# Patient Record
Sex: Female | Born: 1965 | Race: Black or African American | Hispanic: No | Marital: Single | Smoking: Current every day smoker
Health system: Southern US, Community
[De-identification: ages and names within clinical notes are randomized; demographics above are authoritative.]

## PROBLEM LIST (undated history)

## (undated) DIAGNOSIS — M1990 Unspecified osteoarthritis, unspecified site: Secondary | ICD-10-CM

## (undated) DIAGNOSIS — M549 Dorsalgia, unspecified: Secondary | ICD-10-CM

## (undated) DIAGNOSIS — E119 Type 2 diabetes mellitus without complications: Secondary | ICD-10-CM

## (undated) DIAGNOSIS — K219 Gastro-esophageal reflux disease without esophagitis: Secondary | ICD-10-CM

## (undated) DIAGNOSIS — F419 Anxiety disorder, unspecified: Secondary | ICD-10-CM

## (undated) DIAGNOSIS — I10 Essential (primary) hypertension: Secondary | ICD-10-CM

## (undated) DIAGNOSIS — E785 Hyperlipidemia, unspecified: Secondary | ICD-10-CM

## (undated) DIAGNOSIS — M6530 Trigger finger, unspecified finger: Secondary | ICD-10-CM

## (undated) DIAGNOSIS — F32A Depression, unspecified: Secondary | ICD-10-CM

## (undated) DIAGNOSIS — F329 Major depressive disorder, single episode, unspecified: Secondary | ICD-10-CM

## (undated) HISTORY — PX: JOINT REPLACEMENT: SHX530

## (undated) HISTORY — PX: TUBAL LIGATION: SHX77

---

## 2000-11-19 ENCOUNTER — Encounter: Admission: RE | Admit: 2000-11-19 | Discharge: 2000-11-19 | Payer: Self-pay | Admitting: Family Medicine

## 2002-07-24 ENCOUNTER — Encounter: Admission: RE | Admit: 2002-07-24 | Discharge: 2002-07-24 | Payer: Self-pay | Admitting: Family Medicine

## 2004-04-20 ENCOUNTER — Emergency Department (HOSPITAL_COMMUNITY): Admission: EM | Admit: 2004-04-20 | Discharge: 2004-04-20 | Payer: Self-pay | Admitting: Family Medicine

## 2009-06-03 ENCOUNTER — Emergency Department (HOSPITAL_COMMUNITY): Admission: EM | Admit: 2009-06-03 | Discharge: 2009-06-03 | Payer: Self-pay | Admitting: Emergency Medicine

## 2010-02-13 ENCOUNTER — Encounter: Payer: Self-pay | Admitting: Family Medicine

## 2010-03-07 ENCOUNTER — Ambulatory Visit: Payer: Self-pay | Admitting: Family Medicine

## 2010-03-07 DIAGNOSIS — M2550 Pain in unspecified joint: Secondary | ICD-10-CM | POA: Insufficient documentation

## 2010-03-07 DIAGNOSIS — R030 Elevated blood-pressure reading, without diagnosis of hypertension: Secondary | ICD-10-CM

## 2010-03-07 DIAGNOSIS — F17200 Nicotine dependence, unspecified, uncomplicated: Secondary | ICD-10-CM

## 2010-03-07 DIAGNOSIS — K6289 Other specified diseases of anus and rectum: Secondary | ICD-10-CM | POA: Insufficient documentation

## 2010-03-07 DIAGNOSIS — E669 Obesity, unspecified: Secondary | ICD-10-CM

## 2010-03-13 ENCOUNTER — Emergency Department (HOSPITAL_COMMUNITY): Admission: EM | Admit: 2010-03-13 | Discharge: 2010-03-13 | Payer: Self-pay | Admitting: Emergency Medicine

## 2010-08-05 ENCOUNTER — Encounter: Payer: Self-pay | Admitting: Family Medicine

## 2010-08-05 ENCOUNTER — Ambulatory Visit: Payer: Self-pay | Admitting: Family Medicine

## 2010-08-05 DIAGNOSIS — N898 Other specified noninflammatory disorders of vagina: Secondary | ICD-10-CM | POA: Insufficient documentation

## 2010-08-05 LAB — CONVERTED CEMR LAB
Chlamydia, DNA Probe: NEGATIVE
GC Probe Amp, Genital: NEGATIVE
Whiff Test: NEGATIVE

## 2010-08-13 ENCOUNTER — Encounter: Payer: Self-pay | Admitting: Family Medicine

## 2010-08-13 ENCOUNTER — Ambulatory Visit: Payer: Self-pay | Admitting: Family Medicine

## 2010-08-13 LAB — CONVERTED CEMR LAB
Cholesterol: 189 mg/dL (ref 0–200)
LDL Cholesterol: 114 mg/dL — ABNORMAL HIGH (ref 0–99)
Triglycerides: 118 mg/dL (ref <150)

## 2010-08-15 ENCOUNTER — Encounter: Payer: Self-pay | Admitting: Family Medicine

## 2010-08-18 ENCOUNTER — Ambulatory Visit: Payer: Self-pay | Admitting: Family Medicine

## 2010-11-20 ENCOUNTER — Ambulatory Visit: Payer: Self-pay | Admitting: Family Medicine

## 2010-11-20 DIAGNOSIS — M999 Biomechanical lesion, unspecified: Secondary | ICD-10-CM

## 2010-11-20 DIAGNOSIS — M25569 Pain in unspecified knee: Secondary | ICD-10-CM

## 2010-12-02 ENCOUNTER — Encounter: Payer: Self-pay | Admitting: *Deleted

## 2010-12-02 ENCOUNTER — Ambulatory Visit (HOSPITAL_COMMUNITY): Admission: RE | Admit: 2010-12-02 | Payer: Self-pay | Source: Home / Self Care | Admitting: Family Medicine

## 2010-12-31 ENCOUNTER — Encounter: Payer: Self-pay | Admitting: Family Medicine

## 2011-01-01 NOTE — Assessment & Plan Note (Signed)
Summary: NP,tcb   Vital Signs:  Patient profile:   45 year old female Height:      70 inches Weight:      299 pounds BMI:     43.06 BSA:     2.48 Temp:     98.6 degrees F Pulse rate:   78 / minute BP sitting:   137 / 90  Vitals Entered By: Jone Baseman CMA (March 07, 2010 1:44 PM) CC: Np-restablish care, rectal pain Is Patient Diabetic? No Pain Assessment Patient in pain? yes     Location: knees Intensity: 10   CC:  Np-restablish care and rectal pain.  History of Present Illness: here primarily to reestablish care.    rectal pain: main issue she wishes to discuss.  has noticed pressure that is intense - has occurred approximately 3-4 times and only happens about once every 3-4 months.  at first she thought maybe related to menstrual cycles but didn't happen with most recent cycle.  states the pain is sharp and causes sweating.  she feels like she needs to have a BM and then can't go.  once she did pass gass and felt much better thereafter. she does note she can feel when it is coming on.  typically the episodes last from 3-5 minutes.  she denies change in stool caliper.  denies any blood instool.  urination is normal, menstrual cycles regular.  she denies consipation or diarrhea  Habits & Providers  Alcohol-Tobacco-Diet     Alcohol drinks/day: 0     Tobacco Status: current     Tobacco Counseling: to quit use of tobacco products     Cigarette Packs/Day: 0.5     Year Started: since age 54     Year Quit: quit once for about 5 mo in 1992 and again in 2004 for 7 months  Exercise-Depression-Behavior     Drug Use: never  Current Medications (verified): 1)  Diclofenac Sodium 75 Mg Tbec (Diclofenac Sodium) .Marland Kitchen.. 1 By Mouth Two Times A Day For Arthritis Pains  Allergies (verified): No Known Drug Allergies  Past History:  Past medical, surgical, family and social histories (including risk factors) reviewed, and no changes noted (except as noted below).  Past Medical  History: s/p 6 NSVD at [facility], no complications arthritis? of knees and foot and wrist obesity  Past Surgical History: BTL 10/19/94  Family History: Reviewed history from 02/13/2010 and no changes required. Father - deceased age 48, s/p MI; HTN,  Mother - deceased at age 64 yo, type II DM ; HTN, CHF Kids healthy  Sister - DM2, HTN Brother - HTN other sibs healthy  no cancers in family  Social History: Reviewed history from 02/13/2010 and no changes required. Lives with daughters Melynda [place], [place]).  Has 4 other children (Shaqueena Gass, [place], [place]etta Hew)  - they live nearby. 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  denies chest pains, abdominal pains, shortness of breath, rash.  does endorce occasional heartburn with certain foods.   Physical Exam  General:  alert, well-developed, and well-hydrated.  obese Lungs:  Normal respiratory effort, chest expands symmetrically. Lungs are clear to auscultation, no crackles or wheezes. Heart:  Normal rate and regular rhythm. S1 and S2 normal without gallop, murmur, click,  rub or other extra sounds. Abdomen:  Bowel sounds positive,abdomen soft and non-tender without masses, organomegaly or hernias noted. Rectal:  No external abnormalities noted. Normal sphincter tone. No rectal masses or tenderness. Psych:  Cognition and judgment appear intact. Alert and cooperative with normal attention span and concentration. No apparent delusions, illusions, hallucinations   Impression & Recommendations:  Problem # 1:  RECTAL PAIN (ZOX-096.04) Assessment New unclear etiology at this point.  examination unrevealing.  ? gas pains.  monitor symptoms, try to come when having symptoms for repeat examination.   Problem # 2:   Preventive Health Care (ICD-V70.0) Assessment: Comment Only given handout for mammogram  Problem # 3:  TOBACCO ABUSE (ICD-305.1) Assessment: New given handout on smoking cessation classes. she is interested in quitting  Problem # 4:  ELEVATED BLOOD PRESSURE WITHOUT DIAGNOSIS OF HYPERTENSION (ICD-796.2) Assessment: New monitor.  has strong famiily hx.  may need treatment.  check labs at future appt as well - cholsterol, cbc, cmet, ?tsh  Complete Medication List: 1)  Diclofenac Sodium 75 Mg Tbec (Diclofenac sodium) .Marland Kitchen.. 1 by mouth two times a day for arthritis pains  Patient Instructions: 1)  Please follow up with me sometime in the next month or so to see how your new arthritis medicine is doing and to get your pap smear done.  2)  Please consider the smoking cessation classes here. 3)  The attached paper you can call to set up your mammogram. 4)  If anything changes with your rectal pain, please let me know. 5)  It was nice to meet you today.  Prescriptions: DICLOFENAC SODIUM 75 MG TBEC (DICLOFENAC SODIUM) 1 by mouth two times a day for arthritis pains  #60 x 3   Entered and Authorized by:   Ancil Boozer  MD   Signed by:   Ancil Boozer  MD on 03/07/2010   Method used:   Electronically to        [facility] [phone]* (retail)       [address]       [place], [place]  [address]       Ph: [phone]       Fax: [phone]   RxID:   [id number]   Prevention & Chronic Care Immunizations   Influenza vaccine: Not documented    Tetanus booster: Not documented    Pneumococcal vaccine: Not documented  Other Screening   Pap smear: Not documented    Mammogram: Not documented   Smoking status: current  (03/07/2010)  Lipids   Total Cholesterol: Not documented   LDL: Not documented   LDL Direct: Not documented   HDL: Not documented   Triglycerides: Not documented

## 2011-01-01 NOTE — Assessment & Plan Note (Signed)
Summary: cpe/pap,tcb   Vital Signs:  Patient profile:   45 year old female Weight:      294 pounds Temp:     98.2 degrees F oral Pulse rate:   87 / minute Pulse rhythm:   regular BP sitting:   137 / 100  (left arm) Cuff size:   large  Vitals Entered By: Loralee Pacas CMA (August 05, 2010 3:21 PM)  History of Present Illness: Pt. states that her knees have been bothering her a lot.  She says the diclofenac Dr. Sandi Mealy gave her at her last visit did not help her pain.  She says that she had seen the dentist and had a tooth pulled, and had some vicodin afterwards, and that and the diclofenac helped her knee pain.  She has had no inciting injury, and has had knee pain for many years, but it is worse lately.  Says it is on the inside of the knees, hurts worst from sitting to standing.  Makes walking difficult.   Pt. says she has no other complaints.  Pt says she is trying to watch her diet and quit smoking, but has not lost any weight or been able to completely stop smoking.    Allergies: No Known Drug Allergies  Family History: Reviewed history from 03/07/2010 and no changes required. Father - deceased age 70, s/p MI; HTN,  Mother - deceased at age 35 yo, type II DM ; HTN, CHF Kids healthy  Sister - DM2, HTN Brother - HTN other sibs healthy  no cancers in family  Social History: Reviewed history from 03/07/2010 and no changes required. Lives with daughters Melynda Keller, [place]).  Has 4 other children (Shaqueena Kemmerer, [place], Glenetta Hew)  - they live nearby. 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  The patient denies anorexia, chest pain, syncope, headaches, and abdominal pain.    Physical Exam  General:  Well-developed,well-nourished,in no acute distress; alert,appropriate and  cooperative throughout examination Eyes:  No corneal or conjunctival inflammation noted. EOMI. Perrla.  Mouth:  Oral mucosa and oropharynx without lesions or exudates.  Teeth in good repair. Neck:  No deformities, masses, or tenderness noted. Breasts:  No mass, nodules, thickening, tenderness, bulging, retraction, inflamation, nipple discharge or skin changes noted.   Lungs:  Normal respiratory effort, chest expands symmetrically. Lungs are clear to auscultation, no crackles or wheezes. Heart:  Normal rate and regular rhythm. S1 and S2 normal without gallop, murmur, click, rub or other extra sounds. Abdomen:  Obese, bowel sounds positive,abdomen soft and non-tender without masses, organomegaly or hernias noted. Genitalia:  Normal introitus for age, no external lesions,  minimal white vaginal discharge, mucosa pink and moist, no vaginal or cervical lesions, no vaginal atrophy, no friaility or hemorrhage, normal uterus size and position, no adnexal masses or tenderness, bimanual exam limited by body habitus.  Msk:  Bilateral knees: no joint swelling, no joint warmth, no redness over joints, + joint tenderness and + crepitus.   Pulses:  R and L carotid,radial,dorsalis pedis and posterior tibial pulses are full and equal bilaterally   Impression & Recommendations:  Problem # 1:  Gynecological examination-routine (ICD-V72.31) Normal female exam.  Will send Pap and GC/Chlamidya cultures.   Problem # 2:  PAIN IN JOINT, MULTIPLE SITES (ICD-719.49) Pt. likely has  osteoarthritis, in part due to her weight.  Will let her try Mobic for a limited time to see if she has relief.   Problem # 3:  ELEVATED BLOOD PRESSURE WITHOUT DIAGNOSIS OF HYPERTENSION (ICD-796.2) Pt. again with boarderline BP today.  I have asked her to keep a BP log and bring it in to her next visit to assess if she needs to be on meds.   Problem # 4:  OBESITY, UNSPECIFIED (ICD-278.00) I think this is contributing to both her joint pain  and her blood pressure.  Pt. seems resistant to acknowledge this, saying her knees hurt her "before she gained weight."  I advised diet and exercise.  Will also check lipids and fasting blood sugar.   Future Orders: Lipid-[facility] ([id number]) ... 08/06/2011 Glucose Cap-[facility] (56213) ... 08/06/2011  Complete Medication List: 1)  Meloxicam 15 Mg Tabs (Meloxicam) .... Take one by mouth daily for arthritis pain  Other Orders: Pap Sm[facility] ([id number]) [facility] GC/Chlamydia-[facility] ([id number])  Patient Instructions: 1)  It was nice to meet you today.  I think we need to watch your blood pressure closely, so I want you to check it a few times a week and write them down.  Bring in the readings to show me at your next visit.   2)  I want to check your cholesterol and blood sugar, please make a morning lab appointment and do not eat anything before it.  Prescriptions: MELOXICAM 15 MG TABS (MELOXICAM) take one by mouth daily for arthritis pain  #30 x 0   Entered and Authorized by:   Ardyth Gal MD   Signed by:   Ardyth Gal MD on 08/05/2010   Method used:   Electronically to        [id number] (retail)       [address]       [place], [place]  [address]       Ph: [phone] or [phone]       Fax: [phone]   RxID:   [id number]   Laboratory Results  Date/Time Received: August 05, 2010 4:43 PM  Date/Time Reported: August 05, 2010 4:55 PM  Wet [place] Source: vag WBC/hpf: 1-5 Bacteria/hpf: 2+  Cocci Clue cells/hpf: few  Negative whiff Yeast/hpf: none Trichomonas/hpf: none Comments: many RBC's ...............test performed by......Marland KitchenBonnie A. [place], MLS (ASCP)cm

## 2011-01-01 NOTE — Miscellaneous (Signed)
Summary: BP check  Clinical Lists Changes   patient in for labs and requested BP check., BP checked manually using large adult cuff.  BP LA 140/92 , RA 140/100 pulse 84. will send  message to MD . patient plans to schedule appointment for follow up.  Theresia Lo RN  August 13, 2010 8:53 AM

## 2011-01-01 NOTE — Miscellaneous (Signed)
Summary: order for mammogram/ts  Clinical Lists Changes faxed order for screening mammogram to [facility].Arlyss Repress CMA,  December 02, 2010 8:47 AM  Orders: Added new Test order of Mammogram (Screening) (Mammo) - Signed

## 2011-01-01 NOTE — Miscellaneous (Signed)
Summary: medical history   Clinical Lists Changes  Observations: Added new observation of FAMILY HX: Father - deceased age 47, s/p MI; HTN, Mother - deceased at age 92 yo, type II DM ; HTN (02/13/2010 14:01) Added new observation of SOCIAL HX: Lives with 15 yo mother and four of her six children - daughters aged 95, 28, 29 and 71 ([place], [place] ...)  Her two sons, ago 73 and 50, live with her cousin nearby and she sees them often.; Smokes 1/2 ppd; ETOH - reports 1-2 drinks daily, denies drugs some college education (02/13/2010 14:01) Added new observation of PAST MED HX: s/p 6 NSVD at [facility], no complications arthritis? BTL 10/19/94 (02/13/2010 14:01)     Past, Family, and Social History Past History: s/p 6 NSVD at [facility], no complications arthritis? BTL 10/19/94 Family History: Father - deceased age 77, s/p MI; HTN, Mother - deceased at age 60 yo, type II DM ; HTN Social History: Lives with 24 yo mother and four of her six children - daughters aged 29, 36, 63 and 58 ([place], [place] ...)  Her two sons, ago 24 and 65, live with her cousin nearby and she sees them often.; Smokes 1/2 ppd; ETOH - reports 1-2 drinks daily, denies drugs some college education

## 2011-01-01 NOTE — Assessment & Plan Note (Signed)
Summary: side pain,df   Vital Signs:  Patient profile:   45 year old female Weight:      289 pounds Temp:     98.8 degrees F oral Pulse rate:   89 / minute BP sitting:   122 / 86  (left arm) Cuff size:   large  Vitals Entered By: Loralee Pacas CMA (November 20, 2010 1:36 PM)  History of Present Illness: 45 yo female her ewith right side pain for a couple weeks now.  Does not remember exactly when it started but is starting to affect her ADL's some.  Pt states it only hurst with movement.  Pt states that it statys in the same area and hurts more when she lifts her arm up.  No loss of sensation or strength.  Denies numbness or swelling in extremity or redness, no rash.  Denies fever, chills, nausea, vomiting, diarrhea or constipation   Pt also has right knee pain-  Hurts when she walks has had a lot of x-rays and states she does have arthritis.  Pt is not taking any medications at this time.  Pt has more trouble with stairs.  Never have given out on her no swelling loss of sensation, redness some cracking.   Current Medications (verified): 1)  Tramadol Hcl 50 Mg Tabs (Tramadol Hcl) .Marland Kitchen.. 1 Tab By Mouth Twice Daily As Needed For Pain  Allergies (verified): No Known Drug Allergies  Review of Systems       denies fever, chills, nausea, vomiting, diarrhea or constipation see hpi  Physical Exam  General:  Well-developed,well-nourished,in no acute distress; alert,appropriate and cooperative throughout examination Eyes:  No corneal or conjunctival inflammation noted. EOMI. Perrla.  Mouth:  Oral mucosa and oropharynx without lesions or exudates.  Teeth in good repair. Lungs:  Normal respiratory effort, chest expands symmetrically. Lungs are clear to auscultation, no crackles or wheezes. Heart:  Normal rate and regular rhythm. S1 and S2 normal without gallop, murmur, click, rub or other extra sounds. Msk:  right knee, vmo weak on right, does have some mild DJD changes, full ROM , good  peripheral pulses, ACL, MCL, PCL, LCL in tact  OMT findings Right 5 th rib sutck in inhalation.  Pulses:  R and L carotid,radial,dorsalis pedis and posterior tibial pulses are full and equal bilaterally Extremities:  no edema   Impression & Recommendations:  Problem # 1:  PATELLO-FEMORAL SYNDROME (ICD-719.46)  Pt is doing very well but has some muscle fatigue.  Will do cycling and was given exercises to try to help strengthen muscle to help knee, if still giving problem at follow up will do injection in knee at that time. f/u in 6 weeks Her updated medication list for this problem includes:    Tramadol Hcl 50 Mg Tabs (Tramadol hcl) .Marland Kitchen... 1 tab by mouth twice daily as needed for pain  Orders: [facility]- Est Level  3 (99213)  Problem # 2:  NONALLOPATHIC LESION OF RIB CAGE NEC (ICD-739.8) after verbal cxonsent given pt with muscle energy had t% on right put in alignment with resolvement of pain.  Orders: [facility]- Est Level  3 (99213) OMT 1-2 [place] (04540)  Complete Medication List: 1)  Tramadol Hcl 50 Mg Tabs (Tramadol hcl) .Marland Kitchen.. 1 tab by mouth twice daily as needed for pain  Patient Instructions: 1)  nice to meet you 2)  For your knee I want you to try to bike 3 times a week to help build that muscle.  3)  I want  you to do the other exercises as well two times a day  4)  I want you to take tylenol 1 pill three times a day all the time 5)  I am giving you tramadol to try when the pain is very bad.  You can use it two times a day as needed It may make you sleepy 6)  I want to see you again in 6 weeks.  Prescriptions: TRAMADOL HCL 50 MG TABS (TRAMADOL HCL) 1 tab by mouth twice daily as needed for pain  #60 x 0   Entered and Authorized by:   Antoine Primas DO   Signed by:   Antoine Primas DO on 11/20/2010   Method used:   [facility]  [id number]       [address]       [place], [place]  [address]       Ph: [phone] or  [phone]       Fax: [phone]   RxID:   [id number]    Orders Added: 1)  [facility]- Est Level  3 [16606] 2)  OMT 1-2 [place] [30160]

## 2011-01-01 NOTE — Assessment & Plan Note (Signed)
Summary: BP CHECK/KH  Nurse Visit patient was in office with daughter this AM and ask for BP to be checked. advised patient to go out front and register and be signed in under nurse visit. she did this but when RN went out to call patient back she was gone.Marland Kitchen arrived no charge. Theresia Lo RN  August 18, 2010 3:14 PM   Allergies: No Known Drug Allergies  Orders Added: 1)  No Charge Patient Arrived (NCPA0) [NCPA0]

## 2011-01-01 NOTE — Letter (Signed)
Summary: Generic Letter  [facility]  [address]   [place], [place] [address]   Phone: [phone]  Fax: [phone]    08/15/2010  [facility] [address] [place], [place]  [address]  Dear Ms. Lastinger,  Your Pap smear and gynecologic labs were normal.  However, your cholesterol is slightly high.  Please make an appointment to discuss yousr cholesterol and your blood pressure.     Sincerely,   Ardyth Gal MD  Appended Document: Generic Letter mailed

## 2011-02-20 ENCOUNTER — Emergency Department (HOSPITAL_COMMUNITY): Payer: No Typology Code available for payment source

## 2011-02-20 ENCOUNTER — Emergency Department (HOSPITAL_COMMUNITY)
Admission: EM | Admit: 2011-02-20 | Discharge: 2011-02-20 | Disposition: A | Discharge status: Home / Self Care | Payer: No Typology Code available for payment source | Attending: Emergency Medicine | Admitting: Emergency Medicine

## 2011-02-20 DIAGNOSIS — M549 Dorsalgia, unspecified: Secondary | ICD-10-CM | POA: Insufficient documentation

## 2011-02-20 DIAGNOSIS — S8000XA Contusion of unspecified knee, initial encounter: Secondary | ICD-10-CM | POA: Insufficient documentation

## 2011-02-20 DIAGNOSIS — M25569 Pain in unspecified knee: Secondary | ICD-10-CM | POA: Insufficient documentation

## 2011-02-26 ENCOUNTER — Ambulatory Visit: Payer: Self-pay | Admitting: Family Medicine

## 2011-05-11 ENCOUNTER — Ambulatory Visit: Payer: Self-pay | Admitting: Family Medicine

## 2011-05-14 ENCOUNTER — Ambulatory Visit: Payer: Self-pay | Admitting: Family Medicine

## 2012-01-04 ENCOUNTER — Encounter: Payer: Self-pay | Admitting: Family Medicine

## 2012-01-04 ENCOUNTER — Ambulatory Visit (INDEPENDENT_AMBULATORY_CARE_PROVIDER_SITE_OTHER): Payer: Self-pay | Admitting: Family Medicine

## 2012-01-04 VITALS — BP 120/81 | HR 102 | Temp 98.0°F | Ht 70.0 in | Wt 295.0 lb

## 2012-01-04 DIAGNOSIS — M2550 Pain in unspecified joint: Secondary | ICD-10-CM

## 2012-01-04 DIAGNOSIS — F17200 Nicotine dependence, unspecified, uncomplicated: Secondary | ICD-10-CM

## 2012-01-04 DIAGNOSIS — M999 Biomechanical lesion, unspecified: Secondary | ICD-10-CM

## 2012-01-04 LAB — COMPREHENSIVE METABOLIC PANEL
AST: 16 U/L (ref 0–37)
Albumin: 4.2 g/dL (ref 3.5–5.2)
Alkaline Phosphatase: 134 U/L — ABNORMAL HIGH (ref 39–117)
BUN: 9 mg/dL (ref 6–23)
Calcium: 9.4 mg/dL (ref 8.4–10.5)
Chloride: 104 mEq/L (ref 96–112)
Potassium: 4.1 mEq/L (ref 3.5–5.3)
Sodium: 140 mEq/L (ref 135–145)
Total Protein: 7 g/dL (ref 6.0–8.3)

## 2012-01-04 LAB — RHEUMATOID FACTOR: Rhuematoid fact SerPl-aCnc: 83 IU/mL — ABNORMAL HIGH (ref <=14)

## 2012-01-04 MED ORDER — BACLOFEN 20 MG PO TABS: 20.0000 mg | ORAL_TABLET | Freq: Every evening | ORAL | Status: AC | PRN

## 2012-01-04 MED ORDER — DICLOFENAC SODIUM 75 MG PO TBEC: 75.0000 mg | DELAYED_RELEASE_TABLET | Freq: Two times a day (BID) | ORAL | Status: DC

## 2012-01-04 MED ORDER — NICOTINE POLACRILEX 4 MG MT GUM: 4.0000 mg | CHEWING_GUM | OROMUCOSAL | Status: AC | PRN

## 2012-01-04 NOTE — Patient Instructions (Addendum)
Melanie Phillips,   Thank you for coming to see me today.   For knee pain: weight loss is key. Strengthening muscle around the knee. Daily exercise 30 minutes (and work up) start with waking. Take the diclofenac. Use ice and rest it for the next 24 hrs.  Please call if the pain gets much worse. I will send you to physical therapy.  For the other joints: check blood work, the diclofenac is for this too.  For your side: consistent with trigger point pain. Deep massage can be helpful as I demonstrated in clinic. Ice, heat. Diclofenac can help this as well. You may take a muscle relaxer at night. May benefit from trigger point injection in the future (see below).  Smoking: nicotine gum, slowly wean to off.  Smoking cessation support: smoking cessation hotline: 1-800-QUIT-NOW.  Here is the number to the smoking cessation classes at [facility]: 825-854-7043.  Or you can register on line through [place] [facility] website, classes and support groups.   Plan on coming back in 6 weeks for f/u pain control.  Dr. Armen Pickup    Trigger Point Therapy Your exam shows your pain is coming from one or more trigger points. These points are places where the muscles of the neck, shoulder, back, and legs are tender to touch and cause pain, spasm, and fatigue. This condition can cause headaches, painful neck spasms, and low back pain. Trigger points can be thought of as weak areas of muscle that spasm and form hard knots when under chronic strain or stress. Treatment of trigger points may include medicines to reduce pain and inflammation and relax the muscles. Physical therapy can include several techniques. Deep friction massage over the tender areas can often help relieve pain and stiffness. Relaxation exercises and stretching may be helpful; cervical and lumbar traction can be used in severe cases. Cold therapy and hot packs have both proven helpful. Injection therapy with saline, anesthetics, or cortisone medicines, has also been very  successful at reducing symptoms. Two or more injections are often needed over several weeks to gain the greatest benefit. Call your doctor to arrange for further treatment as recommended. Document Released: 12/24/2004 Document Revised: 07/29/2011 Document Reviewed: 11/16/2005 [facility] Patient Information 2012 [place], [place].

## 2012-01-04 NOTE — Progress Notes (Signed)
  Subjective:    Patient ID: Melanie Phillips, female    DOB: 04-29-1966, 46 y.o.   MRN: [id number]  HPI 46 yo F presents to meet her new MD and discuss the following:  1. Multiple arthralgias: -knee pain. Present for 12 years after slipping in her yard.  R>L. Had some arthritis on x-rays a few years ago. Sleeps with pillows between and under her knees. R knee swell sometimes. R keen feel like it gives out. Some locking and popping. Pain is better with heating pad and moving around. It is worse with sitting for extended periods of time.   -R ankle pain. Chronic x 12 years following slip in yard. No recent injury. Pain located in lateral ankle. No pain today.   -Bilateral wrist pain: Present for 2 yrs. R wrist. Lateral wrist. Burning sensation. Better with blowing on it. Nothing makes it worse. L wrist pain also lateral over 4th and 5th digits. Better with flexion and worse with holding and gripping.   2. R side pain: constant, 7/10 at worse and 4/10 at best. Feeling achy. No N/V, dysuria, fever, chills. Appetite good. Had 24 hrs of diarrhea last week.   3.Tobacoo use: smokes 1/2 PPD x 10 yrs. Lives with 2 daughters (17 and 31)  who do not smoke. She want to quit. She admit to slight dyspnea when walking up stair. She denies cough.    Review of Systems As per HPI     Objective:   Physical Exam BP 120/81  Pulse 102  Temp(Src) 98 F (36.7 C) (Oral)  Ht 5\' 10"  (1.778 m)  Wt 295 lb (133.811 kg)  BMI 42.33 kg/m2  LMP 12/28/2011 General appearance: alert, cooperative and no distress Neck: no adenopathy, no carotid bruit, no JVD, supple, symmetrical, trachea midline and thyroid not enlarged, symmetric, no tenderness/mass/nodules Lungs: clear to auscultation bilaterally Heart: regular rate and rhythm, S1, S2 normal, no murmur, click, rub or gallop Abdomen: soft, non-tender; bowel sounds normal; no masses,  no organomegaly MSK: palpable fibrous mass R lateral thigh. Non tender. R back  soreness, small palpable trigger point over maximal point of soreness.  Knee: bilateral crepitus R>L. No edema. Full ROM.  R ankle: Mild swelling posterior to medial malleolus. Full ROM. Full strength in all 4 planes.  R wrist: non tender, no edema. 2+ radial pulse. Skin warm and strength full.  L wrist: soreness over dorsum of 4th and 5th digit.  no edema. 2+ radial pulse. Skin warm and strength full.        Assessment & Plan:

## 2012-01-14 NOTE — Assessment & Plan Note (Signed)
A: ready to quit. P: nicotine gum, slowly wean to off.  Smoking cessation support: smoking cessation hotline: 1-800-QUIT-NOW.  Here is the number to the smoking cessation classes at [facility]: 423-580-9160.  Or you can register on line through [place] [facility] website, classes and support groups.

## 2012-01-14 NOTE — Assessment & Plan Note (Addendum)
A: chronic pains in a young concerning for autoimmune/inflammatory arthritis.  P: -diclofenac -blood work: uric acid, ESR, RF and ANA

## 2012-01-14 NOTE — Assessment & Plan Note (Addendum)
A: this is chronic, has been manipulated in the past with temporary relief. Palpable trigger point on exam.  P: -diclofenac.  -baclofen -massage/heat -f/u in 6 weeks for possible trigger point injection.

## 2012-04-14 ENCOUNTER — Ambulatory Visit (INDEPENDENT_AMBULATORY_CARE_PROVIDER_SITE_OTHER): Payer: No Typology Code available for payment source | Admitting: Family Medicine

## 2012-04-14 DIAGNOSIS — E669 Obesity, unspecified: Secondary | ICD-10-CM

## 2012-04-14 NOTE — Progress Notes (Signed)
Patient ID: Melanie Phillips, female   DOB: 03-Dec-1965, 46 y.o.   MRN: [id number] [facility]

## 2012-05-24 ENCOUNTER — Emergency Department (HOSPITAL_COMMUNITY)
Admission: EM | Admit: 2012-05-24 | Discharge: 2012-05-25 | Disposition: A | Discharge status: Home / Self Care | Payer: Medicaid Other | Attending: Emergency Medicine | Admitting: Emergency Medicine

## 2012-05-24 ENCOUNTER — Encounter (HOSPITAL_COMMUNITY): Payer: Self-pay | Admitting: Emergency Medicine

## 2012-05-24 DIAGNOSIS — M129 Arthropathy, unspecified: Secondary | ICD-10-CM | POA: Insufficient documentation

## 2012-05-24 DIAGNOSIS — I10 Essential (primary) hypertension: Secondary | ICD-10-CM | POA: Insufficient documentation

## 2012-05-24 DIAGNOSIS — M5431 Sciatica, right side: Secondary | ICD-10-CM

## 2012-05-24 DIAGNOSIS — F17200 Nicotine dependence, unspecified, uncomplicated: Secondary | ICD-10-CM | POA: Insufficient documentation

## 2012-05-24 DIAGNOSIS — M5430 Sciatica, unspecified side: Secondary | ICD-10-CM | POA: Insufficient documentation

## 2012-05-24 HISTORY — DX: Dorsalgia, unspecified: M54.9

## 2012-05-24 HISTORY — DX: Unspecified osteoarthritis, unspecified site: M19.90

## 2012-05-24 HISTORY — DX: Essential (primary) hypertension: I10

## 2012-05-24 NOTE — ED Notes (Signed)
Pt states she has been having back pain for 3 days. Hx of arthritis and HTN and chronic back pain. No trauma. Sharp pain. No nerve tingling, etc..Marland Kitchen

## 2012-05-24 NOTE — ED Notes (Signed)
Pt attempted to obtain urine sample but unable to do so at this time

## 2012-05-24 NOTE — ED Notes (Signed)
[id number]<JY> Expected date:<BR> Expected time: 9:21 PM<BR> Means of arrival:<BR> Comments:<BR> [id number] - 45yoF Back spasms

## 2012-05-25 MED ORDER — NAPROXEN 500 MG PO TABS: 500.0000 mg | ORAL_TABLET | Freq: Two times a day (BID) | ORAL | Status: DC

## 2012-05-25 MED ORDER — DIAZEPAM 5 MG PO TABS: 5.0000 mg | ORAL_TABLET | Freq: Three times a day (TID) | ORAL | Status: DC | PRN

## 2012-05-25 MED ORDER — DIAZEPAM 5 MG PO TABS
5.0000 mg | ORAL_TABLET | Freq: Once | ORAL | Status: AC
  Administered 2012-05-25: 5 mg via ORAL
  Filled 2012-05-25: qty 1

## 2012-05-25 MED ORDER — OXYCODONE-ACETAMINOPHEN 5-325 MG PO TABS
2.0000 | ORAL_TABLET | Freq: Once | ORAL | Status: AC
  Administered 2012-05-25: 2 via ORAL
  Filled 2012-05-25: qty 2

## 2012-05-25 MED ORDER — NAPROXEN 500 MG PO TABS
500.0000 mg | ORAL_TABLET | Freq: Once | ORAL | Status: AC
  Administered 2012-05-25: 500 mg via ORAL
  Filled 2012-05-25: qty 1

## 2012-05-25 MED ORDER — OXYCODONE-ACETAMINOPHEN 5-325 MG PO TABS: 2.0000 | ORAL_TABLET | ORAL | Status: DC | PRN

## 2012-05-25 NOTE — ED Provider Notes (Signed)
History     CSN: [id number]  Arrival date & time 05/24/12  2128   First MD Initiated Contact with Patient 05/24/12 2344      Chief Complaint  Patient presents with  . Back Pain    (Consider location/radiation/quality/duration/timing/severity/associated sxs/prior treatment) HPI 46 year old female presents to the emergency apartment complaining of low back pain radiating into her right buttock and down her right leg. Patient denies any bowel or bladder problems. No trauma, no known injury. Patient reports symptoms have been ongoing for the last 3 days. She cannot get comfortable. Patient describes pain as a burning shooting sensation with certain positions. Worse with sitting. Past Medical History  Diagnosis Date  . Hypertension   . Arthritis   . Back pain     No past surgical history on file.  No family history on file.  History  Substance Use Topics  . Smoking status: Current Everyday Smoker -- 0.5 packs/day    Types: Cigarettes  . Smokeless tobacco: Not on file  . Alcohol Use: Not on file    OB History    Grav Para Term Preterm Abortions TAB SAB Ect Mult Living                  Review of Systems  All other systems reviewed and are negative.   other than listed in history of present illness  Allergies  Review of patient's allergies indicates no known allergies.  Home Medications   Current Outpatient Rx  Name Route Sig Dispense Refill  . DIAZEPAM 5 MG PO TABS Oral Take 1 tablet (5 mg total) by mouth every 8 (eight) hours as needed (muscle spasm). 15 tablet 0  . NAPROXEN 500 MG PO TABS Oral Take 1 tablet (500 mg total) by mouth 2 (two) times daily. 30 tablet 0  . OXYCODONE-ACETAMINOPHEN 5-325 MG PO TABS Oral Take 2 tablets by mouth every 4 (four) hours as needed for pain. 20 tablet 0    BP 141/91  Pulse 78  Temp 98.2 F (36.8 C) (Oral)  Resp 16  SpO2 99%  Physical Exam  Nursing note and vitals reviewed. Constitutional: She is oriented to person,  place, and time. She appears well-developed and well-nourished. She appears distressed.       Morbidly obese, uncomfortable appearing  HENT:  Head: Normocephalic and atraumatic.  Right Ear: External ear normal.  Left Ear: External ear normal.  Nose: Nose normal.  Mouth/Throat: Oropharynx is clear and moist.  Eyes: Conjunctivae and EOM are normal. Pupils are equal, round, and reactive to light.  Neck: Normal range of motion. Neck supple. No JVD present. No tracheal deviation present. No thyromegaly present.  Cardiovascular: Normal rate, regular rhythm, normal heart sounds and intact distal pulses.  Exam reveals no gallop and no friction rub.   No murmur heard. Pulmonary/Chest: Effort normal and breath sounds normal. No stridor. No respiratory distress. She has no wheezes. She has no rales. She exhibits no tenderness.  Abdominal: Soft. Bowel sounds are normal. She exhibits no distension and no mass. There is no tenderness. There is no rebound and no guarding.  Musculoskeletal: Normal range of motion. She exhibits tenderness ( tenderness with palpation of right buttock and right SI joint with reproduction of pain. Positive straight leg raise on right). She exhibits no edema.  Lymphadenopathy:    She has no cervical adenopathy.  Neurological: She is alert and oriented to person, place, and time. She has normal reflexes. She displays normal reflexes. No cranial nerve deficit.  She exhibits normal muscle tone. Coordination normal.  Skin: Skin is dry. No rash noted. She is not diaphoretic. No erythema. No pallor.  Psychiatric: She has a normal mood and affect. Her behavior is normal. Judgment and thought content normal.    ED Course  Procedures (including critical care time)  Labs Reviewed - No data to display No results found.   1. Sciatica of right side       MDM  46 year old female with symptoms that seem consistent with sciatica. Will place on short course of Valium Percocet and  Naprosyn. Patient to followup with [place] in practice clinic for further workup and treatment.       Olivia Mackie, MD 05/25/12 [phone]

## 2012-05-25 NOTE — Discharge Instructions (Signed)
Take medications as prescribed. Followup with your doctor for recheck in one week. Return to emergency department for loss of bowel or bladder control, worsening pain or weakness or other new concerning symptoms.  Sciatica Sciatica is a weakness and/or changes in sensation (tingling, jolts, hot and cold, numbness) along the path the sciatic nerve travels. Irritation or damage to lumbar nerve roots is often also referred to as lumbar radiculopathy.  Lumbar radiculopathy (Sciatica) is the most common form of this problem. Radiculopathy can occur in any of the nerves coming out of the spinal cord. The problems caused depend on which nerves are involved. The sciatic nerve is the large nerve supplying the branches of nerves going from the hip to the toes. It often causes a numbness or weakness in the skin and/or muscles that the sciatic nerve serves. It also may cause symptoms (problems) of pain, burning, tingling, or electric shock-like feelings in the path of this nerve. This usually comes from injury to the fibers that make up the sciatic nerve. Some of these symptoms are low back pain and/or unpleasant feelings in the following areas:  From the mid-buttock down the back of the leg to the back of the knee.   And/or the outside of the calf and top of the foot.   And/or behind the inner ankle to the sole of the foot.  CAUSES   Herniated or slipped disc. Discs are the little cushions between the bones in the back.   Pressure by the piriformis muscle in the buttock on the sciatic nerve (Piriformis Syndrome).   Misalignment of the bones in the lower back and buttocks (Sacroiliac Joint Derangement).   Narrowing of the spinal canal that puts pressure on or pinches the fibers that make up the sciatic nerve.   A slipped vertebra that is out of line with those above or beneath it.   Abnormality of the nervous system itself so that nerve fibers do not transmit signals properly, especially to feet and  calves (neuropathy).   Tumor (this is rare).  Your caregiver can usually determine the cause of your sciatica and begin the treatment most likely to help you. TREATMENT  Taking over-the-counter painkillers, physical therapy, rest, exercise, spinal manipulation, and injections of anesthetics and/or steroids may be used. Surgery, acupuncture, and Yoga can also be effective. Mind over matter techniques, mental imagery, and changing factors such as your bed, chair, desk height, posture, and activities are other treatments that may be helpful. You and your caregiver can help determine what is best for you. With proper diagnosis, the cause of most sciatica can be identified and removed. Communication and cooperation between your caregiver and you is essential. If you are not successful immediately, do not be discouraged. With time, a proper treatment can be found that will make you comfortable. HOME CARE INSTRUCTIONS   If the pain is coming from a problem in the back, applying ice to that area for 15 to 20 minutes, 3 to 4 times per day while awake, may be helpful. Put the ice in a plastic bag. Place a towel between the bag of ice and your skin.   You may exercise or perform your usual activities if these do not aggravate your pain, or as suggested by your caregiver.   Only take over-the-counter or prescription medicines for pain, discomfort, or fever as directed by your caregiver.   If your caregiver has given you a follow-up appointment, it is very important to keep that appointment. Not keeping the  appointment could result in a chronic or permanent injury, pain, and disability. If there is any problem keeping the appointment, you must call back to this facility for assistance.  SEEK IMMEDIATE MEDICAL CARE IF:   You experience loss of control of bowel or bladder.   You have increasing weakness in the trunk, buttocks, or legs.   There is numbness in any areas from the hip down to the toes.   You  have difficulty walking or keeping your balance.   You have any of the above, with fever or forceful vomiting.  Document Released: 11/10/2001 Document Revised: 11/05/2011 Document Reviewed: 06/29/2008 [facility] Patient Information 2012 [place], [place].

## 2012-05-27 ENCOUNTER — Encounter (HOSPITAL_COMMUNITY): Payer: Self-pay | Admitting: *Deleted

## 2012-05-31 ENCOUNTER — Ambulatory Visit (INDEPENDENT_AMBULATORY_CARE_PROVIDER_SITE_OTHER): Payer: Medicaid Other | Admitting: Family Medicine

## 2012-05-31 ENCOUNTER — Encounter: Payer: Self-pay | Admitting: Family Medicine

## 2012-05-31 VITALS — BP 136/84 | HR 93 | Temp 97.9°F | Ht 70.0 in | Wt 305.0 lb

## 2012-05-31 DIAGNOSIS — M5430 Sciatica, unspecified side: Secondary | ICD-10-CM | POA: Insufficient documentation

## 2012-05-31 MED ORDER — CYCLOBENZAPRINE HCL 5 MG PO TABS: 5.0000 mg | ORAL_TABLET | Freq: Three times a day (TID) | ORAL | Status: DC | PRN

## 2012-05-31 MED ORDER — OXYCODONE-ACETAMINOPHEN 5-325 MG PO TABS: 1.0000 | ORAL_TABLET | ORAL | Status: DC | PRN

## 2012-05-31 MED ORDER — NAPROXEN 500 MG PO TABS: 500.0000 mg | ORAL_TABLET | Freq: Two times a day (BID) | ORAL | Status: DC

## 2012-05-31 NOTE — Progress Notes (Addendum)
Patient ID: Melanie Phillips, female   DOB: 1966-05-17, 46 y.o.   MRN: [id number]  HPI: 46 year old female who presents to follow up after an ED visit for back pain, where she was diagnosed with sciatica. She has had the pain for 1.5 weeks. It is located in her lower back on both sides, with pain occasionally radiating down her right leg. It is alleviated by standing up straight or lying down, but is much worse with transitioning between lying to sitting, sitting to standing, or rolling over. Did have some toe numbness in her right foot this morning. Denies fever, bowel or bladder changes, or saddle anesthesia.  The ED gave her valium, percocet, and naproxen. These medications have helped her pain some but she is out of them.   Also she notes that for the last year or so, she has occasionally experienced "pressure" in her butt area that lasts from 30sec to 3 min. This pressure has occurred about monthly. It is alleviated by getting on her hands and knees and arching her back.  Also notes painful "knot" on right side in rib area for several months. Says another provider told her this was probably a muscle.  ROS: see HPI  PHYSICAL EXAM: Gen: NAD, appears in pain with movement Back: no gross deformities, tender to palpation on right side of lower back just lateral to spine, significant pain in lower back with transitioning between sitting and lying positions. Painful "knot" on side consistent with pulled muscle. Ext: positive straight leg test bilaterally, decreased sensation to fine touch in bilateral lower extremities, non pitting edema bilaterally present.

## 2012-05-31 NOTE — Patient Instructions (Signed)
It was nice to meet you today.  Take the prescribed medications as needed for your back pain. Return to clinic or emergency room if you have any fevers, are unable to control your urine or bowel movements, or have any numbness in your genital region.   I have referred you to physical therapy for evaluation and treatment.  Go to [facility] to have your xrays taken.  Follow up in 6 weeks with Dr. Armen Pickup, or in one week if pain not any better.

## 2012-05-31 NOTE — Assessment & Plan Note (Addendum)
Here for ED f/u. Pain is still present but is alleviated by meds. For pain control, will refill one week's supply of percocet and naproxen. Discontinue valium, add Flexeril.   She is tender to palpation over both SI joints. Will order xrays of lumbar spine and sacrum. I am also referring her to physical therapy for evaluation and treatment.  She is to make an appointment to follow up with her PCP Dr. Armen Pickup in six weeks. Have instructed her to return if pain is not better in one week, or if she gets a fever, cannot control her bowel movements or bladder, or has any numbness in her genital region.

## 2012-06-09 ENCOUNTER — Ambulatory Visit: Payer: Medicaid Other | Admitting: Family Medicine

## 2012-06-10 ENCOUNTER — Ambulatory Visit (INDEPENDENT_AMBULATORY_CARE_PROVIDER_SITE_OTHER): Payer: Medicaid Other | Admitting: Family Medicine

## 2012-06-10 ENCOUNTER — Encounter: Payer: Self-pay | Admitting: Family Medicine

## 2012-06-10 VITALS — BP 154/101 | HR 87 | Temp 98.1°F | Ht 70.0 in | Wt 305.0 lb

## 2012-06-10 DIAGNOSIS — M5430 Sciatica, unspecified side: Secondary | ICD-10-CM

## 2012-06-10 DIAGNOSIS — M2550 Pain in unspecified joint: Secondary | ICD-10-CM

## 2012-06-10 LAB — URIC ACID: Uric Acid, Serum: 3.6 mg/dL (ref 2.4–7.0)

## 2012-06-10 MED ORDER — PREDNISONE 50 MG PO TABS: 50.0000 mg | ORAL_TABLET | Freq: Every day | ORAL | Status: AC

## 2012-06-10 MED ORDER — OXYCODONE-ACETAMINOPHEN 5-325 MG PO TABS: 1.0000 | ORAL_TABLET | Freq: Three times a day (TID) | ORAL | Status: AC | PRN

## 2012-06-10 MED ORDER — OMEPRAZOLE 40 MG PO CPDR: 40.0000 mg | DELAYED_RELEASE_CAPSULE | Freq: Every day | ORAL | Status: DC

## 2012-06-10 MED ORDER — CYCLOBENZAPRINE HCL 10 MG PO TABS: 5.0000 mg | ORAL_TABLET | Freq: Every evening | ORAL | Status: AC | PRN

## 2012-06-10 MED ORDER — NAPROXEN 500 MG PO TABS: 500.0000 mg | ORAL_TABLET | Freq: Two times a day (BID) | ORAL | Status: DC

## 2012-06-10 NOTE — Patient Instructions (Addendum)
Melanie Phillips,  Thank you for coming in today. For your sciatica: 1. Percocet as needed only. Since this is a refill you will have to sign pain contract.  2. Flexeril as needed.  3. Prednisone x 10 days with omeprazole to prevent gastritis.  4. Get x-rays.  5. Call PT to set up therapy.  6. Weight loss is important to lessen strain on your back so be very careful with your diet.   I will call with blood work result if evidence of inflammatory arthritis I will refer you to PT.  Plan to see me in 1 month.   Dr. Armen Pickup

## 2012-06-10 NOTE — Progress Notes (Signed)
Subjective:     Patient ID: Melanie Phillips, female   DOB: 1966-04-25, 46 y.o.   MRN: [id number]  HPI 46 yo F presents for evaluation of low back pain with sciatica: 1. Low back pain with R sided sciatica: no better. She has not gone for x-rays or started PT. She admits to pain when sitting for a longtime. Pain better when standing or lying. She has numbness in R butt down to posterior knee. She denies falls, fever, fecal/urinary incontinence and groin numbness.   History of arthralgia esp in R knee and elevated RF with normal ESR in the past.   Review of Systems As per HPI    Objective:   Physical Exam BP 154/101  Pulse 87  Temp 98.1 F (36.7 C) (Oral)  Ht 5\' 10"  (1.778 m)  Wt 305 lb (138.347 kg)  BMI 43.76 kg/m2  LMP 05/02/2012 General appearance: alert, cooperative and mild distress Back Exam: Inspection: obese  Motion: full some preferential flexion when standing SLR seated: positive on R                        SLR lying:positive on R XSLR seated:    positive on R                    XSLR lying:positive on R Seated HS Flexibility: full on R, limited on L Palpable tenderness: L4 on R down to piriformin and SI joint.  FABER: negative  Sensory change: negative  Reflex change: 1+ bilaterally   Strength at foot Plantar-flexion: 5 / 5    Dorsi-flexion: 5 / 5    Eversion:  5/ 5   Inversion: 5 / 5 Leg strength Quad:  5/ 5   Hamstring:  5/ 5   Hip flexor:  5/ 5   Hip abductors:  5/ 5 Gait: antalgic        Assessment and Plan:

## 2012-06-10 NOTE — Assessment & Plan Note (Signed)
A: persistent low back pain with sciatica.  Plan: 1. Percocet as needed only. Since this is a refill you will have to sign pain contract.  2. Flexeril as needed.  3. Prednisone x 10 days with omeprazole to prevent gastritis.  4. Get x-rays.  5. Call PT to set up therapy.  6. Weight loss is important to lessen strain on your back so be very careful with your diet.

## 2012-06-10 NOTE — Assessment & Plan Note (Signed)
Recheck RF, ESR, CCP and uric acid. If meet serological criteria for RA will refer to rheum. No arthritis on exam.

## 2012-06-11 LAB — SEDIMENTATION RATE: Sed Rate: 11 mm/hr (ref 0–22)

## 2012-06-14 ENCOUNTER — Telehealth: Payer: Self-pay | Admitting: Family Medicine

## 2012-06-14 NOTE — Telephone Encounter (Signed)
Called patient left VM. RF remains elevated, this is a non-specific test. CCP is negative which means she does not have rheumatoid arthritis. ESR and uric acid area also negative.

## 2012-09-06 ENCOUNTER — Ambulatory Visit (INDEPENDENT_AMBULATORY_CARE_PROVIDER_SITE_OTHER): Payer: Medicaid Other | Admitting: Family Medicine

## 2012-09-06 ENCOUNTER — Encounter: Payer: Self-pay | Admitting: Family Medicine

## 2012-09-06 ENCOUNTER — Encounter (HOSPITAL_COMMUNITY): Payer: Self-pay | Admitting: Physical Medicine and Rehabilitation

## 2012-09-06 ENCOUNTER — Emergency Department (HOSPITAL_COMMUNITY)
Admission: EM | Admit: 2012-09-06 | Discharge: 2012-09-06 | Disposition: A | Discharge status: Home / Self Care | Payer: Medicaid Other | Attending: Emergency Medicine | Admitting: Emergency Medicine

## 2012-09-06 VITALS — BP 138/87 | HR 88 | Temp 98.2°F | Ht 70.0 in | Wt 299.0 lb

## 2012-09-06 DIAGNOSIS — Z23 Encounter for immunization: Secondary | ICD-10-CM

## 2012-09-06 DIAGNOSIS — I10 Essential (primary) hypertension: Secondary | ICD-10-CM | POA: Insufficient documentation

## 2012-09-06 DIAGNOSIS — M129 Arthropathy, unspecified: Secondary | ICD-10-CM | POA: Insufficient documentation

## 2012-09-06 DIAGNOSIS — M5430 Sciatica, unspecified side: Secondary | ICD-10-CM

## 2012-09-06 DIAGNOSIS — F17200 Nicotine dependence, unspecified, uncomplicated: Secondary | ICD-10-CM | POA: Insufficient documentation

## 2012-09-06 MED ORDER — OXYCODONE-ACETAMINOPHEN 5-325 MG PO TABS: 1.0000 | ORAL_TABLET | Freq: Two times a day (BID) | ORAL | Status: DC

## 2012-09-06 MED ORDER — CYCLOBENZAPRINE HCL 10 MG PO TABS: 10.0000 mg | ORAL_TABLET | Freq: Two times a day (BID) | ORAL | Status: DC | PRN

## 2012-09-06 MED ORDER — PREDNISONE 20 MG PO TABS: 60.0000 mg | ORAL_TABLET | Freq: Every day | ORAL | Status: DC

## 2012-09-06 MED ORDER — OXYCODONE-ACETAMINOPHEN 5-325 MG PO TABS
2.0000 | ORAL_TABLET | Freq: Once | ORAL | Status: AC
  Administered 2012-09-06: 2 via ORAL
  Filled 2012-09-06: qty 2

## 2012-09-06 NOTE — ED Provider Notes (Signed)
History     CSN: [id number]  Arrival date & time 09/06/12  0736   First MD Initiated Contact with Patient 09/06/12 [phone]      Chief Complaint  Patient presents with  . Pain    (Consider location/radiation/quality/duration/timing/severity/associated sxs/prior treatment) HPI  Pt to ER with complaints of exacerbation of her sciatica. She see's Dr. Armen Pickup for this and has a pain contract. She says that this exacerbation has been persisting for 4 days and that she is out of her medication. She denies bowel or urine incontinence. She denies numbness in the [place] area. Her pain is 10/10. NAD/ VSS  Past Medical History  Diagnosis Date  . Hypertension   . Arthritis   . Back pain     No past surgical history on file.  No family history on file.  History  Substance Use Topics  . Smoking status: Current Every Day Smoker -- 0.5 packs/day    Types: Cigarettes  . Smokeless tobacco: Not on file  . Alcohol Use: No    OB History    Grav Para Term Preterm Abortions TAB SAB Ect Mult Living                  Review of Systems   Review of Systems  Gen: no weight loss, fevers, chills, night sweats  Eyes: no discharge or drainage, no occular pain or visual changes  Nose: no epistaxis or rhinorrhea  Mouth: no dental pain, no sore throat  Neck: no neck pain  Lungs:No wheezing, coughing or hemoptysis CV: no chest pain, palpitations, dependent edema or orthopnea  Abd: no abdominal pain, nausea, vomiting  GU: no dysuria or gross hematuria  MSK:  Right hip pain  Neuro: no headache, no focal neurologic deficits  Skin: no abnormalities Psyche: negative.    Allergies  Review of patient's allergies indicates no known allergies.  Home Medications   Current Outpatient Rx  Name Route Sig Dispense Refill  . CYCLOBENZAPRINE HCL 10 MG PO TABS Oral Take 1 tablet (10 mg total) by mouth 2 (two) times daily as needed for muscle spasms. 20 tablet 0  . PREDNISONE 20 MG PO TABS Oral Take 3  tablets (60 mg total) by mouth daily. 15 tablet 0    BP 158/113  Pulse 100  Temp 98.5 F (36.9 C) (Oral)  Resp 20  SpO2 99%  Physical Exam  Nursing note and vitals reviewed. Constitutional: She appears well-developed and well-nourished. No distress.  HENT:  Head: Normocephalic and atraumatic.  Eyes: Pupils are equal, round, and reactive to light.  Neck: Normal range of motion. Neck supple.  Cardiovascular: Normal rate and regular rhythm.   Pulmonary/Chest: Effort normal.  Abdominal: Soft.  Musculoskeletal:       Legs:       Equal strength to bilateral lower extremities. Neurosensory  function adequate to both legs. Skin color is normal. Skin is warm and moist. I see no step off deformity, no bony tenderness. Pt is able to ambulate without limp. Pain is relieved when sitting in certain positions. ROM is decreased due to pain. No crepitus, laceration, effusion, swelling.  Pulses are normal   Neurological: She is alert.  Skin: Skin is warm and dry.    ED Course  Procedures (including critical care time)  Labs Reviewed - No data to display No results found.   1. Sciatica       MDM  Patient with back pain. No neurological deficits. Patient is ambulatory. No warning symptoms of  back pain including: loss of bowel or bladder control, night sweats, waking from sleep with back pain, unexplained fevers or weight loss, h/o cancer, IVDU, recent trauma. No concern for cauda equina, epidural abscess, or other serious cause of back pain. Conservative measures such as rest, ice/heat with PCP follow-up if no improvement with conservative management.           Dorthula Matas, PA 09/06/12 [phone]

## 2012-09-06 NOTE — Assessment & Plan Note (Signed)
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  Prescribed 20 percocet. No refill.  If pain persist x 3-4 weeks, refer to PT.  Exercise per AVS.

## 2012-09-06 NOTE — Patient Instructions (Addendum)
Melanie Phillips,  Thank you for coming in today.  Please take the prednisone and flexeril as prescribed.  Also take percocet for severe pain. Do the exercises below. If pain persist x 4 weeks, I will send you to PT.  If you develop weakness, incontinence (loss of urine or stool) or groin numbness please call and come in.  Dr. Armen Pickup   Sciatica with Rehab The sciatic nerve runs from the back down the leg and is responsible for sensation and control of the muscles in the back (posterior) side of the thigh, lower leg, and foot. Sciatica is a condition that is characterized by inflammation of this nerve.  SYMPTOMS   Signs of nerve damage, including numbness and/or weakness along the posterior side of the lower extremity.  Pain in the back of the thigh that may also travel down the leg.  Pain that worsens when sitting for long periods of time.  Occasionally, pain in the back or buttock. CAUSES  Inflammation of the sciatic nerve is the cause of sciatica. The inflammation is due to something irritating the nerve. Common sources of irritation include:  Sitting for long periods of time.  Direct trauma to the nerve.  Arthritis of the spine.  Herniated or ruptured disk.  Slipping of the vertebrae (spondylolithesis)  Pressure from soft tissues, such as muscles or ligament-like tissue (fascia). RISK INCREASES WITH:  Sports that place pressure or stress on the spine (football or weightlifting).  Poor strength and flexibility.  Failure to warm-up properly before activity.  Family history of low back pain or disk disorders.  Previous back injury or surgery.  Poor body mechanics, especially when lifting, or poor posture. PREVENTION   Warm up and stretch properly before activity.  Maintain physical fitness:  Strength, flexibility, and endurance.  Cardiovascular fitness.  Learn and use proper technique, especially with posture and lifting. When possible, have coach correct improper  technique.  Avoid activities that place stress on the spine. PROGNOSIS If treated properly, then sciatica usually resolves within 6 weeks. However, occasionally surgery is necessary.  RELATED COMPLICATIONS   Permanent nerve damage, including pain, numbness, tingle, or weakness.  Chronic back pain.  Risks of surgery: infection, bleeding, nerve damage, or damage to surrounding tissues. TREATMENT Treatment initially involves resting from any activities that aggravate your symptoms. The use of ice and medication may help reduce pain and inflammation. The use of strengthening and stretching exercises may help reduce pain with activity. These exercises may be performed at home or with referral to a therapist. A therapist may recommend further treatments, such as transcutaneous electronic nerve stimulation (TENS) or ultrasound. Your caregiver may recommend corticosteroid injections to help reduce inflammation of the sciatic nerve. If symptoms persist despite non-surgical (conservative) treatment, then surgery may be recommended. MEDICATION  If pain medication is necessary, then nonsteroidal anti-inflammatory medications, such as aspirin and ibuprofen, or other minor pain relievers, such as acetaminophen, are often recommended.  Do not take pain medication for 7 days before surgery.  Prescription pain relievers may be given if deemed necessary by your caregiver. Use only as directed and only as much as you need.  Ointments applied to the skin may be helpful.  Corticosteroid injections may be given by your caregiver. These injections should be reserved for the most serious cases, because they may only be given a certain number of times. HEAT AND COLD  Cold treatment (icing) relieves pain and reduces inflammation. Cold treatment should be applied for 10 to 15 minutes every 2 to  3 hours for inflammation and pain and immediately after any activity that aggravates your symptoms. Use ice packs or  massage the area with a piece of ice (ice massage).  Heat treatment may be used prior to performing the stretching and strengthening activities prescribed by your caregiver, physical therapist, or athletic trainer. Use a heat pack or soak the injury in warm water. SEEK MEDICAL CARE IF:  Treatment seems to offer no benefit, or the condition worsens.  Any medications produce adverse side effects. EXERCISES  RANGE OF MOTION (ROM) AND STRETCHING EXERCISES - Sciatica Most people with sciatic will find that their symptoms worsen with either excessive bending forward (flexion) or arching at the low back (extension). The exercises which will help resolve your symptoms will focus on the opposite motion. Your physician, physical therapist or athletic trainer will help you determine which exercises will be most helpful to resolve your low back pain. Do not complete any exercises without first consulting with your clinician. Discontinue any exercises which worsen your symptoms until you speak to your clinician. If you have pain, numbness or tingling which travels down into your buttocks, leg or foot, the goal of the therapy is for these symptoms to move closer to your back and eventually resolve. Occasionally, these leg symptoms will get better, but your low back pain may worsen; this is typically an indication of progress in your rehabilitation. Be certain to be very alert to any changes in your symptoms and the activities in which you participated in the 24 hours prior to the change. Sharing this information with your clinician will allow him/her to most efficiently treat your condition. These exercises may help you when beginning to rehabilitate your injury. Your symptoms may resolve with or without further involvement from your physician, physical therapist or athletic trainer. While completing these exercises, remember:   Restoring tissue flexibility helps normal motion to return to the joints. This allows  healthier, less painful movement and activity.  An effective stretch should be held for at least 30 seconds.  A stretch should never be painful. You should only feel a gentle lengthening or release in the stretched tissue. FLEXION RANGE OF MOTION AND STRETCHING EXERCISES: STRETCH  Flexion, Single Knee to Chest   Lie on a firm bed or floor with both legs extended in front of you.  Keeping one leg in contact with the floor, bring your opposite knee to your chest. Hold your leg in place by either grabbing behind your thigh or at your knee.  Pull until you feel a gentle stretch in your low back. Hold __________ seconds.  Slowly release your grasp and repeat the exercise with the opposite side. Repeat __________ times. Complete this exercise __________ times per day.  STRETCH  Flexion, Double Knee to Chest  Lie on a firm bed or floor with both legs extended in front of you.  Keeping one leg in contact with the floor, bring your opposite knee to your chest.  Tense your stomach muscles to support your back and then lift your other knee to your chest. Hold your legs in place by either grabbing behind your thighs or at your knees.  Pull both knees toward your chest until you feel a gentle stretch in your low back. Hold __________ seconds.  Tense your stomach muscles and slowly return one leg at a time to the floor. Repeat __________ times. Complete this exercise __________ times per day.  STRETCH  Low Trunk Rotation   Lie on a firm bed  or floor. Keeping your legs in front of you, bend your knees so they are both pointed toward the ceiling and your feet are flat on the floor.  Extend your arms out to the side. This will stabilize your upper body by keeping your shoulders in contact with the floor.  Gently and slowly drop both knees together to one side until you feel a gentle stretch in your low back. Hold for __________ seconds.  Tense your stomach muscles to support your low back as you  bring your knees back to the starting position. Repeat the exercise to the other side. Repeat __________ times. Complete this exercise __________ times per day  EXTENSION RANGE OF MOTION AND FLEXIBILITY EXERCISES: STRETCH  Extension, Prone on Elbows  Lie on your stomach on the floor, a bed will be too soft. Place your palms about shoulder width apart and at the height of your head.  Place your elbows under your shoulders. If this is too painful, stack pillows under your chest.  Allow your body to relax so that your hips drop lower and make contact more completely with the floor.  Hold this position for __________ seconds.  Slowly return to lying flat on the floor. Repeat __________ times. Complete this exercise __________ times per day.  RANGE OF MOTION  Extension, Prone Press Ups  Lie on your stomach on the floor, a bed will be too soft. Place your palms about shoulder width apart and at the height of your head.  Keeping your back as relaxed as possible, slowly straighten your elbows while keeping your hips on the floor. You may adjust the placement of your hands to maximize your comfort. As you gain motion, your hands will come more underneath your shoulders.  Hold this position __________ seconds.  Slowly return to lying flat on the floor. Repeat __________ times. Complete this exercise __________ times per day.  STRENGTHENING EXERCISES - Sciatica  These exercises may help you when beginning to rehabilitate your injury. These exercises should be done near your "sweet spot." This is the neutral, low-back arch, somewhere between fully rounded and fully arched, that is your least painful position. When performed in this safe range of motion, these exercises can be used for people who have either a flexion or extension based injury. These exercises may resolve your symptoms with or without further involvement from your physician, physical therapist or athletic trainer. While completing these  exercises, remember:   Muscles can gain both the endurance and the strength needed for everyday activities through controlled exercises.  Complete these exercises as instructed by your physician, physical therapist or athletic trainer. Progress with the resistance and repetition exercises only as your caregiver advises.  You may experience muscle soreness or fatigue, but the pain or discomfort you are trying to eliminate should never worsen during these exercises. If this pain does worsen, stop and make certain you are following the directions exactly. If the pain is still present after adjustments, discontinue the exercise until you can discuss the trouble with your clinician. STRENGTHENING Deep Abdominals, Pelvic Tilt   Lie on a firm bed or floor. Keeping your legs in front of you, bend your knees so they are both pointed toward the ceiling and your feet are flat on the floor.  Tense your lower abdominal muscles to press your low back into the floor. This motion will rotate your pelvis so that your tail bone is scooping upwards rather than pointing at your feet or into the floor.  With a gentle tension and even breathing, hold this position for __________ seconds. Repeat __________ times. Complete this exercise __________ times per day.  STRENGTHENING  Abdominals, Crunches   Lie on a firm bed or floor. Keeping your legs in front of you, bend your knees so they are both pointed toward the ceiling and your feet are flat on the floor. Cross your arms over your chest.  Slightly tip your chin down without bending your neck.  Tense your abdominals and slowly lift your trunk high enough to just clear your shoulder blades. Lifting higher can put excessive stress on the low back and does not further strengthen your abdominal muscles.  Control your return to the starting position. Repeat __________ times. Complete this exercise __________ times per day.  STRENGTHENING  Quadruped, Opposite UE/LE  Lift  Assume a hands and knees position on a firm surface. Keep your hands under your shoulders and your knees under your hips. You may place padding under your knees for comfort.  Find your neutral spine and gently tense your abdominal muscles so that you can maintain this position. Your shoulders and hips should form a rectangle that is parallel with the floor and is not twisted.  Keeping your trunk steady, lift your right hand no higher than your shoulder and then your left leg no higher than your hip. Make sure you are not holding your breath. Hold this position __________ seconds.  Continuing to keep your abdominal muscles tense and your back steady, slowly return to your starting position. Repeat with the opposite arm and leg. Repeat __________ times. Complete this exercise __________ times per day.  STRENGTHENING  Abdominals and Quadriceps, Straight Leg Raise   Lie on a firm bed or floor with both legs extended in front of you.  Keeping one leg in contact with the floor, bend the other knee so that your foot can rest flat on the floor.  Find your neutral spine, and tense your abdominal muscles to maintain your spinal position throughout the exercise.  Slowly lift your straight leg off the floor about 6 inches for a count of 15, making sure to not hold your breath.  Still keeping your neutral spine, slowly lower your leg all the way to the floor. Repeat this exercise with each leg __________ times. Complete this exercise __________ times per day. POSTURE AND BODY MECHANICS CONSIDERATIONS - Sciatica Keeping correct posture when sitting, standing or completing your activities will reduce the stress put on different body tissues, allowing injured tissues a chance to heal and limiting painful experiences. The following are general guidelines for improved posture. Your physician or physical therapist will provide you with any instructions specific to your needs. While reading these  guidelines, remember:  The exercises prescribed by your provider will help you have the flexibility and strength to maintain correct postures.  The correct posture provides the optimal environment for your joints to work. All of your joints have less wear and tear when properly supported by a spine with good posture. This means you will experience a healthier, less painful body.  Correct posture must be practiced with all of your activities, especially prolonged sitting and standing. Correct posture is as important when doing repetitive low-stress activities (typing) as it is when doing a single heavy-load activity (lifting). RESTING POSITIONS Consider which positions are most painful for you when choosing a resting position. If you have pain with flexion-based activities (sitting, bending, stooping, squatting), choose a position that allows you to rest in  a less flexed posture. You would want to avoid curling into a fetal position on your side. If your pain worsens with extension-based activities (prolonged standing, working overhead), avoid resting in an extended position such as sleeping on your stomach. Most people will find more comfort when they rest with their spine in a more neutral position, neither too rounded nor too arched. Lying on a non-sagging bed on your side with a pillow between your knees, or on your back with a pillow under your knees will often provide some relief. Keep in mind, being in any one position for a prolonged period of time, no matter how correct your posture, can still lead to stiffness. PROPER SITTING POSTURE In order to minimize stress and discomfort on your spine, you must sit with correct posture Sitting with good posture should be effortless for a healthy body. Returning to good posture is a gradual process. Many people can work toward this most comfortably by using various supports until they have the flexibility and strength to maintain this posture on their  own. When sitting with proper posture, your ears will fall over your shoulders and your shoulders will fall over your hips. You should use the back of the chair to support your upper back. Your low back will be in a neutral position, just slightly arched. You may place a small pillow or folded towel at the base of your low back for support.  When working at a desk, create an environment that supports good, upright posture. Without extra support, muscles fatigue and lead to excessive strain on joints and other tissues. Keep these recommendations in mind: CHAIR:   A chair should be able to slide under your desk when your back makes contact with the back of the chair. This allows you to work closely.  The chair's height should allow your eyes to be level with the upper part of your monitor and your hands to be slightly lower than your elbows. BODY POSITION  Your feet should make contact with the floor. If this is not possible, use a foot rest.  Keep your ears over your shoulders. This will reduce stress on your neck and low back. INCORRECT SITTING POSTURES   If you are feeling tired and unable to assume a healthy sitting posture, do not slouch or slump. This puts excessive strain on your back tissues, causing more damage and pain. Healthier options include:  Using more support, like a lumbar pillow.  Switching tasks to something that requires you to be upright or walking.  Talking a brief walk.  Lying down to rest in a neutral-spine position. PROLONGED STANDING WHILE SLIGHTLY LEANING FORWARD  When completing a task that requires you to lean forward while standing in one place for a long time, place either foot up on a stationary 2-4 inch high object to help maintain the best posture. When both feet are on the ground, the low back tends to lose its slight inward curve. If this curve flattens (or becomes too large), then the back and your other joints will experience too much stress, fatigue more  quickly and can cause pain.  CORRECT STANDING POSTURES Proper standing posture should be assumed with all daily activities, even if they only take a few moments, like when brushing your teeth. As in sitting, your ears should fall over your shoulders and your shoulders should fall over your hips. You should keep a slight tension in your abdominal muscles to brace your spine. Your tailbone should point down  to the ground, not behind your body, resulting in an over-extended swayback posture.  INCORRECT STANDING POSTURES  Common incorrect standing postures include a forward head, locked knees and/or an excessive swayback. WALKING Walk with an upright posture. Your ears, shoulders and hips should all line-up. PROLONGED ACTIVITY IN A FLEXED POSITION When completing a task that requires you to bend forward at your waist or lean over a low surface, try to find a way to stabilize 3 of 4 of your limbs. You can place a hand or elbow on your thigh or rest a knee on the surface you are reaching across. This will provide you more stability so that your muscles do not fatigue as quickly. By keeping your knees relaxed, or slightly bent, you will also reduce stress across your low back. CORRECT LIFTING TECHNIQUES DO :   Assume a wide stance. This will provide you more stability and the opportunity to get as close as possible to the object which you are lifting.  Tense your abdominals to brace your spine; then bend at the knees and hips. Keeping your back locked in a neutral-spine position, lift using your leg muscles. Lift with your legs, keeping your back straight.  Test the weight of unknown objects before attempting to lift them.  Try to keep your elbows locked down at your sides in order get the best strength from your shoulders when carrying an object.  Always ask for help when lifting heavy or awkward objects. INCORRECT LIFTING TECHNIQUES DO NOT:   Lock your knees when lifting, even if it is a small  object.  Bend and twist. Pivot at your feet or move your feet when needing to change directions.  Assume that you cannot safely pick up a paperclip without proper posture. Document Released: 11/16/2005 Document Revised: 02/08/2012 Document Reviewed: 02/28/2009 [facility] Patient Information 2013 [place], [place].

## 2012-09-06 NOTE — ED Notes (Signed)
Patient verbalized understanding of discharge instructions.  She is aware that she cannot drive for at least 4 hours

## 2012-09-06 NOTE — Progress Notes (Signed)
Patient ID: Melanie Phillips, female   DOB: 06-25-1966, 46 y.o.   MRN: [id number] Subjective:    Melanie Phillips is a 46 y.o. female who presents for evaluation of low back pain. The patient has had recurrent self limited episodes of low back pain in the past. Symptoms have been present for 4 days and are gradually worsening.  Onset was related to / precipitated by no known injury. The pain is located in the right lumbar area, right gluteal area  radiates to the right thigh. The pain is described as aching and pressure and occurs all day. She rates her pain as a 9 on a scale of 0-10. Symptoms are exacerbated by straining. Symptoms are improved by narcotic pain medications that she received in the ED this AM (see note).  She has no other symptoms associated with the back pain. She denies weakness, fever, incontinence and groin numbness. The patient has no "red flag" history indicative of complicated back pain.  Review of Systems Pertinent items are noted in HPI.    Objective:   Inspection and palpation: inspection of back is normal, paraspinal tenderness noted R side and R gluteal. . Muscle tone and ROM exam: muscle tone normal without spasm. Straight leg raise: positive at 30 degrees on the right. Neurological: normal DTRs, muscle strength and reflexes.    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  Prescribed 20 percocet. No refill.  If pain persist x 3-4 weeks, refer to PT.  Exercise per AVS.

## 2012-09-06 NOTE — ED Notes (Signed)
Pt presents to department for evaluation of pain. States chronic issues with sciatica and arthritis. Pain to R shoulder, elbow, lower back and leg. Ongoing for several days. No relief with medications at home. 10/10 pain at the time. She is alert and oriented x4. Ambulatory to triage. No signs of distress noted.

## 2012-09-07 NOTE — ED Provider Notes (Signed)
Medical screening examination/treatment/procedure(s) were performed by non-physician practitioner and as supervising physician I was immediately available for consultation/collaboration.   Lyanne Co, MD 09/07/12 [id number]

## 2013-04-17 ENCOUNTER — Encounter: Payer: Self-pay | Admitting: Family Medicine

## 2013-04-17 ENCOUNTER — Ambulatory Visit (INDEPENDENT_AMBULATORY_CARE_PROVIDER_SITE_OTHER): Payer: Medicaid Other | Admitting: Family Medicine

## 2013-04-17 VITALS — BP 148/91 | HR 90 | Temp 97.8°F | Ht 70.0 in | Wt 303.0 lb

## 2013-04-17 DIAGNOSIS — E669 Obesity, unspecified: Secondary | ICD-10-CM

## 2013-04-17 DIAGNOSIS — M5430 Sciatica, unspecified side: Secondary | ICD-10-CM

## 2013-04-17 MED ORDER — NAPROXEN 500 MG PO TABS: 500.0000 mg | ORAL_TABLET | Freq: Two times a day (BID) | ORAL | Status: DC

## 2013-04-17 MED ORDER — TRAMADOL HCL 50 MG PO TABS: 50.0000 mg | ORAL_TABLET | Freq: Three times a day (TID) | ORAL | Status: DC | PRN

## 2013-04-17 MED ORDER — GABAPENTIN 300 MG PO CAPS: 300.0000 mg | ORAL_CAPSULE | Freq: Every day | ORAL | Status: DC

## 2013-04-17 MED ORDER — CYCLOBENZAPRINE HCL 10 MG PO TABS: 10.0000 mg | ORAL_TABLET | Freq: Two times a day (BID) | ORAL | Status: DC | PRN

## 2013-04-17 NOTE — Progress Notes (Signed)
  Subjective:    Patient ID: Melanie Phillips, female    DOB: 01-22-1966, 47 y.o.   MRN: [id number]  HPI  Melanie Phillips comes in for recurrent back pain with sciatica.  This started bothering her again about 3 weeks ago.  She has pain in her lumbar back, with radiation to bilateral legs.  Denies incontinence, numbness, weakness of legs. She does state that pain make her stiff and limits her mobility.  She has never been to PT for this problem before.    Review of Systems Pertinent items in HPI    Objective:   Physical Exam BP 148/91  Pulse 90  Temp(Src) 97.8 F (36.6 C) (Oral)  Ht 5\' 10"  (1.778 m)  Wt 303 lb (137.44 kg)  BMI 43.48 kg/m2  LMP 03/21/2013 General appearance: alert, cooperative, no distress and morbidly obese Back: Normal Curvature, no deformities or CVA tenderness Paraspinal Tenderness: lumbar area bilaterally LE Strength 5/5 LE Sensation: grossly in tact LE Reflexes 2+ and symmetric      Assessment & Plan:

## 2013-04-17 NOTE — Patient Instructions (Signed)
It was nice to see you, I am sorry your back is hurting.  I am referring you to physical therapy for your back pain to help get your back stronger.  We will call you with your appointment.   Please start taking the gabapentin at night time to help your nerve pain, I have also prescribed tramadol and flexeril to help.  Please continue to use ice or heat on your back.

## 2013-04-17 NOTE — Assessment & Plan Note (Signed)
Complicating back pain, discussed importance of weight loss for back health.

## 2013-04-17 NOTE — Assessment & Plan Note (Signed)
Rx for gabapentin qhs, naproxin BID, tramadol and flexeril PRN.  Will refer to PT for treatment.

## 2013-04-26 ENCOUNTER — Ambulatory Visit: Payer: Medicaid Other | Admitting: Physical Therapy

## 2013-05-01 ENCOUNTER — Ambulatory Visit: Payer: Medicaid Other | Attending: Family Medicine | Admitting: Physical Therapy

## 2013-05-01 DIAGNOSIS — IMO0001 Reserved for inherently not codable concepts without codable children: Secondary | ICD-10-CM | POA: Insufficient documentation

## 2013-05-01 DIAGNOSIS — M545 Low back pain, unspecified: Secondary | ICD-10-CM | POA: Insufficient documentation

## 2013-05-09 ENCOUNTER — Ambulatory Visit: Payer: Medicaid Other | Admitting: Rehabilitation

## 2013-05-16 ENCOUNTER — Encounter: Payer: Medicaid Other | Admitting: Rehabilitation

## 2013-05-23 ENCOUNTER — Encounter: Payer: Medicaid Other | Admitting: Rehabilitation

## 2013-06-14 ENCOUNTER — Encounter: Payer: Self-pay | Admitting: Family Medicine

## 2013-06-14 ENCOUNTER — Ambulatory Visit (INDEPENDENT_AMBULATORY_CARE_PROVIDER_SITE_OTHER): Payer: Medicaid Other | Admitting: Family Medicine

## 2013-06-14 VITALS — BP 141/91 | HR 81 | Temp 97.9°F | Ht 70.0 in | Wt 305.0 lb

## 2013-06-14 DIAGNOSIS — M2550 Pain in unspecified joint: Secondary | ICD-10-CM

## 2013-06-14 MED ORDER — CYCLOBENZAPRINE HCL 10 MG PO TABS: 10.0000 mg | ORAL_TABLET | Freq: Two times a day (BID) | ORAL | Status: DC | PRN

## 2013-06-14 MED ORDER — PREGABALIN 50 MG PO CAPS
50.0000 mg | ORAL_CAPSULE | Freq: Two times a day (BID) | ORAL | Status: DC
Start: 2013-06-14 — End: 2013-09-28

## 2013-06-14 MED ORDER — MELOXICAM 15 MG PO TABS: 15.0000 mg | ORAL_TABLET | Freq: Every day | ORAL | Status: DC

## 2013-06-14 NOTE — Patient Instructions (Addendum)
It was good to see you today.  I think your thumb is arthritis. Continue to use heat and muscle rubs. Try the Mobic daily. For your shoulder, I am going to give you some range of motion exercise. Hopefully the Mobic will help with that. I think your side is a muscle, but I would recommend you get a mammogram.  Amber M. Hairford, M.D.   Shoulder Range of Motion Exercises The shoulder is the most flexible joint in the human body. Because of this it is also the most unstable joint in the body. All ages can develop shoulder problems. Early treatment of problems is necessary for a good outcome. People react to shoulder pain by decreasing the movement of the joint. After a brief period of time, the shoulder can become "frozen". This is an almost complete loss of the ability to move the damaged shoulder. Following injuries your caregivers can give you instructions on exercises to keep your range of motion (ability to move your shoulder freely), or regain it if it has been lost.  EXERCISES EXERCISES TO MAINTAIN THE MOBILITY OF YOUR SHOULDER: Codman's Exercise or Pendulum Exercise  This exercise may be performed in a prone (face-down) lying position or standing while leaning on a chair with the opposite arm. Its purpose is to relax the muscles in your shoulder and slowly but surely increase the range of motion and to relieve pain.  Lie on your stomach close to the side edge of the bed. Let your weak arm hang over the edge of the bed. Relax your shoulder, arm and hand. Let your shoulder blade relax and drop down.  Slowly and gently swing your arm forward and back. Do not use your neck muscles; relax them. It might be easier to have someone else gently start swinging your arm.  As pain decreases, increase your swing. To start, arm swing should begin at 15 degree angles. In time and as pain lessens, move to 30-45 degree angles. Start with swinging for about 15 seconds, and work towards swinging for 3 to 5  minutes.  This exercise may also be performed in a standing/bent over position.  Stand and hold onto a sturdy chair with your good arm. Bend forward at the waist and bend your knees slightly to help protect your back. Relax your weak arm, let it hang limp. Relax your shoulder blade and let it drop.  Keep your shoulder relaxed and use body motion to swing your arm in small circles.  Stand up tall and relax.  Repeat motion and change direction of circles.  Start with swinging for about 30 seconds, and work towards swinging for 3 to 5 minutes. STRETCHING EXERCISES:  Lift your arm out in front of you with the elbow bent at 90 degrees. Using your other arm gently pull the elbow forward and across your body.  Bend one arm behind you with the palm facing outward. Using the other arm, hold a towel or rope and reach this arm up above your head, then bend it at the elbow to move your wrist to behind your neck. Grab the free end of the towel with the hand behind your back. Gently pull the towel up with the hand behind your neck, gradually increasing the pull on the hand behind the small of your back. Then, gradually pull down with the hand behind the small of your back. This will pull the hand and arm behind your neck further. Both shoulders will have an increased range of motion  with repetition of this exercise. STRENGTHENING EXERCISES:  Standing with your arm at your side and straight out from your shoulder with the elbow bent at 90 degrees, hold onto a small weight and slowly raise your hand so it points straight up in the air. Repeat this five times to begin with, and gradually increase to ten times. Do this four times per day. As you grow stronger you can gradually increase the weight.  Repeat the above exercise, only this time using an elastic band. Start with your hand up in the air and pull down until your hand is by your side. As you grow stronger, gradually increase the amount you pull by  increasing the number or size of the elastic bands. Use the same amount of repetitions.  Standing with your hand at your side and holding onto a weight, gradually lift the hand in front of you until it is over your head. Do the same also with the hand remaining at your side and lift the hand away from your body until it is again over your head. Repeat this five times to begin with, and gradually increase to ten times. Do this four times per day. As you grow stronger you can gradually increase the weight. Document Released: 08/15/2003 Document Revised: 02/08/2012 Document Reviewed: 11/16/2005 [facility] Patient Information 2014 [place], [place].

## 2013-06-14 NOTE — Progress Notes (Signed)
Patient ID: Melanie Phillips, female   DOB: [birth year], 47 y.o.   MRN: [id number]  [facility] Dwan Fennel M. Emanii Bugbee, MD Phone: [phone]   Subjective: HPI: Patient is a 47 y.o. female presenting to clinic today for same day appointment for right side pain. Concerns today include side pain, wrist pain, shoulder pain  1. Side Pain: Started on right side (midaxiallary line) last year. Had OMT which helped. Started flaring 3 weeks ago, she states she feels a knot under the skin. She had tried Tramadol and Flexeril, which helps some but does not take it away. No rashes on skin. No injury to that area.   2. Wrist pain: Right wrist and thumb pain. States the skin is very tender. This morning her thumb was stiff and took a while to relax. 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 Pain is better with heat and ibuprofen. Some weakness of the wrist and difficulty holding heavy things.   3. Shoulder: Right shoulder pain, started 2 months ago. Thought she had slept on it wrong way but pain continued. Pain is worse with movement. Feels better with the arm up.    History Reviewed: Everyday smoker  ROS: Please see HPI above.  Objective: Office vital signs reviewed. BP 141/91  Pulse 81  Temp(Src) 97.9 F (36.6 C) (Oral)  Ht 5\' 10"  (1.778 m)  Wt 305 lb (138.347 kg)  BMI 43.76 kg/m2  Physical Examination:  General: Awake, alert. NAD HEENT: Atraumatic, normocephalic Extremities: Right thumb with FROM and no edema/erythema. TTP over joint, No sign of carpal tunnel or deQuervain. Right shoulder also with good ROM. No joint tenderness or deformity. Slightly positive hawkins and empty can test. Otherwise, wnl on my exam. TTP midaxiallary line around T4-T5. No rashes or redness. No lumps or knot appreciated.  Neuro: Grossly intact  Assessment: 47 y.o. female same day for pain  Plan: See Problem List and After Visit Summary

## 2013-06-15 NOTE — Assessment & Plan Note (Addendum)
Wrist/thumb pain most likely osteoarthritis. Will prescribe Mobic daily and use heat or muscle rubs. Shoulder pain could either be tendonitis or arthritis. Try ROM exercises and Mobic. RTC if fails to improve and consider referral to [facility] for ultrasound. Side pain could be muscle irrigation, but I am not sure. No red flags. Continue to monitor, and encouraged to get mammogram. For pain, patient was not tolerating gabapentin. Will try on Lyrica very low dose and work up.

## 2013-07-13 ENCOUNTER — Ambulatory Visit: Payer: Medicaid Other | Admitting: Family Medicine

## 2013-07-19 ENCOUNTER — Encounter: Payer: Self-pay | Admitting: Family Medicine

## 2013-07-19 ENCOUNTER — Ambulatory Visit (INDEPENDENT_AMBULATORY_CARE_PROVIDER_SITE_OTHER): Payer: Medicaid Other | Admitting: Family Medicine

## 2013-07-19 VITALS — BP 139/85 | HR 97 | Temp 97.9°F | Ht 70.0 in | Wt 304.3 lb

## 2013-07-19 DIAGNOSIS — M2550 Pain in unspecified joint: Secondary | ICD-10-CM

## 2013-07-19 MED ORDER — MELOXICAM 15 MG PO TABS: 15.0000 mg | ORAL_TABLET | Freq: Every day | ORAL | Status: DC

## 2013-07-19 NOTE — Assessment & Plan Note (Signed)
Continue Mobic and Lyrica (encouraged to pick up when gabapentin runs out.) Will refer to sports medicine for evaluation, but patient reminded that she will only be able to talk about one joint at a time.

## 2013-07-19 NOTE — Addendum Note (Signed)
Addended by: Hilarie Fredrickson on: 07/19/2013 04:00 PM   Modules accepted: Orders

## 2013-07-19 NOTE — Progress Notes (Signed)
Patient ID: Melanie Phillips, female   DOB: [birth year], 47 y.o.   MRN: [id number]  [facility] Amber M. Hairford, MD Phone: [phone]   Subjective: HPI: Patient is a 47 y.o. female presenting to clinic today for follow up appointment for multiple joint pain. Concerns today include left elbow pain  Did not start Mobic or Lyrica yet.  Right shoulder- a little better. Doing exercises, using muscle rubs. More pain after the  Right wrist- Still hurting and wrapping around Rib pain- Still hurting, worse with reaching around.  Left elbow- Had hurt in the past, but now it is hurting more. No injury. Difficult to pick up things due to the pain. Reports some swelling of the area. Able to straighten arm but causes pain.    History Reviewed: Everyday smoker.  ROS: Please see HPI above.  Objective: Office vital signs reviewed. BP 139/85  Pulse 97  Temp(Src) 97.9 F (36.6 C) (Oral)  Ht 5\' 10"  (1.778 m)  Wt 304 lb 5 oz (138.035 kg)  BMI 43.66 kg/m2  LMP 06/23/2013  Physical Examination:  General: Awake, alert. NAD. Very plesant HEENT: Atraumatic, normocephalic. MMM Pulm: CTAB, no wheezes Cardio: RRR, no murmurs appreciated  Msk: No edema of extremities Left elbow- No obvious swelling or deformity. TTP of medial epicondyle. Pain with downward motion of outstretched arm Right shoulder- Difficult to reach up. Pain with hawkins, pain with empty can. TTP of anterior aspect of shoulder Right wrist- FROM. Pain with palpation of palmar side of wrist. Pain of thumb with making fist Right ribs- TTP in midaxillary line.   Neuro: Grossly intact  Assessment: 47 y.o. female with multiple joint pains  Plan: See Problem List and After Visit Summary

## 2013-07-19 NOTE — Patient Instructions (Addendum)
It was good to see you today!  Lets keep using the Mobic and Lyrica.  I will see if we can get you in at sports medicine for an ultrasound, but it will be looking at only one joint at a time.  Leanndra Pember M. Shauntay Brunelli, M.D.

## 2013-08-01 ENCOUNTER — Ambulatory Visit: Payer: Medicaid Other | Admitting: Family Medicine

## 2013-08-22 ENCOUNTER — Encounter: Payer: Self-pay | Admitting: Family Medicine

## 2013-08-22 ENCOUNTER — Ambulatory Visit (INDEPENDENT_AMBULATORY_CARE_PROVIDER_SITE_OTHER): Payer: Medicaid Other | Admitting: Family Medicine

## 2013-08-22 VITALS — BP 163/93 | Ht 69.0 in | Wt 300.0 lb

## 2013-08-22 DIAGNOSIS — M25569 Pain in unspecified knee: Secondary | ICD-10-CM

## 2013-08-22 DIAGNOSIS — M545 Low back pain, unspecified: Secondary | ICD-10-CM

## 2013-08-22 DIAGNOSIS — M25531 Pain in right wrist: Secondary | ICD-10-CM

## 2013-08-22 DIAGNOSIS — M25539 Pain in unspecified wrist: Secondary | ICD-10-CM

## 2013-08-22 NOTE — Progress Notes (Signed)
CC: Multiple musculoskeletal complaints including right wrist pain, bilateral knee pain, low back pain, shoulder pain HPI: Patient is a pleasant 47 year old female who presents with multiple musculoskeletal complaints today. She states that the things that are bothering her the most are her right wrist and her low back so we opted to focus on those complaints today. With regards to her low back pain she states that she was diagnosed with sciatica last November. She notes that she has a lot of pain in her mid low back if she stands up for long period of time. She does sometimes say the pain radiates into her right gluteus and occasionally to the posterior thigh. She does feel like sometimes there is some numbness over her gluteus. She improves if she rubs on the skin there. She has been prescribed meloxicam, tramadol, and gabapentin in the past without improvement. She states that the Roxicet did help her. She was previously referred to physical therapy but only had one session due to Medicaid coverage issues. Denies bowel or bladder symptoms.  Patient's other complaint today is right wrist pain. She complains of pain all the way around the circumference of the wrist. She states that sometimes the pain improves if she blows on her skin. Pain does radiate into her fingers at times. She denies any numbness or tingling. She does have decreased grip strength due to pain.  ROS: As above in the HPI. All other systems are stable or negative.  PMH: Obesity, tobacco abuse   Social: Patient does not work. She is currently applying for disability. She does smoke cigarettes. She does not drink alcohol. Allergies: No known drug allergies    OBJECTIVE: APPEARANCE:  Patient in no acute distress.The patient appeared well nourished and normally developed. HEENT: No scleral icterus. Conjunctiva non-injected Resp: Non labored Skin: No rash MSK:  Back Exam: - FROM in flexion, extension, lateral bending, rotation -  TTP at midline spinous processes of lumbar spine - TTP in low back musculature and along iliac crest - SLR is negative - Strength 5/5 bilaterally in lower legs - Reflexes 2+ bilaterally at knee and achilles - Neurovasc intact  Right Wrist: Inspection normal with no visible erythema or swelling. ROM smooth and normal with good flexion and extension and ulnar/radial deviation that is symmetrical with opposite wrist. Palpation is normal over metacarpals, navicular, lunate, and TFCC; tendons without tenderness/ swelling Strength 5/5 in all directions without pain. Tinel's test creates pain radiating up the forearm.  Gait: - Hindfoot varus - Patient has external rotation of her feet with walking - External rotation of the knees with walking - Good hip range of motion in internal and external rotation  ASSESSMENT: #1. Chronic low back pain, axial predominant  #2. Right wrist pain, possibly consistent with early carpal tunnel syndrome versus arthritis #3. Bilateral chronic knee pain with external rotation of knees and feet with walking suggestive of possible lateral compartment arthritis  PLAN: We'll obtain x-rays of the right wrist, bilateral knees, and low back. Patient was given home exercises to work on for her low back. She may continue her medications as prescribed by her PCP. I do think that the predominant part of her low back pain is axial in nature. I do not suspect neuropathic pain at this time. I do think she would benefit from physical therapy but unfortunately due to insurance limitations this is not an option for her at this time. I do think that her malalignment of her feet and knees could be  contributing to her low back pain so we will also obtain x-rays of her knees to evaluate for osteoarthritis. I think we will have a much better idea of where to go with this following her x-rays were read as the degree of degenerative change. I did place the patient in a wrist splint to wear  at night and during a lot of wrist activity . She will followup in one month.

## 2013-08-22 NOTE — Patient Instructions (Signed)
Thank you for coming in today  We will get xrays of your knees, low back, and wrist  Wear wrist splint at night and when using wrist a lot  Do low back exercises 2 x per day  Followup in one month

## 2013-08-28 ENCOUNTER — Ambulatory Visit: Payer: Medicaid Other | Admitting: Family Medicine

## 2013-08-31 ENCOUNTER — Ambulatory Visit
Admission: RE | Admit: 2013-08-31 | Discharge: 2013-08-31 | Disposition: A | Discharge status: Home / Self Care | Payer: Medicaid Other | Source: Ambulatory Visit | Attending: Family Medicine | Admitting: Family Medicine

## 2013-08-31 DIAGNOSIS — M545 Low back pain, unspecified: Secondary | ICD-10-CM

## 2013-08-31 DIAGNOSIS — M25569 Pain in unspecified knee: Secondary | ICD-10-CM

## 2013-08-31 DIAGNOSIS — M25531 Pain in right wrist: Secondary | ICD-10-CM

## 2013-09-26 ENCOUNTER — Encounter: Payer: Self-pay | Admitting: Family Medicine

## 2013-09-26 ENCOUNTER — Ambulatory Visit (INDEPENDENT_AMBULATORY_CARE_PROVIDER_SITE_OTHER): Payer: Medicaid Other | Admitting: Family Medicine

## 2013-09-26 VITALS — BP 137/91 | HR 87 | Ht 69.0 in | Wt 300.0 lb

## 2013-09-26 DIAGNOSIS — G8929 Other chronic pain: Secondary | ICD-10-CM

## 2013-09-26 DIAGNOSIS — M7712 Lateral epicondylitis, left elbow: Secondary | ICD-10-CM

## 2013-09-26 DIAGNOSIS — G5601 Carpal tunnel syndrome, right upper limb: Secondary | ICD-10-CM

## 2013-09-26 DIAGNOSIS — M25569 Pain in unspecified knee: Secondary | ICD-10-CM

## 2013-09-26 DIAGNOSIS — M7710 Lateral epicondylitis, unspecified elbow: Secondary | ICD-10-CM

## 2013-09-26 DIAGNOSIS — G5600 Carpal tunnel syndrome, unspecified upper limb: Secondary | ICD-10-CM

## 2013-09-26 MED ORDER — METHYLPREDNISOLONE ACETATE 40 MG/ML IJ SUSP
40.0000 mg | Freq: Once | INTRAMUSCULAR | Status: AC
  Administered 2013-09-26: 40 mg via INTRA_ARTICULAR

## 2013-09-26 NOTE — Progress Notes (Signed)
CC: Followup multiple musculoskeletal complaints HPI: Patient returns for followup today. She has multiple complaints again today. She continues to have persistent bilateral knee pain, left greater than right. She has been doing exercises at home which unfortunately have not been very helpful for her. She did have x-rays which showed some lateral compartment DJD and joint space narrowing which we anticipated based on her valgus knee alignment. She is currently taking Mobic, gabapentin, and Flexeril for her multiple musculoskeletal problems including low back pain that is chronic. She is also here for followup of her right wrist pain which I felt was related to carpal tunnel. She has been wearing a cockup wrist splint which she does find helpful. She is wearing it at night as well as while lifting her granddaughter. She has somewhat less wrist pain but still has pain at times. She is also complaining of left elbow pain at the area of the lateral epicondyle. Does sometimes radiate down her forearm. She denies any injury or trigger.  ROS: As above in the HPI. All other systems are stable or negative.  OBJECTIVE: APPEARANCE:  Patient in no acute distress.The patient appeared well nourished and normally developed. HEENT: No scleral icterus. Conjunctiva non-injected Resp: Non labored Skin: No rash MSK:  Left knee: Full range of motion from 0-135. No obvious effusion. Tenderness to palpation along the lateral joint line. Valgus knee alignment with external rotation of knees and feet while walking. Right wrist: Percussion of the wrist recreates patient's pain with radiation into the fingers. No thenar atrophy. Normal grip strength. Normal thumb opposition. Left elbow: Tenderness to palpation at the lateral epicondyle. Patient's pain is reproduced by resisted wrist extension.  ASSESSMENT: #1. 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 Right  carpal tunnel syndrome, somewhat improved in carpal tunnel brace #3. Left lateral epicondylitis, mild   PLAN: #1. Patient will like to go forward with corticosteroid injection to the left knee today. Risks and benefits explained. I've also recommended that she obtain a knee compression sleeve. Unfortunately she is unable to obtain an unloading brace or physical therapy due to her health insurance. However, we will try placing sport insoles with a medial wedge into her shoes. #2. We will continue the patient's cockup wrist splint. I did offer her corticosteroid injection today however she declined at this time. Consider again in the future. #3. Patient was given wrist extension and flexion as well as pronation and supination exercises. She was instructed to ice 3 times per day. Followup in 4 weeks.  Consent obtained and verified.  Time-out conducted.  Noted no overlying erythema, induration, or other signs of local infection.  Skin prepped in a sterile fashion.  Topical analgesic spray: Ethyl chloride.  Joint: Left knee Needle: 22-gauge 1-1/2 inch Completed without difficulty.  Meds: 4 cc 1% lidocaine, 1 cc depomedrol Advised to call if fevers/chills, erythema, induration, drainage, or persistent bleeding.

## 2013-09-26 NOTE — Patient Instructions (Signed)
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 Although the condition is often called tennis or golfer's elbow, it is caused by any repetitive action performed by your elbow. HOME CARE INSTRUCTIONS  If the condition has been short lived, rest may be the only treatment required. Using your opposite hand or arm to perform the task may help. Even changing your grip may help rest the extremity. These may even prevent the condition from recurring.  Longer standing problems, however, will often be relieved faster by:  Using anti-inflammatory agents.  Applying ice packs for 30 minutes at the end of the working day, at bed time, or when activities are finished.  Your caregiver may also have you wear a splint or sling. This will allow the inflamed tendon to heal. At times, steroid injections aided with a local anesthetic will be required along with splinting for 1 to 2 weeks. Two to three steroid injections will often solve the problem. In some long standing cases, the inflamed tendon does not respond to conservative (non-surgical) therapy. Then surgery may be required to repair it. MAKE SURE YOU:   Understand these instructions.  Will watch your condition.  Will get help right away if you are not doing  well or get worse. Document Released: 11/16/2005 Document Revised: 02/08/2012 Document Reviewed: 07/04/2008 [facility] Patient Information 2014 [place], [place].

## 2013-09-27 ENCOUNTER — Emergency Department (HOSPITAL_COMMUNITY)
Admission: EM | Admit: 2013-09-27 | Discharge: 2013-09-27 | Disposition: A | Discharge status: Home / Self Care | Payer: Medicaid Other | Attending: Emergency Medicine | Admitting: Emergency Medicine

## 2013-09-27 ENCOUNTER — Encounter (HOSPITAL_COMMUNITY): Payer: Self-pay | Admitting: Emergency Medicine

## 2013-09-27 ENCOUNTER — Ambulatory Visit: Payer: Medicaid Other | Admitting: Family Medicine

## 2013-09-27 DIAGNOSIS — I10 Essential (primary) hypertension: Secondary | ICD-10-CM | POA: Insufficient documentation

## 2013-09-27 DIAGNOSIS — Z79899 Other long term (current) drug therapy: Secondary | ICD-10-CM | POA: Insufficient documentation

## 2013-09-27 DIAGNOSIS — M79605 Pain in left leg: Secondary | ICD-10-CM

## 2013-09-27 DIAGNOSIS — M79609 Pain in unspecified limb: Secondary | ICD-10-CM | POA: Insufficient documentation

## 2013-09-27 DIAGNOSIS — M129 Arthropathy, unspecified: Secondary | ICD-10-CM | POA: Insufficient documentation

## 2013-09-27 DIAGNOSIS — F17200 Nicotine dependence, unspecified, uncomplicated: Secondary | ICD-10-CM | POA: Insufficient documentation

## 2013-09-27 DIAGNOSIS — IMO0002 Reserved for concepts with insufficient information to code with codable children: Secondary | ICD-10-CM | POA: Insufficient documentation

## 2013-09-27 DIAGNOSIS — Z791 Long term (current) use of non-steroidal anti-inflammatories (NSAID): Secondary | ICD-10-CM | POA: Insufficient documentation

## 2013-09-27 MED ORDER — OXYCODONE-ACETAMINOPHEN 5-325 MG PO TABS
2.0000 | ORAL_TABLET | Freq: Once | ORAL | Status: AC
  Administered 2013-09-27: 2 via ORAL
  Filled 2013-09-27: qty 2

## 2013-09-27 MED ORDER — OXYCODONE-ACETAMINOPHEN 5-325 MG PO TABS: 1.0000 | ORAL_TABLET | ORAL | Status: DC | PRN

## 2013-09-27 NOTE — ED Notes (Signed)
Presents with left leg pain that began after getting a cortisone shot at doctors office in left knee. Pain developed quickly and pt reports inability to lay down due to pain over entire leg. CMS intact.

## 2013-09-27 NOTE — ED Provider Notes (Signed)
CSN: [id number]     Arrival date & time 09/27/13  0014 History   First MD Initiated Contact with Patient 09/27/13 0117     Chief Complaint  Patient presents with  . Leg Pain   (Consider location/radiation/quality/duration/timing/severity/associated sxs/prior Treatment) HPI History provided by pt.   Pt had a cortisone injection L medial knee yesterday afternoon and has had severe pain that radiates both up and down her leg ever since.  She is unable to bear weight.  No associated fever, paresthesias or other atypical arthralgias.  Has not taken anything for the pain. Past Medical History  Diagnosis Date  . Hypertension   . Arthritis   . Back pain    History reviewed. No pertinent past surgical history. History reviewed. No pertinent family history. History  Substance Use Topics  . Smoking status: Current Every Day Smoker -- 0.25 packs/day    Types: Cigarettes  . Smokeless tobacco: Not on file     Comment: using electronic cigs with regular cigs  . Alcohol Use: No   OB History   Grav Para Term Preterm Abortions TAB SAB Ect Mult Living                 Review of Systems  All other systems reviewed and are negative.    Allergies  Review of patient's allergies indicates no known allergies.  Home Medications   Current Outpatient Rx  Name  Route  Sig  Dispense  Refill  . cyclobenzaprine (FLEXERIL) 10 MG tablet   Oral   Take 1 tablet (10 mg total) by mouth 2 (two) times daily as needed for muscle spasms.   20 tablet   1   . meloxicam (MOBIC) 15 MG tablet   Oral   Take 1 tablet (15 mg total) by mouth daily.   30 tablet   5   . oxyCODONE-acetaminophen (ROXICET) 5-325 MG per tablet   Oral   Take 1 tablet by mouth 2 (two) times daily.   20 tablet   0   . predniSONE (DELTASONE) 20 MG tablet   Oral   Take 3 tablets (60 mg total) by mouth daily.   15 tablet   0   . pregabalin (LYRICA) 50 MG capsule   Oral   Take 1 capsule (50 mg total) by mouth 2 (two) times  daily.   60 capsule   0   . traMADol (ULTRAM) 50 MG tablet   Oral   Take 1 tablet (50 mg total) by mouth every 8 (eight) hours as needed for pain. Take 1 tablet by mouth twice daily as needed for pain   30 tablet   1    BP 132/68  Pulse 95  Temp(Src) 98.2 F (36.8 C) (Oral)  Resp 18  SpO2 98% Physical Exam  Nursing note and vitals reviewed. Constitutional: She is oriented to person, place, and time. She appears well-developed and well-nourished.  Pt appears uncomfortable  HENT:  Head: Normocephalic and atraumatic.  Eyes:  Normal appearance  Neck: Normal range of motion.  Pulmonary/Chest: Effort normal.  Musculoskeletal: Normal range of motion.  Injection site medial and inferior to L patella.  No erythema/edema of knee.  Entire LLE ttp.  Pain w/ passive flexion of knee.  Nml hip/ankle.  2+ DP pulse and distal sensation intact.    Neurological: She is alert and oriented to person, place, and time.  Psychiatric: She has a normal mood and affect. Her behavior is normal.    ED Course  Procedures (including critical care time) Labs Review Labs Reviewed - No data to display Imaging Review No results found.  EKG Interpretation   None       MDM   1. Leg pain, left    46yo F had a cortisone injection of L knee yesterday afternoon and has had severe pain ever since.  Unable to bear weight.  LLE exam:  No skin changes or edema, diffuse ttp, pain w/ passive ROM knee, NV intact.  Pt reassured that this is an adverse effect of  and pain will gradually improve.  Will treat symptomatically.  2 percocet ordered for pain.  2:13 AM   Pain improved.  Ortho tech provided w/ knee immobilizer and pt d/c'd home w/ percocet.  Recommended f/u with ortho if no improvement in 2-3d. Return precautions discussed.    Arie Sabina Esau Fridman, PA-C 09/27/13 0700

## 2013-09-27 NOTE — Discharge Instructions (Signed)
Take percocet as needed for severe pain.  Do not drive within four hours of taking this medication (may cause drowsiness or confusion).    Ice 2-3 times a day for 15-20 minutes. Elevate as often as possible.  Avoid activities that aggravate pain.   Follow up with the orthopedist if no improvement by Friday.  Return to the ER if you develop fever or redness/heat in your knee.

## 2013-09-27 NOTE — ED Provider Notes (Signed)
Medical screening examination/treatment/procedure(s) were performed by non-physician practitioner and as supervising physician I was immediately available for consultation/collaboration.  Lyanne Co, MD 09/27/13 [phone]

## 2013-09-27 NOTE — Progress Notes (Signed)
Orthopedic Tech Progress Note Patient Details:  Melanie Phillips Oct 04, 1966 [id number]  Ortho Devices Type of Ortho Device: Knee Immobilizer Ortho Device/Splint Interventions: Application   Cammer, Mickie Bail 09/27/2013, 3:32 AM

## 2013-09-28 ENCOUNTER — Other Ambulatory Visit: Payer: Self-pay | Admitting: Family Medicine

## 2013-09-28 MED ORDER — PREGABALIN 50 MG PO CAPS: 50.0000 mg | ORAL_CAPSULE | Freq: Two times a day (BID) | ORAL | Status: DC

## 2013-10-05 ENCOUNTER — Telehealth: Payer: Self-pay | Admitting: *Deleted

## 2013-10-05 NOTE — Telephone Encounter (Signed)
Received request from [facility]---Lyrica needs prior authorization from [facility].  Form and preferred med list placed in Dr. Algis Downs box for completion.  Gaylene Brooks, RN

## 2013-10-05 NOTE — Telephone Encounter (Signed)
Form completed and returned to [place].  Failed Gabapentin due to intolerance.  Cashton Hosley M. Alysia Scism, M.D.

## 2013-10-05 NOTE — Telephone Encounter (Signed)
Prior authorization info completed via [employer] [employer].  Lyrica approved for 10/05/13-10/05/14.  Confirmation # [id number].  Will notify pharmacy.  Gaylene Brooks, RN

## 2013-10-24 ENCOUNTER — Ambulatory Visit: Payer: Medicaid Other | Admitting: Family Medicine

## 2014-07-10 ENCOUNTER — Ambulatory Visit: Payer: Self-pay

## 2014-08-10 ENCOUNTER — Encounter (HOSPITAL_COMMUNITY): Payer: Self-pay | Admitting: Emergency Medicine

## 2014-08-10 ENCOUNTER — Emergency Department (INDEPENDENT_AMBULATORY_CARE_PROVIDER_SITE_OTHER)
Admission: EM | Admit: 2014-08-10 | Discharge: 2014-08-10 | Disposition: A | Discharge status: Home / Self Care | Payer: PRIVATE HEALTH INSURANCE | Source: Home / Self Care | Attending: Family Medicine | Admitting: Family Medicine

## 2014-08-10 ENCOUNTER — Emergency Department (HOSPITAL_COMMUNITY): Payer: No Typology Code available for payment source

## 2014-08-10 ENCOUNTER — Inpatient Hospital Stay (HOSPITAL_COMMUNITY)
Admission: EM | Admit: 2014-08-10 | Discharge: 2014-08-13 | DRG: 639 | Disposition: A | Discharge status: Home / Self Care | Payer: No Typology Code available for payment source | Attending: Family Medicine | Admitting: Family Medicine

## 2014-08-10 DIAGNOSIS — E1310 Other specified diabetes mellitus with ketoacidosis without coma: Secondary | ICD-10-CM

## 2014-08-10 DIAGNOSIS — R030 Elevated blood-pressure reading, without diagnosis of hypertension: Secondary | ICD-10-CM

## 2014-08-10 DIAGNOSIS — R634 Abnormal weight loss: Secondary | ICD-10-CM | POA: Diagnosis present

## 2014-08-10 DIAGNOSIS — E669 Obesity, unspecified: Secondary | ICD-10-CM | POA: Diagnosis present

## 2014-08-10 DIAGNOSIS — M549 Dorsalgia, unspecified: Secondary | ICD-10-CM | POA: Diagnosis present

## 2014-08-10 DIAGNOSIS — IMO0001 Reserved for inherently not codable concepts without codable children: Secondary | ICD-10-CM

## 2014-08-10 DIAGNOSIS — Z6836 Body mass index (BMI) 36.0-36.9, adult: Secondary | ICD-10-CM

## 2014-08-10 DIAGNOSIS — E119 Type 2 diabetes mellitus without complications: Secondary | ICD-10-CM

## 2014-08-10 DIAGNOSIS — E1165 Type 2 diabetes mellitus with hyperglycemia: Secondary | ICD-10-CM

## 2014-08-10 DIAGNOSIS — R748 Abnormal levels of other serum enzymes: Secondary | ICD-10-CM | POA: Diagnosis present

## 2014-08-10 DIAGNOSIS — E1110 Type 2 diabetes mellitus with ketoacidosis without coma: Secondary | ICD-10-CM

## 2014-08-10 DIAGNOSIS — G8929 Other chronic pain: Secondary | ICD-10-CM | POA: Diagnosis present

## 2014-08-10 DIAGNOSIS — E1101 Type 2 diabetes mellitus with hyperosmolarity with coma: Secondary | ICD-10-CM

## 2014-08-10 DIAGNOSIS — Z833 Family history of diabetes mellitus: Secondary | ICD-10-CM

## 2014-08-10 DIAGNOSIS — F17200 Nicotine dependence, unspecified, uncomplicated: Secondary | ICD-10-CM

## 2014-08-10 LAB — POCT I-STAT, CHEM 8
BUN: 6 mg/dL (ref 6–23)
CALCIUM ION: 1.19 mmol/L (ref 1.12–1.23)
Chloride: 99 mEq/L (ref 96–112)
Creatinine, Ser: 0.6 mg/dL (ref 0.50–1.10)
Glucose, Bld: 581 mg/dL (ref 70–99)
HEMATOCRIT: 45 % (ref 36.0–46.0)
HEMOGLOBIN: 15.3 g/dL — AB (ref 12.0–15.0)
Potassium: 3.8 mEq/L (ref 3.7–5.3)
Sodium: 132 mEq/L — ABNORMAL LOW (ref 137–147)
TCO2: 24 mmol/L (ref 0–100)

## 2014-08-10 LAB — CBC
HCT: 42.5 % (ref 36.0–46.0)
HEMOGLOBIN: 14.7 g/dL (ref 12.0–15.0)
MCH: 31.3 pg (ref 26.0–34.0)
MCHC: 34.6 g/dL (ref 30.0–36.0)
MCV: 90.6 fL (ref 78.0–100.0)
Platelets: 366 10*3/uL (ref 150–400)
RBC: 4.69 MIL/uL (ref 3.87–5.11)
RDW: 13.8 % (ref 11.5–15.5)
WBC: 6.5 10*3/uL (ref 4.0–10.5)

## 2014-08-10 LAB — COMPREHENSIVE METABOLIC PANEL
ALK PHOS: 206 U/L — AB (ref 39–117)
ALT: 12 U/L (ref 0–35)
ANION GAP: 19 — AB (ref 5–15)
AST: 11 U/L (ref 0–37)
Albumin: 3.2 g/dL — ABNORMAL LOW (ref 3.5–5.2)
BUN: 8 mg/dL (ref 6–23)
CALCIUM: 9.6 mg/dL (ref 8.4–10.5)
CO2: 19 mEq/L (ref 19–32)
Chloride: 92 mEq/L — ABNORMAL LOW (ref 96–112)
Creatinine, Ser: 0.54 mg/dL (ref 0.50–1.10)
GFR calc non Af Amer: >90 mL/min (ref >90)
GLUCOSE: 632 mg/dL — AB (ref 70–99)
POTASSIUM: 4.2 meq/L (ref 3.7–5.3)
Sodium: 130 mEq/L — ABNORMAL LOW (ref 137–147)
Total Bilirubin: 0.2 mg/dL — ABNORMAL LOW (ref 0.3–1.2)
Total Protein: 6.6 g/dL (ref 6.0–8.3)

## 2014-08-10 LAB — URINALYSIS, ROUTINE W REFLEX MICROSCOPIC
BILIRUBIN URINE: NEGATIVE
Glucose, UA: >1000 mg/dL — AB
Ketones, ur: NEGATIVE mg/dL
NITRITE: NEGATIVE
Protein, ur: NEGATIVE mg/dL
SPECIFIC GRAVITY, URINE: 1.046 — AB (ref 1.005–1.030)
UROBILINOGEN UA: 0.2 mg/dL (ref 0.0–1.0)
pH: 5.5 (ref 5.0–8.0)

## 2014-08-10 LAB — CBG MONITORING, ED: Glucose-Capillary: 578 mg/dL (ref 70–99)

## 2014-08-10 LAB — URINE MICROSCOPIC-ADD ON

## 2014-08-10 MED ORDER — OXYCODONE-ACETAMINOPHEN 5-325 MG PO TABS
1.0000 | ORAL_TABLET | Freq: Once | ORAL | Status: AC
  Administered 2014-08-10: 1 via ORAL
  Filled 2014-08-10: qty 1

## 2014-08-10 MED ORDER — POTASSIUM CHLORIDE 10 MEQ/100ML IV SOLN
10.0000 meq | INTRAVENOUS | Status: AC
  Administered 2014-08-10: 10 meq via INTRAVENOUS
  Filled 2014-08-10 (×2): qty 100

## 2014-08-10 MED ORDER — SODIUM CHLORIDE 0.9 % IV BOLUS (SEPSIS): 2000.0000 mL | Freq: Once | INTRAVENOUS | Status: DC

## 2014-08-10 MED ORDER — SODIUM CHLORIDE 0.9 % IV SOLN
1000.0000 mL | Freq: Once | INTRAVENOUS | Status: AC
  Administered 2014-08-10: 1000 mL via INTRAVENOUS

## 2014-08-10 MED ORDER — ENOXAPARIN SODIUM 40 MG/0.4ML ~~LOC~~ SOLN
40.0000 mg | SUBCUTANEOUS | Status: DC
  Administered 2014-08-10: 40 mg via SUBCUTANEOUS
  Filled 2014-08-10: qty 0.4

## 2014-08-10 MED ORDER — DEXTROSE-NACL 5-0.45 % IV SOLN
INTRAVENOUS | Status: DC
  Administered 2014-08-11: 02:00:00 via INTRAVENOUS

## 2014-08-10 MED ORDER — SODIUM CHLORIDE 0.9 % IV SOLN
INTRAVENOUS | Status: DC
  Administered 2014-08-10: 23:00:00 via INTRAVENOUS

## 2014-08-10 MED ORDER — OXYCODONE-ACETAMINOPHEN 5-325 MG PO TABS
1.0000 | ORAL_TABLET | Freq: Every day | ORAL | Status: DC | PRN
  Administered 2014-08-11: 1 via ORAL
  Filled 2014-08-10: qty 1

## 2014-08-10 MED ORDER — SODIUM CHLORIDE 0.9 % IV SOLN
INTRAVENOUS | Status: DC
  Administered 2014-08-10: 3 [IU]/h via INTRAVENOUS
  Filled 2014-08-10: qty 2.5

## 2014-08-10 MED ORDER — METRONIDAZOLE 500 MG PO TABS
2000.0000 mg | ORAL_TABLET | Freq: Once | ORAL | Status: AC
  Administered 2014-08-10: 2000 mg via ORAL
  Filled 2014-08-10: qty 4

## 2014-08-10 MED ORDER — DEXTROSE 50 % IV SOLN: 25.0000 mL | INTRAVENOUS | Status: DC | PRN

## 2014-08-10 MED ORDER — SODIUM CHLORIDE 0.9 % IV BOLUS (SEPSIS)
1000.0000 mL | Freq: Once | INTRAVENOUS | Status: DC
Start: 2014-08-10 — End: 2014-08-10

## 2014-08-10 NOTE — H&P (Signed)
FMTS Attending Admission Note: Jeff Walden MD Personal pager:  [phone] FPTS Service Pager:  [phone]  47 yo F with obesity and chronic back pain who presents with about 5 months of 40 lb weight loss and increasing polyuria/polydipsia/nocturia.  Presented to Urgent Care today, told she had diabetes, new-onset.  Came to ED due to persistence of symptoms.  Found to have CBG >600.  Started on fluids in ED.  FPTS called for admission.    On exam, she is lying in bed, crying b/c of diagnosis.  Consolable.  Heart RRR. Lungs clear.  Abdomen obese but without tenderness.  Legs with no edema noted, but obese.  Has +2 pulses BL DP.  No focal deficits on neuro exam.    Imp/Plan: 1. DM2: - new onset - strong family history of same - starting glucomander and admitting to stepdown.  - BMETs until gap closes.  IV drip insulin until gap closes, then wait hour and switch to subQ Lantus.  Usual diabetes regimen as per resident note  2.  Elevated alk phos: - no abdominal pain.  - likely incidental.  AST/ALT good.   - repeat once CBGs under good control.  I will sign the resident's note once completed.   Jeffrey H Walden, MD 08/10/2014 5:43 PM   

## 2014-08-10 NOTE — ED Notes (Signed)
States has lost 80 lb w/o trying, increased thirst, frequent urination, diabetes in family; has appt to see her MD at [facility] on 9-16

## 2014-08-10 NOTE — Discharge Instructions (Signed)
Go to [facility] today for diabetes care.

## 2014-08-10 NOTE — ED Notes (Addendum)
Pt just found out today that she is a new diabetic. Was at [facility] and sugar was close to 700. Has been having problems with her vision, urinating frequently, continue to get yeast infections, and dry mouth. Pt also c/o right hip pain

## 2014-08-10 NOTE — H&P (Signed)
[facility] Admission History and Physical Service Pager: [phone]  Patient name: Melanie Phillips Medical record number: [id number] Date of birth: Mar 03, 1966 Age: 48 y.o. Gender: female  Primary Care Provider: Diona Fanti, DO Consultants: None Code Status: Full Code  Chief Complaint: Polyuria, Polydipsia, Weight loss  Assessment and Plan: Melanie Phillips is a 48 y.o. female presenting with profound hyperglycemia and elevated AG consistent with DKA vs HHS.  Past medical history is significant for obesity and tobacco abuse.  Hyperglycemia, New onset DM:  DKA vs HHS -  Patient with significant weight loss, polyuria and polydipsia consistent with new onset DM.  Blood sugar profoundly elevated on admission. AG of 19.  No VBG, urine ketones, or serum ketones were done, so I'm unsure if this is DKA versus HHS.  However, the treatment for both entities is essentially the same.  - Admit to SDU - VBG, A1C  - DKA protocol - Glucomander, IV fluids (NS @ 125 hour until CBG's <250 at which time will switch to D5 1/2 NS @ 100 mL/hr)  - 2 runs of KCl given per protocol - BMP Q2 hours - Once AG is close will transition to basal bolus insulin - Diabetic teaching/Diabetes coordinator consult  Tobacco abuse  - Cessation counseling  Elevated BP - No hx of HTN - Will monitor closely during admission - Will need outpatient follow up  Elevated Alk Phos - Unclear etiology.  No abdominal pain or tenderness w/ exam.  - Will repeat during hospitalization to reassess  FEN/GI: NPO; IV fluids per DKA protocol Prophylaxis: Lovenox  Disposition: SDU; Pending stabilization of blood glucose and diabetic teaching.   History of Present Illness:  Melanie Phillips is a 48 y.o. female with a past medical history of obesity and tobacco abuse who presents from urgent care with profound hyperglycemia.  Patient reports that she has been feeling poorly for the past few months.  She  notes that she has had profound weight loss for approximately 5 months. She states that in November she weighed "310 pounds" and that her weight today was 245 pounds (In EMR, office visit on 07/19/13 weight was 304 lb; current weight in ED was 244.3 lb).  She reports that for the past month she's been experiencing significant polyuria and polydipsia.  She reports that "I'm drinking fluids  all the time" and she states she gets up numerous times at night use restroom.  She denies any chest pain, nausea, vomiting. She reports good PO intake.  Patient does endorse some shortness of breath with physical activity.  She denies any associated lower extremity edema, cough, fevers, chills.  In the ED, CBG was 578.  CMP was notable for an elevated blood glucose of 632, and anion gap of 19.  CBC within normal limits.  Chest x-ray negative.   Review Of Systems: Per HPI.  Otherwise 12 point review of systems was performed and was unremarkable.  Patient Active Problem List   Diagnosis Date Noted  . Sciatica 05/31/2012  . 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 NONALLOPATHIC LESION OF RIB CAGE NEC 11/20/2010  . OBESITY, UNSPECIFIED 03/07/2010  . TOBACCO ABUSE 03/07/2010  . PAIN IN JOINT, MULTIPLE SITES 03/07/2010   Past Medical History: Past Medical History  Diagnosis Date  . Hypertension   . Arthritis   . Back pain    Past Surgical History: Significant for tubal ligation.  Social History: History  Substance Use Topics  . Smoking status: Current Every Day  Smoker -- 0.25 packs/day    Types: Cigarettes  . Smokeless tobacco: Not on file     Comment: using electronic cigs with regular cigs  . Alcohol Use: No   Please also refer to relevant sections of EMR.  Family History: No family history on file. Allergies and Medications: No Known Allergies No current facility-administered medications on file prior to encounter.   No current outpatient prescriptions on file prior to encounter.    Objective: BP 132/93  Pulse 63  Resp 16  Wt 244 lb 3 oz (110.763 kg)  SpO2 100% Exam: General: Well-appearing obese female resting in bed. No acute distress. HEENT: Slightly dry mucous membranes. Poor dentition. Oropharynx clear. Cardiovascular: Regular rate and rhythm. No murmurs, rubs, gallops appreciated. Respiratory: Mild basilar rales noted. No other adventitious sounds noted. Normal work of breathing. Abdomen: Obese, protuberant.  Soft, nontender. No palpable masses or organomegaly. Extremities: No LE edema noted. Warm, well perfused. Skin: Warm, dry, intact Neuro: No focal deficits on exam.  Labs and Imaging: CBC BMET   Recent Labs Lab 08/10/14 1428  WBC 6.5  HGB 14.7  HCT 42.5  PLT 366    Recent Labs Lab 08/10/14 1428  NA 130*  K 4.2  CL 92*  CO2 19  BUN 8  CREATININE 0.54  GLUCOSE 632*  CALCIUM 9.6     Dg Chest 2 View 08/10/2014  IMPRESSION: No edema or consolidation.    Coral Spikes, DO 08/10/2014, 5:40 PM PGY-3, [place] Intern pager: [phone], text pages welcome

## 2014-08-10 NOTE — ED Provider Notes (Signed)
CSN: [id number]     Arrival date & time 08/10/14  1408 History   First MD Initiated Contact with Patient 08/10/14 1500     Chief Complaint  Patient presents with  . Hyperglycemia     (Consider location/radiation/quality/duration/timing/severity/associated sxs/prior Treatment) Patient is a 48 y.o. female presenting with hyperglycemia.  Hyperglycemia Associated symptoms: increased thirst, nausea, polyuria and shortness of breath (on exertion)   Associated symptoms: no abdominal pain, no chest pain, no confusion, no dysuria, no fever and no vomiting    Pt is a 48 y/o female w/ no significant PMHx who presents to the ED for elevated blood glucose that was found while at urgent care today. She was seen at urgent care for polydipsia, polyuria, and jitteriness. Her BMP there revealed glucose of 581. She was d/c'd with an appt at [facility] however they could not fit her in today. She then came to [facility] ED for glucose follow up. She has nausea but no vomiting. She denies fever, night sweats, chills, dysuria, confusion, recent illness or sick contacts, chest pain, and sore throat. She has never been dx w/ diabetes before but her mother and sister have diabetes.   Her PCP is Dr. Gerarda Fraction w/ Family Medicine, however they did not have an available appointment for her today. She has an appointment with her PCP 08/15/14 at 2:00pm.  Past Medical History  Diagnosis Date  . Hypertension   . Arthritis   . Back pain   . Diabetes mellitus without complication    History reviewed. No pertinent past surgical history. No family history on file. History  Substance Use Topics  . Smoking status: Current Every Day Smoker -- 0.25 packs/day    Types: Cigarettes  . Smokeless tobacco: Not on file     Comment: using electronic cigs with regular cigs  . Alcohol Use: No   OB History   Grav Para Term Preterm Abortions TAB SAB Ect Mult Living                 Review of Systems  Constitutional:  Negative for fever and chills.  HENT: Positive for rhinorrhea.   Eyes: Positive for visual disturbance.  Respiratory: Positive for shortness of breath (on exertion).   Cardiovascular: Negative for chest pain.  Gastrointestinal: Positive for nausea. Negative for vomiting, abdominal pain and diarrhea.  Endocrine: Positive for polydipsia and polyuria.  Genitourinary: Negative for dysuria.  Musculoskeletal: Positive for back pain.  Neurological: Negative for numbness and headaches.  Psychiatric/Behavioral: Negative for confusion.      Allergies  Review of patient's allergies indicates no known allergies.  Home Medications   Prior to Admission medications   Medication Sig Start Date End Date Taking? Authorizing Provider  ibuprofen (ADVIL,MOTRIN) 200 MG tablet Take 600 mg by mouth every 6 (six) hours as needed for moderate pain.   Yes Historical Provider, MD  naproxen sodium (ANAPROX) 220 MG tablet Take 220 mg by mouth daily as needed (for pain).   Yes Historical Provider, MD   BP 132/93  Pulse 63  Resp 16  Wt 244 lb 3 oz (110.763 kg)  SpO2 100% Physical Exam  Constitutional: She is oriented to person, place, and time. She appears well-developed and well-nourished. No distress.  HENT:  erythematous oropharynx, no exudates, nares WNL  Eyes: Pupils are equal, round, and reactive to light.  Cardiovascular: Normal rate and regular rhythm.   No murmur heard. Pulmonary/Chest: Effort normal and breath sounds normal. She has no wheezes.  Abdominal: Soft. Bowel sounds are normal. There is no tenderness.  Musculoskeletal:  Positive straight leg test on right, tenderness to palpation of rt lateral rib cage  Neurological: She is oriented to person, place, and time.  Intact sensation of feet b/l  Skin: Skin is warm and dry.    ED Course  Procedures (including critical care time) Labs Review Labs Reviewed  COMPREHENSIVE METABOLIC PANEL - Abnormal; Notable for the following:    Sodium  130 (*)    Chloride 92 (*)    Glucose, Bld 632 (*)    Albumin 3.2 (*)    Alkaline Phosphatase 206 (*)    Total Bilirubin 0.2 (*)    Anion gap 19 (*)    All other components within normal limits  CBG MONITORING, ED - Abnormal; Notable for the following:    Glucose-Capillary 578 (*)    All other components within normal limits  CBC  URINALYSIS, ROUTINE W REFLEX MICROSCOPIC  BLOOD GAS, VENOUS  CBG MONITORING, ED    Imaging Review Dg Chest 2 View  08/10/2014   CLINICAL DATA:  Difficulty breathing and cough  EXAM: CHEST  2 VIEW  COMPARISON:  None.  FINDINGS: Lungs are clear. Heart size and pulmonary vascularity are normal. No adenopathy. No bone lesions.  IMPRESSION: No edema or consolidation.   Electronically Signed   By: Lowella Grip M.D.   On: 08/10/2014 17:19    MDM   Final diagnoses:  Diabetic ketoacidosis without coma associated with type 2 diabetes mellitus    Pt presents w/ newly dx diabetes w/ glucose of 632 and anion gap of 19. Likely w/ DKA due to poor diet. No leukocytosis, will get CXR and UA to r/o infectious cause. Will bolus 2L NS and keep NPO. Family Medicine to admit patient.     Julious Oka, MD 08/10/14 [phone]

## 2014-08-10 NOTE — ED Provider Notes (Signed)
I saw and evaluated the patient, reviewed the resident's note and I agree with the findings and plan.   .Face to face Exam:  General:  Awake HEENT:  Atraumatic Resp:  Normal effort Abd:  Nondistended Neuro:No focal weakness Lymph: No adenopathy   CRITICAL CARE Performed by: Leonard Schwartz L Total critical care time: 30 min Critical care time was exclusive of separately billable procedures and treating other patients. Critical care was necessary to treat or prevent imminent or life-threatening deterioration. Critical care was time spent personally by me on the following activities: development of treatment plan with patient and/or surrogate as well as nursing, discussions with consultants, evaluation of patient's response to treatment, examination of patient, obtaining history from patient or surrogate, ordering and performing treatments and interventions, ordering and review of laboratory studies, ordering and review of radiographic studies, pulse oximetry and re-evaluation of patient's condition.   Dot Lanes, MD 08/10/14 [phone]

## 2014-08-10 NOTE — ED Provider Notes (Signed)
CSN: [id number]     Arrival date & time 08/10/14  1111 History   First MD Initiated Contact with Patient 08/10/14 1141     Chief Complaint  Patient presents with  . Weight Loss   (Consider location/radiation/quality/duration/timing/severity/associated sxs/prior Treatment) Patient is a 48 y.o. female presenting with general illness.  Illness Location:  Nov weighed 305, losing wt without cause, pos f/hx of dm., no pain.no sob. Severity:  Moderate Onset quality:  Gradual Progression:  Unchanged Chronicity:  New Associated symptoms: no abdominal pain, no chest pain, no diarrhea, no fever, no nausea and no vomiting     Past Medical History  Diagnosis Date  . Hypertension   . Arthritis   . Back pain    History reviewed. No pertinent past surgical history. History reviewed. No pertinent family history. History  Substance Use Topics  . Smoking status: Current Every Day Smoker -- 0.25 packs/day    Types: Cigarettes  . Smokeless tobacco: Not on file     Comment: using electronic cigs with regular cigs  . Alcohol Use: No   OB History   Grav Para Term Preterm Abortions TAB SAB Ect Mult Living                 Review of Systems  Constitutional: Positive for unexpected weight change. Negative for fever, activity change and appetite change.  HENT: Negative.   Respiratory: Negative.   Cardiovascular: Negative for chest pain and leg swelling.  Gastrointestinal: Negative.  Negative for nausea, vomiting, abdominal pain and diarrhea.  Endocrine: Positive for polydipsia and polyuria.  Genitourinary: Positive for frequency.    Allergies  Review of patient's allergies indicates no known allergies.  Home Medications   Prior to Admission medications   Medication Sig Start Date End Date Taking? Authorizing Provider  cyclobenzaprine (FLEXERIL) 10 MG tablet Take 1 tablet (10 mg total) by mouth 2 (two) times daily as needed for muscle spasms. 06/14/13   Amber Fidel Levy, MD  meloxicam  (MOBIC) 15 MG tablet Take 1 tablet (15 mg total) by mouth daily. 07/19/13   Amber Fidel Levy, MD  oxyCODONE-acetaminophen (PERCOCET/ROXICET) 5-325 MG per tablet Take 1 tablet by mouth every 4 (four) hours as needed for pain. 09/27/13   Arville Lime Schinlever, PA-C  oxyCODONE-acetaminophen (ROXICET) 5-325 MG per tablet Take 1 tablet by mouth 2 (two) times daily. 09/06/12   Josalyn C Funches, MD  predniSONE (DELTASONE) 20 MG tablet Take 3 tablets (60 mg total) by mouth daily. 09/06/12   Tiffany Marilu Favre, PA-C  pregabalin (LYRICA) 50 MG capsule Take 1 capsule (50 mg total) by mouth 2 (two) times daily. 09/28/13   Montez Morita, MD  traMADol (ULTRAM) 50 MG tablet Take 1 tablet (50 mg total) by mouth every 8 (eight) hours as needed for pain. Take 1 tablet by mouth twice daily as needed for pain 04/17/13   Cletus Gash, MD   BP 140/96  Pulse 85  Temp(Src) 98 F (36.7 C) (Oral)  Resp 16  Wt 295 lb (133.811 kg)  SpO2 100% Physical Exam  Nursing note and vitals reviewed. Constitutional: She is oriented to person, place, and time. She appears well-developed and well-nourished. No distress.  HENT:  Mouth/Throat: Oropharynx is clear and moist.  Neck: Normal range of motion. Neck supple.  Cardiovascular: Normal heart sounds and intact distal pulses.   Pulmonary/Chest: Effort normal and breath sounds normal.  Abdominal: Soft. Bowel sounds are normal. She exhibits no mass. There is no tenderness.  Neurological: She  is alert and oriented to person, place, and time.  Skin: Skin is warm and dry.    ED Course  Procedures (including critical care time) Labs Review Labs Reviewed  POCT I-STAT, CHEM 8 - Abnormal; Notable for the following:    Sodium 132 (*)    Glucose, Bld 581 (*)    Hemoglobin 15.3 (*)    All other components within normal limits    Imaging Review No results found.   MDM   1. Type 2 diabetes mellitus with hyperglycemia        Billy Fischer, MD 08/10/14 1238

## 2014-08-11 LAB — TSH: TSH: 3.6 u[IU]/mL (ref 0.350–4.500)

## 2014-08-11 LAB — BASIC METABOLIC PANEL
ANION GAP: 13 (ref 5–15)
ANION GAP: 11 (ref 5–15)
Anion gap: 11 (ref 5–15)
Anion gap: 10 (ref 5–15)
BUN: 6 mg/dL (ref 6–23)
BUN: 7 mg/dL (ref 6–23)
BUN: 6 mg/dL (ref 6–23)
BUN: 6 mg/dL (ref 6–23)
CALCIUM: 8.1 mg/dL — AB (ref 8.4–10.5)
CHLORIDE: 109 meq/L (ref 96–112)
CHLORIDE: 103 meq/L (ref 96–112)
CO2: 22 mEq/L (ref 19–32)
CO2: 20 mEq/L (ref 19–32)
CO2: 20 mEq/L (ref 19–32)
CO2: 23 meq/L (ref 19–32)
CREATININE: 0.46 mg/dL — AB (ref 0.50–1.10)
Calcium: 7.8 mg/dL — ABNORMAL LOW (ref 8.4–10.5)
Calcium: 7.9 mg/dL — ABNORMAL LOW (ref 8.4–10.5)
Calcium: 8.7 mg/dL (ref 8.4–10.5)
Chloride: 106 mEq/L (ref 96–112)
Chloride: 106 mEq/L (ref 96–112)
Creatinine, Ser: 0.51 mg/dL (ref 0.50–1.10)
Creatinine, Ser: 0.46 mg/dL — ABNORMAL LOW (ref 0.50–1.10)
Creatinine, Ser: 0.49 mg/dL — ABNORMAL LOW (ref 0.50–1.10)
GFR calc Af Amer: >90 mL/min (ref >90)
GFR calc Af Amer: >90 mL/min (ref >90)
GFR calc Af Amer: >90 mL/min (ref >90)
GFR calc non Af Amer: >90 mL/min (ref >90)
GFR calc non Af Amer: >90 mL/min (ref >90)
Glucose, Bld: 257 mg/dL — ABNORMAL HIGH (ref 70–99)
Glucose, Bld: 110 mg/dL — ABNORMAL HIGH (ref 70–99)
Glucose, Bld: 195 mg/dL — ABNORMAL HIGH (ref 70–99)
Glucose, Bld: 351 mg/dL — ABNORMAL HIGH (ref 70–99)
POTASSIUM: 4.3 meq/L (ref 3.7–5.3)
Potassium: 3.1 mEq/L — ABNORMAL LOW (ref 3.7–5.3)
Potassium: 3.5 mEq/L — ABNORMAL LOW (ref 3.7–5.3)
Potassium: 4 mEq/L (ref 3.7–5.3)
Sodium: 140 mEq/L (ref 137–147)
Sodium: 141 mEq/L (ref 137–147)
Sodium: 139 mEq/L (ref 137–147)
Sodium: 134 mEq/L — ABNORMAL LOW (ref 137–147)

## 2014-08-11 LAB — KETONES, QUALITATIVE: Acetone, Bld: NEGATIVE

## 2014-08-11 LAB — CBC
HCT: 37.5 % (ref 36.0–46.0)
HEMOGLOBIN: 13.2 g/dL (ref 12.0–15.0)
MCH: 30.8 pg (ref 26.0–34.0)
MCHC: 35.2 g/dL (ref 30.0–36.0)
MCV: 87.6 fL (ref 78.0–100.0)
Platelets: 338 10*3/uL (ref 150–400)
RBC: 4.28 MIL/uL (ref 3.87–5.11)
RDW: 13.4 % (ref 11.5–15.5)
WBC: 6.9 10*3/uL (ref 4.0–10.5)

## 2014-08-11 LAB — LIPID PANEL
Cholesterol: 195 mg/dL (ref 0–200)
HDL: 38 mg/dL — AB (ref >39)
LDL CALC: 125 mg/dL — AB (ref 0–99)
TRIGLYCERIDES: 162 mg/dL — AB (ref <150)
Total CHOL/HDL Ratio: 5.1 RATIO
VLDL: 32 mg/dL (ref 0–40)

## 2014-08-11 LAB — GLUCOSE, CAPILLARY
GLUCOSE-CAPILLARY: 317 mg/dL — AB (ref 70–99)
GLUCOSE-CAPILLARY: 116 mg/dL — AB (ref 70–99)
GLUCOSE-CAPILLARY: 161 mg/dL — AB (ref 70–99)
Glucose-Capillary: 176 mg/dL — ABNORMAL HIGH (ref 70–99)
Glucose-Capillary: 195 mg/dL — ABNORMAL HIGH (ref 70–99)
Glucose-Capillary: 209 mg/dL — ABNORMAL HIGH (ref 70–99)
Glucose-Capillary: 251 mg/dL — ABNORMAL HIGH (ref 70–99)
Glucose-Capillary: 254 mg/dL — ABNORMAL HIGH (ref 70–99)
Glucose-Capillary: 312 mg/dL — ABNORMAL HIGH (ref 70–99)
Glucose-Capillary: 342 mg/dL — ABNORMAL HIGH (ref 70–99)
Glucose-Capillary: 356 mg/dL — ABNORMAL HIGH (ref 70–99)
Glucose-Capillary: 386 mg/dL — ABNORMAL HIGH (ref 70–99)

## 2014-08-11 LAB — MRSA PCR SCREENING: MRSA BY PCR: NEGATIVE

## 2014-08-11 LAB — HEMOGLOBIN A1C
Hgb A1c MFr Bld: 16.2 % — ABNORMAL HIGH (ref <5.7)
MEAN PLASMA GLUCOSE: 418 mg/dL — AB (ref <117)

## 2014-08-11 LAB — C-PEPTIDE: C-Peptide: 1.11 ng/mL (ref 0.80–3.90)

## 2014-08-11 MED ORDER — INSULIN GLARGINE 100 UNIT/ML ~~LOC~~ SOLN
15.0000 [IU] | Freq: Every day | SUBCUTANEOUS | Status: DC
  Administered 2014-08-11: 15 [IU] via SUBCUTANEOUS
  Filled 2014-08-11 (×2): qty 0.15

## 2014-08-11 MED ORDER — METFORMIN HCL 500 MG PO TABS
500.0000 mg | ORAL_TABLET | Freq: Two times a day (BID) | ORAL | Status: DC
  Administered 2014-08-12 – 2014-08-13 (×3): 500 mg via ORAL
  Filled 2014-08-11 (×7): qty 1

## 2014-08-11 MED ORDER — INSULIN ASPART 100 UNIT/ML ~~LOC~~ SOLN
0.0000 [IU] | Freq: Three times a day (TID) | SUBCUTANEOUS | Status: DC
  Administered 2014-08-11: 11 [IU] via SUBCUTANEOUS
  Administered 2014-08-12: 15 [IU] via SUBCUTANEOUS
  Administered 2014-08-12 (×2): 8 [IU] via SUBCUTANEOUS
  Administered 2014-08-13 (×2): 11 [IU] via SUBCUTANEOUS

## 2014-08-11 MED ORDER — INSULIN GLARGINE 100 UNIT/ML ~~LOC~~ SOLN
18.0000 [IU] | Freq: Every day | SUBCUTANEOUS | Status: DC
  Filled 2014-08-11: qty 0.18

## 2014-08-11 MED ORDER — INSULIN ASPART 100 UNIT/ML ~~LOC~~ SOLN
0.0000 [IU] | Freq: Every day | SUBCUTANEOUS | Status: DC
  Administered 2014-08-11: 4 [IU] via SUBCUTANEOUS
  Administered 2014-08-12: 3 [IU] via SUBCUTANEOUS

## 2014-08-11 MED ORDER — POTASSIUM CHLORIDE IN NACL 40-0.9 MEQ/L-% IV SOLN
INTRAVENOUS | Status: DC
  Administered 2014-08-11: 125 mL/h via INTRAVENOUS
  Filled 2014-08-11 (×4): qty 1000

## 2014-08-11 MED ORDER — NICOTINE 14 MG/24HR TD PT24
14.0000 mg | MEDICATED_PATCH | Freq: Every day | TRANSDERMAL | Status: DC
  Administered 2014-08-11 – 2014-08-13 (×3): 14 mg via TRANSDERMAL
  Filled 2014-08-11 (×5): qty 1

## 2014-08-11 MED ORDER — POTASSIUM CHLORIDE CRYS ER 20 MEQ PO TBCR
40.0000 meq | EXTENDED_RELEASE_TABLET | Freq: Two times a day (BID) | ORAL | Status: DC
  Administered 2014-08-11: 40 meq via ORAL
  Filled 2014-08-11 (×3): qty 2

## 2014-08-11 MED ORDER — INSULIN ASPART 100 UNIT/ML ~~LOC~~ SOLN
0.0000 [IU] | Freq: Three times a day (TID) | SUBCUTANEOUS | Status: DC
  Administered 2014-08-11: 5 [IU] via SUBCUTANEOUS
  Administered 2014-08-11: 9 [IU] via SUBCUTANEOUS

## 2014-08-11 MED ORDER — MELOXICAM 7.5 MG PO TABS
7.5000 mg | ORAL_TABLET | Freq: Every day | ORAL | Status: DC
  Administered 2014-08-11 – 2014-08-13 (×3): 7.5 mg via ORAL
  Filled 2014-08-11 (×3): qty 1

## 2014-08-11 MED ORDER — SODIUM CHLORIDE 0.9 % IV SOLN
INTRAVENOUS | Status: DC
  Filled 2014-08-11 (×3): qty 500

## 2014-08-11 NOTE — Progress Notes (Signed)
See my separate note from earlier today

## 2014-08-11 NOTE — Progress Notes (Signed)
Patient started on first run of Potassium. She experienced burning sensation in IV. Potassium rate decreased, heat pack applied. No signs of phlebitis. New IV placed. Fluids moved to new IV. Potassium ran at reduced rate. Potassium came back at 3.1. MD notified. New orders received. Will continue to monitor.   Burchett, Wilma Flavin, RN

## 2014-08-11 NOTE — Progress Notes (Addendum)
Family Medicine Teaching Service Daily Progress Note Intern Pager: [phone]  Patient name: Melanie Phillips Medical record number: [id number] Date of birth: 1966/03/24 Age: 48 y.o. Gender: female  Primary Care Provider: Diona Fanti, DO Consultants: none Code Status: full  Pt Overview and Major Events to Date:  08/10/14: HHS in setting of new diagnosis DM 08/11/14: HgbA1c 16.2 transitioned to lantus and SSI  Assessment and Plan: # HHS in setting of new diagnosis DM: pt presented with overall fatigue and classic symptoms of polyuria and polydipsia along with decreased visual acuity. CBG 600s, AG 19, UA - ketones, and bicarb 20. Pt has sister and mother with DM. Pt has not been tested for DM and was on no medication.  -transitioned to lantus 18 units and mod SSI -DM educator -will need to start metformin at discharge -repeat bmet to trend K -d/c IVF as tolerated regular diet -transfer to medsurg  # Tobacco abuse: nicotine patch, counseled on cessation pt at precontemplation  FEN/GI: carb mod diet PPx: heparin   Disposition: home once stable w/in 1-2 days  Subjective:  NAEON. Pt doing well this AM tolerating full regular diet w/o nausea/vomiting/abdominal pain/diarrhea. Pt states improvement in vision and denied paraesthesias.   Objective: Temp:  [97.9 F (36.6 C)-98.5 F (36.9 C)] 98.5 F (36.9 C) (09/12 0735) Pulse Rate:  [62-86] 71 (09/12 0735) Resp:  [15-22] 15 (09/12 0735) BP: (105-140)/(62-96) 129/90 mmHg (09/12 0735) SpO2:  [93 %-100 %] 100 % (09/12 0735) Weight:  [244 lb 3 oz (110.763 kg)-295 lb (133.811 kg)] 246 lb 4.1 oz (111.7 kg) (09/11 2100) Physical Exam: General: resting in bed, NAD HEENT: PERRL, EOMI, no scleral icterus Cardiac: RRR, no rubs, murmurs or gallops Pulm: clear to auscultation bilaterally, no crackles, wheezes/rhonchi, moving normal volumes of air Abd: soft, obese, nontender, nondistended, BS present Ext: warm and well perfused, no pedal  edema Neuro: alert and oriented X3, cranial nerves II-XII grossly intact   Laboratory:  Recent Labs Lab 08/10/14 1212 08/10/14 1428 08/11/14 0005  WBC  --  6.5 6.9  HGB 15.3* 14.7 13.2  HCT 45.0 42.5 37.5  PLT  --  366 338    Recent Labs Lab 08/10/14 1428 08/11/14 0005 08/11/14 0228 08/11/14 0455  NA 130* 140 141 139  K 4.2 3.1* 3.5* 4.0  CL 92* 106 109 106  CO2 19 23 22 20   BUN 8 6 6 6   CREATININE 0.54 0.51 0.46* 0.46*  CALCIUM 9.6 7.8* 7.9* 8.1*  PROT 6.6  --   --   --   BILITOT 0.2*  --   --   --   ALKPHOS 206*  --   --   --   ALT 12  --   --   --   AST 11  --   --   --   GLUCOSE 632* 257* 110* 195*   Lab Results  Component Value Date   HGBA1C 16.2* 08/11/2014   Imaging/Diagnostic Tests: CXR: no edema or consolidation   Clinton Gallant, MD 08/11/2014, 11:33 AM IM PGY-3, covering [facility] [place] Intern pager: [phone], text pages welcome

## 2014-08-11 NOTE — Progress Notes (Signed)
Nutrition Brief Note  Patient identified on the Malnutrition Screening Tool (MST) Report.  Wt Readings from Last 15 Encounters:  08/10/14 246 lb 4.1 oz (111.7 kg)  08/10/14 295 lb (133.811 kg)  09/26/13 300 lb (136.079 kg)  08/22/13 300 lb (136.079 kg)  07/19/13 304 lb 5 oz (138.035 kg)  06/14/13 305 lb (138.347 kg)  04/17/13 303 lb (137.44 kg)  09/06/12 299 lb (135.626 kg)  06/10/12 305 lb (138.347 kg)  05/31/12 305 lb (138.347 kg)  01/04/12 295 lb (133.811 kg)  11/20/10 289 lb (131.09 kg)  08/05/10 294 lb (133.358 kg)  03/07/10 299 lb (135.626 kg)    Body mass index is 36.35 kg/(m^2). Patient meets criteria for Obesity Class II based on current BMI.   Current diet order is Carbohydrate Modified. Labs and medications reviewed.   No nutrition interventions warranted at this time. If nutrition issues arise, please consult RD.   Arthur Holms, RD, LDN Pager #: [phone] After-Hours Pager #: [phone]

## 2014-08-11 NOTE — Progress Notes (Signed)
Attending Note   I agree with the History/assesment & plan per Resident MD as scribed elsewhere within the note and have independently examined as well as discussed the Plan of care with the patient, and ammendments were made to above note reflecting my thoughts after careful review of Database, Progress notes, Imaging and Consultant notes  48 y/o ? Morbid obesity, Body mass index is 36.35 kg/(m^2)., elev BP without HTN diagnosis, .Polyarthritis  apparently tested "a long time ago" [30 yrs] in pregnancy for DM and was neg admitted 08/10/14 c  3-4 week h/o increased thirst/ urination/Weight loss ~ 30-60 pounds  Found to have HONK on admission and placed on insulin stabilizer  Currently no N/v/diarr/f/chills/no abd pain  Blood sugars coming down reasonably well AG closed Follow BMET for later today to ensure Gap still closed. She will probably need increase in dose of her Lantus to 18 U and  to mod or resistant scale coverage. Will need a lot of teaching and DM coordinator input much appreciated.   Verneita Griffes, MD

## 2014-08-12 LAB — BASIC METABOLIC PANEL
ANION GAP: 11 (ref 5–15)
BUN: 8 mg/dL (ref 6–23)
CALCIUM: 8.8 mg/dL (ref 8.4–10.5)
CO2: 24 mEq/L (ref 19–32)
CREATININE: 0.56 mg/dL (ref 0.50–1.10)
Chloride: 100 mEq/L (ref 96–112)
GFR calc Af Amer: >90 mL/min (ref >90)
GFR calc non Af Amer: >90 mL/min (ref >90)
Glucose, Bld: 369 mg/dL — ABNORMAL HIGH (ref 70–99)
Potassium: 4.1 mEq/L (ref 3.7–5.3)
Sodium: 135 mEq/L — ABNORMAL LOW (ref 137–147)

## 2014-08-12 LAB — GLUCOSE, CAPILLARY
GLUCOSE-CAPILLARY: 257 mg/dL — AB (ref 70–99)
GLUCOSE-CAPILLARY: 293 mg/dL — AB (ref 70–99)
Glucose-Capillary: 369 mg/dL — ABNORMAL HIGH (ref 70–99)
Glucose-Capillary: 292 mg/dL — ABNORMAL HIGH (ref 70–99)

## 2014-08-12 MED ORDER — FLUCONAZOLE 150 MG PO TABS
150.0000 mg | ORAL_TABLET | Freq: Once | ORAL | Status: AC
  Administered 2014-08-12: 150 mg via ORAL
  Filled 2014-08-12: qty 1

## 2014-08-12 MED ORDER — INSULIN GLARGINE 100 UNIT/ML ~~LOC~~ SOLN
25.0000 [IU] | Freq: Every day | SUBCUTANEOUS | Status: DC
  Filled 2014-08-12: qty 0.25

## 2014-08-12 MED ORDER — LIVING WELL WITH DIABETES BOOK
Freq: Once | Status: AC
  Administered 2014-08-12: 13:00:00
  Filled 2014-08-12: qty 1

## 2014-08-12 MED ORDER — INSULIN GLARGINE 100 UNIT/ML ~~LOC~~ SOLN
25.0000 [IU] | Freq: Every day | SUBCUTANEOUS | Status: DC
  Administered 2014-08-12 – 2014-08-13 (×2): 25 [IU] via SUBCUTANEOUS
  Filled 2014-08-12 (×2): qty 0.25

## 2014-08-12 NOTE — Discharge Summary (Signed)
Attending Note   I agree with the History/assesment & plan per Resident MD as scribed elsewhere within the note and have independently examined as well as discussed the Plan of care with the patient, and ammendments were made to above note reflecting my thoughts after careful review of Database, Progress notes, Imaging and Consultant notes   See note from today Still pending DM coordinator input Sugars 300's Increase lantus to 25 U daily consider Resistant SSI Likely home 1-2 days Need  A lot of education   Nita Sells

## 2014-08-12 NOTE — Progress Notes (Signed)
Family Medicine Teaching Service Daily Progress Note Intern Pager: [phone]  Patient name: Melanie Phillips Medical record number: [id number] Date of birth: 12-Apr-1966 Age: 48 y.o. Gender: female  Primary Care Provider: Diona Fanti, DO Consultants: none Code Status: full  Pt Overview and Major Events to Date:  08/10/14: HHS in setting of new diagnosis DM 08/11/14: HgbA1c 16.2 transitioned to lantus and SSI 9/13:continued to have elevated CBGs in 300s  Assessment and Plan: # HHS in setting of new diagnosis DM: pt presented with overall fatigue and classic symptoms of polyuria and polydipsia along with decreased visual acuity. CBG 600s, AG 19, UA - ketones, and bicarb 20. Pt has sister and mother with DM. Continued CBGs overnight in the 300s.  -increased lantus to 25 units daily -may need to increase to resistant SSI scale given weight and significant hyperglycemia  -DM educator [facility] NEEDED education -started metformin 500mg  BID and then increase at discharge and outpatient   # Tobacco abuse: nicotine patch, counseled on cessation pt at precontemplation  FEN/GI: carb mod diet PPx: heparin   Disposition: home once stable w/in 1-2 days  Subjective:  NAEON. Pt doing well this AM with lots of questions for DM management and CBG checking. No chest pain, blurry vision, paraesthesias, dysuria, or dry mouth.   Objective: Temp:  [97.6 F (36.4 C)-98.2 F (36.8 C)] 97.7 F (36.5 C) (09/13 0927) Pulse Rate:  [60-77] 72 (09/13 0927) Resp:  [16-18] 18 (09/13 0927) BP: (112-144)/(71-86) 128/71 mmHg (09/13 0927) SpO2:  [98 %-100 %] 99 % (09/13 0927) Physical Exam: General: resting in bed, NAD HEENT: PERRL, EOMI, no scleral icterus Cardiac: RRR, no rubs, murmurs or gallops Pulm: clear to auscultation bilaterally, no crackles, wheezes/rhonchi, moving normal volumes of air Abd: soft, obese, nontender, nondistended, BS present Ext: warm and well perfused, no pedal edema Neuro: alert and  oriented X3, cranial nerves II-XII grossly intact   Laboratory:  Recent Labs Lab 08/10/14 1212 08/10/14 1428 08/11/14 0005  WBC  --  6.5 6.9  HGB 15.3* 14.7 13.2  HCT 45.0 42.5 37.5  PLT  --  366 338    Recent Labs Lab 08/10/14 1428  08/11/14 0455 08/11/14 1210 08/12/14 0145  NA 130*  < > 139 134* 135*  K 4.2  < > 4.0 4.3 4.1  CL 92*  < > 106 103 100  CO2 19  < > 20 20 24   BUN 8  < > 6 7 8   CREATININE 0.54  < > 0.46* 0.49* 0.56  CALCIUM 9.6  < > 8.1* 8.7 8.8  PROT 6.6  --   --   --   --   BILITOT 0.2*  --   --   --   --   ALKPHOS 206*  --   --   --   --   ALT 12  --   --   --   --   AST 11  --   --   --   --   GLUCOSE 632*  < > 195* 351* 369*  < > = values in this interval not displayed. Lab Results  Component Value Date   HGBA1C 16.2* 08/11/2014   Imaging/Diagnostic Tests: CXR: no edema or consolidation   Clinton Gallant, MD 08/12/2014, 9:47 AM IM PGY-3, covering [facility] [place] Intern pager: [phone], text pages welcome

## 2014-08-12 NOTE — Progress Notes (Signed)
Pt currently reading information on DM printed out by night RN from [facility] d/t DM videos not working properly.  Will continue to monitor and educate pt. Syliva Overman

## 2014-08-12 NOTE — Progress Notes (Signed)
Inpatient Diabetes Program Recommendations  AACE/ADA: New Consensus Statement on Inpatient Glycemic Control 02-23-2012)  Target Ranges:  Prepandial:   less than 140 mg/dL      Peak postprandial:   less than 180 mg/dL (1-2 hours)      Critically ill patients:  140 - 180 mg/dL   Results for Melanie Phillips, Melanie Phillips (MRN [id number]) as of 08/12/2014 16:16  Ref. Range 08/11/2014 00:05  Hemoglobin A1C Latest Range: <5.7 % 16.2 (H)   Results for Melanie Phillips, Melanie Phillips (MRN [id number]) as of 08/12/2014 16:16  Ref. Range 08/11/2014 06:04 08/11/2014 07:33 08/11/2014 11:33 08/11/2014 17:23 08/11/2014 21:53 08/12/2014 08:06 08/12/2014 12:22  Glucose-Capillary Latest Range: 70-99 mg/dL 209 (H) 251 (H) 386 (H) 342 (H) 312 (H) 369 (H) 257 (H)   Diabetes history: No Outpatient Diabetes medications: NA Current orders for Inpatient glycemic control: Lantus 25 units daily, Novolog 0-15 units AC, Novolog 0-5 units HS, Metformin 500 mg BID  Inpatient Diabetes Program Recommendations Insulin - Basal: Please increase Lantus to 33 units daily (based on 111kg x 0.3 units). Correction (SSI): Please consider increasing Novolog correction to Resistant correction scale. Insulin - Meal Coverage: Please consider ordering Novolog 5 units TID with meals for meal coverage (in addition to Novolog correction).  Note: Spoke with patient over the phone about new diabetes diagnosis. Patient reports that her mother had diabetes and used insulin (deceased in 02-23-2003) and her sister has diabetes and is on oral diabetes medications. According to the patient she was recently approved for the orange card about 2 weeks ago and was connected with the [facility] who has helped her set up appointments with [facility] (set for August 15, 2014 @ 2:00pm) and with an eye doctor. Patient reports that she went to an Urgent Care on 08/10/14 and her glucose was over 600 mg/dl so she was instructed to go to the [facility]  who then advised her to come to the ED at [facility].   Had ordered Living Well with Diabetes booklet for the patient and she states that she had received the book and had began reading over it already. Discussed A1C results (16.2% on 08/11/14) and explained what an A1C is, basic pathophysiology of DM Type 2, basic home care, importance of checking CBGs and maintaining good CBG control to prevent long-term and short-term complications. Reviewed signs and symptoms of hyperglycemia and hypoglycemia along with treatment for both. 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  Discussed impact of nutrition, exercise, stress, sickness, and medications on diabetes control. Discussed carbohydrates, carbohydrate goals per day and meal, along with portion sizes. Patient states that she is suppose to call [facility] back in the morning so they can set up to have a dietician come out to her house for more education on diet.  Discussed insulin and current medications being used for inpatient glycemic control. 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 Discussed insulin injections via vial and syringe versus insulin pens. Diabetes Coordinator will plan to visit with patient in the morning to show her insulin pens and see which she prefers. Have asked that patient go to the [facility] after discharged with prescriptions so she can get a glucometer, testing supplies, and insulin.  Patient verbalized understanding of information discussed  and she states that she has no further questions at this time related to diabetes.  RNs to provide ongoing basic DM education at bedside with this patient and engage patient to actively check blood glucose and administer insulin injections. Have ordered educational booklet, insulin starter kit, RD  consult, and DM videos.  MD- At time of discharge, please provide patient with prescription for glucometer and testing supplies. Patient will also need prescriptions for insulins along with syringes or needle tips (depending on which method the patient prefers).   Thanks, Barnie Alderman, RN, MSN, CCRN Diabetes Coordinator Inpatient Diabetes Program [phone] (Team Pager) [phone] (AP office) [phone] [facility] office)

## 2014-08-12 NOTE — Progress Notes (Signed)
See dictated note elsewhere in chart please

## 2014-08-13 DIAGNOSIS — E1101 Type 2 diabetes mellitus with hyperosmolarity with coma: Principal | ICD-10-CM

## 2014-08-13 LAB — BASIC METABOLIC PANEL
ANION GAP: 10 (ref 5–15)
BUN: 10 mg/dL (ref 6–23)
CHLORIDE: 100 meq/L (ref 96–112)
CO2: 28 meq/L (ref 19–32)
CREATININE: 0.64 mg/dL (ref 0.50–1.10)
Calcium: 9.1 mg/dL (ref 8.4–10.5)
GFR calc non Af Amer: >90 mL/min (ref >90)
Glucose, Bld: 288 mg/dL — ABNORMAL HIGH (ref 70–99)
POTASSIUM: 3.9 meq/L (ref 3.7–5.3)
SODIUM: 138 meq/L (ref 137–147)

## 2014-08-13 LAB — GLUCOSE, CAPILLARY
Glucose-Capillary: 376 mg/dL — ABNORMAL HIGH (ref 70–99)
Glucose-Capillary: 326 mg/dL — ABNORMAL HIGH (ref 70–99)
Glucose-Capillary: 338 mg/dL — ABNORMAL HIGH (ref 70–99)

## 2014-08-13 MED ORDER — INFLUENZA VAC SPLIT QUAD 0.5 ML IM SUSY: 0.5000 mL | PREFILLED_SYRINGE | INTRAMUSCULAR | Status: DC

## 2014-08-13 MED ORDER — "INSULIN SYRINGE 31G X 5/16"" 1 ML MISC": Status: DC

## 2014-08-13 MED ORDER — INFLUENZA VAC SPLIT QUAD 0.5 ML IM SUSY
0.5000 mL | PREFILLED_SYRINGE | Freq: Once | INTRAMUSCULAR | Status: AC
  Administered 2014-08-13: 0.5 mL via INTRAMUSCULAR
  Filled 2014-08-13: qty 0.5

## 2014-08-13 MED ORDER — ATORVASTATIN CALCIUM 40 MG PO TABS: 40.0000 mg | ORAL_TABLET | Freq: Every day | ORAL | Status: DC

## 2014-08-13 MED ORDER — ACCU-CHEK NANO SMARTVIEW W/DEVICE KIT: 1.0000 | PACK | Freq: Three times a day (TID) | Status: DC

## 2014-08-13 MED ORDER — GLUCOSE BLOOD VI STRP: ORAL_STRIP | Status: DC

## 2014-08-13 MED ORDER — ACCU-CHEK FASTCLIX LANCETS MISC: Status: DC

## 2014-08-13 MED ORDER — METFORMIN HCL 500 MG PO TABS: 500.0000 mg | ORAL_TABLET | Freq: Two times a day (BID) | ORAL | Status: DC

## 2014-08-13 MED ORDER — PNEUMOCOCCAL VAC POLYVALENT 25 MCG/0.5ML IJ INJ
0.5000 mL | INJECTION | Freq: Once | INTRAMUSCULAR | Status: AC
  Administered 2014-08-13: 0.5 mL via INTRAMUSCULAR

## 2014-08-13 MED ORDER — INSULIN ASPART 100 UNIT/ML ~~LOC~~ SOLN: 10.0000 [IU] | Freq: Three times a day (TID) | SUBCUTANEOUS | Status: DC

## 2014-08-13 MED ORDER — INSULIN GLARGINE 100 UNIT/ML ~~LOC~~ SOLN: 30.0000 [IU] | Freq: Every day | SUBCUTANEOUS | Status: DC

## 2014-08-13 MED ORDER — PNEUMOCOCCAL VAC POLYVALENT 25 MCG/0.5ML IJ INJ: 0.5000 mL | INJECTION | INTRAMUSCULAR | Status: DC

## 2014-08-13 MED ORDER — INSULIN STARTER KIT- PEN NEEDLES (ENGLISH)
1.0000 | Freq: Once | Status: AC
  Administered 2014-08-13: 1
  Filled 2014-08-13: qty 1

## 2014-08-13 MED ORDER — INSULIN GLARGINE 100 UNIT/ML ~~LOC~~ SOLN
30.0000 [IU] | Freq: Every day | SUBCUTANEOUS | Status: DC
Start: 2014-08-13 — End: 2014-08-13

## 2014-08-13 NOTE — Progress Notes (Addendum)
Inpatient Diabetes Program Recommendations  AACE/ADA: New Consensus Statement on Inpatient Glycemic Control (2013)  Target Ranges:  Prepandial:   less than 140 mg/dL      Peak postprandial:   less than 180 mg/dL (1-2 hours)      Critically ill patients:  140 - 180 mg/dL     Results for Melanie Phillips, Melanie Phillips (MRN [id number]) as of 08/13/2014 09:51  Ref. Range 08/12/2014 08:06 08/12/2014 12:22 08/12/2014 17:24 08/12/2014 21:41  Glucose-Capillary Latest Range: 70-99 mg/dL 369 (H) 257 (H) 293 (H) 292 (H)    Results for Melanie Phillips, Melanie Phillips (MRN [id number]) as of 08/13/2014 09:51  Ref. Range 08/11/2014 00:05  Hemoglobin A1C Latest Range: <5.7 % 16.2 (H)     Spoke with patient this AM about her new diagnosis.  Discussed A1C results with her and explained what an A1C is, basic home care, importance of checking CBGs and maintaining good CBG control to prevent long-term and short-term complications.  Reviewed blood sugar goals and encouraged patient to check her CBGs at least tid before meals at home to start and to keep a record of these CBGs and take that record to all MD appointments.   RNs to provide ongoing basic DM education at bedside with this patient.    Patient has already begin reading DM educational booklet.  Had several questions about her nutrition plan for home.  Reviewed basic carbohydrate counting with patient and encouraged patient to limit carbohydrate intake to 45-60 grams of carbs per meal per day, 1-2 snacks per day (15 grams of carbs or less).  RD to visit with patient today before discharge.  Discussed using insulin at home with patient.  Discussed two different way to give insulin.  Educated patient on insulin pen use at home.  Reviewed all steps if insulin pen including attachment of needle, 2-unit air shot, dialing up dose, giving injection, removing needle, disposal of sharps, storage of unused insulin, disposal of insulin etc.  Patient able to provide successful return demonstration.   Also reviewed troubleshooting with insulin pen.  MD to give patient Rxs for insulin pens and insulin pen needles at time of discharge.  RN to review insulin administration using vial and syringe with patient so that patient knows how to give insulin safely both ways.  RN also to allow patient to practice checking fingerstick glucose levels before discharge as well.  Instructed patient to take her [facility]xs to [facility] the [facility] or the [facility] to get these filled after d/c.  Patient stated she understood.  Reminded patient to take her orange card with her to the pharmacy.   MD- Please consider the following insulin adjustments:  1. Please increase Lantus to 33 units daily (based on 111kg x 0.3 units) 2. Please consider ordering Novolog 5 units TID with meals for meal coverage (in addition to Novolog correction).    MD- Please make sure to give patient the following Rxs at time of discharge. She will go [facility] to the [facility] or [facility] to get her Rxs filled with her orange card: Lantus Solostar insulin pen [Order # [id number]] Novolog Flexpen insulin pen [Order # [id number] Insulin pen needles- 31 gauge x 35mm [Order # [id number]] CBG meter and supplies [Order # [id number]]       Will follow [name] RN, MSN, CDE Diabetes Coordinator Inpatient Diabetes Program Team Pager: [phone] (8a-10p)

## 2014-08-13 NOTE — Care Management Note (Signed)
  Page 1 of 1   08/13/2014     9:43:30 AM CARE MANAGEMENT NOTE 08/13/2014  Patient:  Satterfield,Ashe L   Account Number:  [id number]  Date Initiated:  08/13/2014  Documentation initiated by:  Magdalen Spatz  Subjective/Objective Assessment:     Action/Plan:   Anticipated DC Date:  08/13/2014   Anticipated DC Plan:           Choice offered to / List presented to:             Status of service:   Medicare Important Message given?   (If response is "NO", the following Medicare IM given date fields will be blank) Date Medicare IM given:   Medicare IM given by:   Date Additional Medicare IM given:   Additional Medicare IM given by:    Discharge Disposition:    Per UR Regulation:    If discussed at Long Length of Stay Meetings, dates discussed:    Comments:  08-13-14 Referral for medication assistance . Confirmed with patient that she has an orange card. She can take her prescriptions and [facility] to [facility] or [place] to have prescriptions . Patient knows where [facility] is located and has information regarding [facility]. Already has follow up appointment with PCP at [facility] .  Magdalen Spatz RN BSN [phone]

## 2014-08-13 NOTE — Discharge Summary (Signed)
[facility] Discharge Summary  Patient name: Melanie Phillips Medical record number: [id number] Date of birth: 1966/02/14 Age: 48 y.o. Gender: female Date of Admission: 08/10/2014  Date of Discharge: 08/13/14 Admitting Physician: Alveda Reasons, MD  Primary Care Provider: Diona Fanti, DO Consultants: None  Indication for Hospitalization: HHS with New Onset Diabetes  Discharge Diagnoses/Problem List:  Type 2 Diabetes HHS Tobacco Abuse  Disposition: Discharge home.  Discharge Condition: Stable  Brief Hospital Course:  Melanie Phillips is a 48yo female with history of tobacco abuse and HTN who presented to the ED with decreased weight, polydipsia, and polyuria.  Initial blood glucose was 581 with anion gap of 19.  No ketones were found in urine.  He was treated with glucomander, IV fluids, and KCl. Once the anion gap was closed Melanie Phillips was transitioned to Lantus, moderate ISS, and Metformin.  Diabetes education was offered throughout hospitalization. C peptide level was 1.11. Hemoglobin A1C of 16.2 Lipid Panel showed Cholesterol 195, TG 162, HDL 38, and LDL 125.  10 Year ASCVD Risk was17.9%.  Melanie Phillips was discharged on 9/14 following improvement of her anion gap, tolerance of medication, and diabetes education.   Issues for Follow Up:  - ASCVD Risk of 17.9%.  Discharged with prescription for atorvastatin 57m.  -Monitor BG and adjust diabetic regimen as appropriate.  Discharged on Lantus 30Units, Novolog 10Units at meals, and Metformin 5076mBID.  Significant Procedures: None  Significant Labs and Imaging:   Recent Labs Lab 08/10/14 1212 08/10/14 1428 08/11/14 0005  WBC  --  6.5 6.9  HGB 15.3* 14.7 13.2  HCT 45.0 42.5 37.5  PLT  --  366 338    Recent Labs Lab 08/10/14 1428  08/11/14 0228 08/11/14 0455 08/11/14 1210 08/12/14 0145 08/13/14 0155  NA 130*  < > 141 139 134* 135* 138  K 4.2  < > 3.5* 4.0 4.3 4.1 3.9  CL 92*  < > 109  106 103 100 100  CO2 19  < > _0 GLUCOSE 632*  < > 110* 195* 351* 369* 288*  BUN 8  < > _1 CREATININE 0.54  < > 0.46* 0.46* 0.49* 0.56 0.64  CALCIUM 9.6  < > 7.9* 8.1* 8.7 8.8 9.1  ALKPHOS 206*  --   --   --   --   --   --   AST 11  --   --   --   --   --   --   ALT 12  --   --   --   --   --   --   ALBUMIN 3.2*  --   --   --   --   --   --   < > = values in this interval not displayed. Lipid Panel     Component Value Date/Time   CHOL 195 08/11/2014 0005   TRIG 162* 08/11/2014 0005   HDL 38* 08/11/2014 0005   CHOLHDL 5.1 08/11/2014 0005   VLDL 32 08/11/2014 0005   LDLCALC 125* 08/11/2014 0005   C peptide- 1.11 Hemoglobin A1C- 16.2  TSH- 3.6  Results/Tests Pending at Time of Discharge: None  Discharge Medications:    Medication List    STOP taking these medications       naproxen sodium 220 MG tablet  Commonly known as:  ANAPROX      TAKE these medications  ACCU-CHEK FASTCLIX LANCETS Misc  Use as needed to check blood sugar.     ACCU-CHEK NANO SMARTVIEW W/DEVICE Kit  1 kit by Does not apply route 4 (four) times daily -  before meals and at bedtime.     atorvastatin 40 MG tablet  Commonly known as:  LIPITOR  Take 1 tablet (40 mg total) by mouth daily.     glucose blood test strip  Commonly known as:  ACCU-CHEK SMARTVIEW  Use as instructed     ibuprofen 200 MG tablet  Commonly known as:  ADVIL,MOTRIN  Take 600 mg by mouth every 6 (six) hours as needed for moderate pain.     insulin aspart 100 UNIT/ML injection  Commonly known as:  novoLOG  Inject 10 Units into the skin 3 (three) times daily with meals.     insulin glargine 100 UNIT/ML injection  Commonly known as:  LANTUS  Inject 0.3 mLs (30 Units total) into the skin daily.     INSULIN SYRINGE 1CC/31GX5/16" 31G X 5/16" 1 ML Misc  Use as indicated to administer insulin.     metFORMIN 500 MG tablet  Commonly known as:  GLUCOPHAGE  Take 1 tablet (500 mg total) by mouth 2 (two) times  daily with a meal.        Discharge Instructions: Please refer to Patient Instructions section of EMR for full details.  Patient was counseled important signs and symptoms that should prompt return to medical care, changes in medications, dietary instructions, activity restrictions, and follow up appointments.   Follow-Up Appointments: Follow-up Information   Follow up with Diona Fanti, DO On 08/15/2014. (2PM)    Specialty:  Family Medicine   Contact information:   [address] [address] [place], DO 08/13/2014, 6:16 PM PGY-1, [place]

## 2014-08-13 NOTE — Progress Notes (Signed)
FMTS ATTENDING  NOTE Melanie Weich,MD I  have seen and examined this patient, reviewed their chart. I have discussed this patient with the resident. I agree with the resident's findings, assessment and care plan. 

## 2014-08-13 NOTE — Progress Notes (Signed)
Discharge instructions reviewed with pt. Rx's given. Pt reports that she doesn't have any questions at this time. Pt shared that her sister has diabetes and is going to help her with some of the routine of things when she gets home. Pt verbalized understanding of instructions. Pt ready for discharge when ride arrives.

## 2014-08-13 NOTE — H&P (Signed)
FMTS Attending Admission Note: Annabell Sabal MD Personal pager:  [phone] FPTS Service Pager:  [phone]  I  have seen and examined this patient, reviewed their chart. I have discussed this patient with the resident. I agree with the resident's findings, assessment and care plan.   Alveda Reasons, MD

## 2014-08-13 NOTE — Plan of Care (Cosign Needed)
Problem: Food- and Nutrition-Related Knowledge Deficit (NB-1.1) Goal: Nutrition education Formal process to instruct or train a patient/client in a skill or to impart knowledge to help patients/clients voluntarily manage or modify food choices and eating behavior to maintain or improve health. Outcome: Completed/Met Date Met:  08/13/14  RD consulted for nutrition education regarding diabetes.     Lab Results  Component Value Date    HGBA1C 16.2* 08/11/2014    RD provided "Carbohydrate Counting for People with Diabetes" and "Using Nutrition Labels: Carbohydrates"  handout from the Academy of Nutrition and Dietetics. Discussed different food groups and their effects on blood sugar, emphasizing carbohydrate-containing foods. Provided list of carbohydrates and recommended serving sizes of common foods.  Pt had received brief introduction to carbohydrate counting from DM coordinator. Reviewed carbohydrate counting, label reading, and MyPlate with patient. 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 Patient is reading the diabetes management booklet that was given to her and had many questions, answered her questions that she had.  Discussed importance of controlled and consistent carbohydrate intake throughout the day. Provided examples of ways to balance meals/snacks and encouraged intake of high-fiber, whole grain complex carbohydrates. Teach back method used.  Expect good compliance.  Body mass index is 36.35 kg/(m^2). Pt meets criteria for obesity based on current BMI.  Current diet order is carb modified, patient is consuming approximately 100% of meals at this time. Labs and medications reviewed. No further nutrition interventions warranted at this time. RD contact information provided. If additional nutrition issues arise, please re-consult RD.  Melanie Phillips, [place], [place] Licensed Dietitian Nutritionist Pager: [phone]

## 2014-08-13 NOTE — Progress Notes (Signed)
Family Medicine Teaching Service Daily Progress Note Intern Pager: [phone]  Patient name: Melanie Phillips Medical record number: [id number] Date of birth: 31-Aug-1966 Age: 48 y.o. Gender: female  Primary Care Provider: Diona Fanti, DO Consultants: none Code Status: full  Assessment and Plan: # HHS in setting of new diagnosis DM: Presented with overall fatigue, polyuria, and polydipsia along with decreased visual acuity.  -BG x24 hr: 369, 257, 293, 292, 288, 326 -AG trended down to 10 -A1C- 16.2 -C peptide- 1.11 -TSH- 3.6 -Lipid Panel: Cholesterol 195, TG 162, HDL 38, LDL 125 -10 Year ASCVD Risk- 17.9%  -Consider High Intensity Statins -DM education -Lantus 25 units, Moderate ISS -Metformin 500mg  BID  -Case Management consult for medication needs  # Tobacco abuse:  -Nicotine patch  FEN/GI: carb mod diet PPx: heparin   Disposition: Admitted to [facility] Medicine Teaching Service. Anticipated discharge home today following Case Management consult and DM education.  Subjective:  No acute complaints overnight.  Feels comfortable with diabetes education. Denies problems with urination or bowel movements.  No changes in appetite.  No further complaints today.   Objective: Temp:  [97.3 F (36.3 C)-98 F (36.7 C)] 97.3 F (36.3 C) (09/14 0600) Pulse Rate:  [57-72] 68 (09/14 0600) Resp:  [17-18] 18 (09/14 0600) BP: (126-143)/(63-91) 139/90 mmHg (09/14 0600) SpO2:  [97 %-100 %] 100 % (09/14 0600) Physical Exam: Gen:  48yo female resting comfortably in no apparent distress Cardio:  S1 and S2 noted. No murmurs noted.  Regular rate and rhythm Resp:  No increased work of breathing.  Clear to auscultation bilaterally.  No wheezing noted. Abd:  Bowel sounds noted.  Soft and nondistended. Nontender. Ext: No edema noted.  Laboratory:  Recent Labs Lab 08/10/14 1212 08/10/14 1428 08/11/14 0005  WBC  --  6.5 6.9  HGB 15.3* 14.7 13.2  HCT 45.0 42.5 37.5  PLT  --  366 338     Recent Labs Lab 08/10/14 1428  08/11/14 1210 08/12/14 0145 08/13/14 0155  NA 130*  < > 134* 135* 138  K 4.2  < > 4.3 4.1 3.9  CL 92*  < > 103 100 100  CO2 19  < > 20 24 28   BUN 8  < > 7 8 10   CREATININE 0.54  < > 0.49* 0.56 0.64  CALCIUM 9.6  < > 8.7 8.8 9.1  PROT 6.6  --   --   --   --   BILITOT 0.2*  --   --   --   --   ALKPHOS 206*  --   --   --   --   ALT 12  --   --   --   --   AST 11  --   --   --   --   GLUCOSE 632*  < > 351* 369* 288*  < > = values in this interval not displayed. Lab Results  Component Value Date   HGBA1C 16.2* 08/11/2014   -A1C- 16.2 -C peptide- 1.11 -TSH- 3.6 -Lipid Panel: Cholesterol 195, TG 162, HDL 38, LDL 125  Imaging/Diagnostic Tests: CXR: no edema or consolidation  Lorna Few, DO PGY-1 08/13/2014, 8:19 AM FPTS Intern pager: [phone], text pages welcome

## 2014-08-13 NOTE — Discharge Instructions (Signed)
Please take your medications as prescribed. Follow up with your PCP as indicated. Please pick up your medications as [facility].   Melanie Phillips, [place] [id number] Diabetes Mellitus and Food It is important for you to manage your blood sugar (glucose) level. Your blood glucose level can be greatly affected by what you eat. Eating healthier foods in the appropriate amounts throughout the day at about the same time each day will help you control your blood glucose level. It can also help slow or prevent worsening of your diabetes mellitus. Healthy eating may even help you improve the level of your blood pressure and reach or maintain a healthy weight.  HOW CAN FOOD AFFECT ME? Carbohydrates Carbohydrates affect your blood glucose level more than any other type of food. Your dietitian will help you determine how many carbohydrates to eat at each meal and teach you how to count carbohydrates. Counting carbohydrates is important to keep your blood glucose at a healthy level, especially if you are using insulin or taking certain medicines for diabetes mellitus. Alcohol Alcohol can cause sudden decreases in blood glucose (hypoglycemia), especially if you use insulin or take certain medicines for diabetes mellitus. Hypoglycemia can be a life-threatening condition. Symptoms of hypoglycemia (sleepiness, dizziness, and disorientation) are similar to symptoms of having too much alcohol.  If your health care provider has given you approval to drink alcohol, do so in moderation and use the following guidelines:  Women should not have more than one drink per day, and men should not have more than two drinks per day. One drink is equal to:  12 oz of beer.  5 oz of wine.  1 oz of hard liquor.  Do not drink on an empty stomach.  Keep yourself hydrated. Have water, diet soda, or unsweetened iced tea.  Regular soda, juice, and  other mixers might contain a lot of carbohydrates and should be counted. WHAT FOODS ARE NOT RECOMMENDED? As you make food choices, it is important to remember that all foods are not the same. Some foods have fewer nutrients per serving than other foods, even though they might have the same number of calories or carbohydrates. It is difficult to get your body what it needs when you eat foods with fewer nutrients. Examples of foods that you should avoid that are high in calories and carbohydrates but low in nutrients include:  Trans fats (most processed foods list trans fats on the Nutrition Facts label).  Regular soda.  Juice.  Candy.  Sweets, such as cake, pie, doughnuts, and cookies.  Fried foods. WHAT FOODS CAN I EAT? Have nutrient-rich foods, which will nourish your body and keep you healthy. The food you should eat also will depend on several factors, including:  The calories you need.  The medicines you take.  Your weight.  Your blood glucose level.  Your blood pressure level.  Your cholesterol level. You also should eat a variety of foods, including:  Protein, such as meat, poultry, fish, tofu, nuts, and seeds (lean animal proteins are best).  Fruits.  Vegetables.  Dairy products, such as milk, cheese, and yogurt (low fat is best).  Breads, grains, pasta, cereal, rice, and beans.  Fats such as olive oil, trans fat-free margarine, canola oil, avocado, and olives. DOES EVERYONE WITH DIABETES MELLITUS HAVE THE SAME MEAL PLAN? Because every person with diabetes mellitus is different, there is not one meal plan that works for everyone. It  is very important that you meet with a dietitian who will help you create a meal plan that is just right for you. Document Released: 08/13/2005 Document Revised: 11/21/2013 Document Reviewed: 10/13/2013 [facility] Patient Information 2015 [place], [place]. This information is not intended to replace advice given to you by your health care  provider. Make sure you discuss any questions you have with your health care provider.

## 2014-08-14 NOTE — Discharge Summary (Signed)
FMTS ATTENDING  NOTE Dahna Hattabaugh,MD I  have seen and examined this patient, reviewed their chart. I have discussed this patient with the resident. I agree with the resident's findings, assessment and care plan. 

## 2014-08-15 ENCOUNTER — Ambulatory Visit (INDEPENDENT_AMBULATORY_CARE_PROVIDER_SITE_OTHER): Payer: PRIVATE HEALTH INSURANCE | Admitting: Obstetrics and Gynecology

## 2014-08-15 ENCOUNTER — Encounter: Payer: Self-pay | Admitting: Obstetrics and Gynecology

## 2014-08-15 VITALS — BP 126/86 | HR 68 | Temp 97.7°F | Wt 244.0 lb

## 2014-08-15 DIAGNOSIS — E119 Type 2 diabetes mellitus without complications: Secondary | ICD-10-CM | POA: Insufficient documentation

## 2014-08-15 DIAGNOSIS — F17200 Nicotine dependence, unspecified, uncomplicated: Secondary | ICD-10-CM

## 2014-08-15 DIAGNOSIS — Z9189 Other specified personal risk factors, not elsewhere classified: Secondary | ICD-10-CM | POA: Insufficient documentation

## 2014-08-15 DIAGNOSIS — K0889 Other specified disorders of teeth and supporting structures: Secondary | ICD-10-CM

## 2014-08-15 LAB — BETA-HYDROXYBUTYRIC ACID: Beta-Hydroxybutyric Acid: 0.07 mmol/L

## 2014-08-15 MED ORDER — NICOTINE 7 MG/24HR TD PT24: 7.0000 mg | MEDICATED_PATCH | Freq: Every day | TRANSDERMAL | Status: DC

## 2014-08-15 MED ORDER — LISINOPRIL 5 MG PO TABS: 2.5000 mg | ORAL_TABLET | Freq: Every day | ORAL | Status: DC

## 2014-08-15 MED ORDER — ASPIRIN EC 81 MG PO TBEC: 81.0000 mg | DELAYED_RELEASE_TABLET | Freq: Every day | ORAL | Status: AC

## 2014-08-15 NOTE — Assessment & Plan Note (Signed)
Currently smoking 2-3 cigarettes a day. Discussed with patient need for cessation. Patch ordered.

## 2014-08-15 NOTE — Assessment & Plan Note (Addendum)
A: Glucose range 220s to 140s. No associated symptoms. P: Continue current insulin regimen. Did not feel like adjustment is needed at this time since patient will be exercising and eating healthier. Added aspirin and lisinopril to regimen. Diabetic foot exam performed today. Patient has eye appointment scheduled for October 8. Urine microalbumin collected today. Patient received extensive diabetes education today and has outpatient follow-up. Patient will followup in one month.

## 2014-08-15 NOTE — Assessment & Plan Note (Signed)
Patient does not see a dentist. Dental decay and some disease noted. Referral to dentist made.

## 2014-08-15 NOTE — Progress Notes (Signed)
Patient ID: Melanie Phillips, female   DOB: 05-03-1966, 48 y.o.   MRN: [id number]     Subjective:  Chief Complaint  Patient presents with  . Hospitalization Follow-up  . Diabetes  . Medication Refill    HPI: Melanie Phillips is a 48 y.o. presenting to clinic today for hospital follow-up on her diabetes.  Diabetes Mellitus Type 2:  Was discharged from hospital 2 days ago. Patient states she feels much better. Diabetes education was given a hospital before discharge. Patient now exercising and eating better. She states that an RN will be coming to her home to further educate her on diabetes.  Sugars - High at home: 226    Low at home: 148 Taking medications: Lantus 30 units at bedtime NovoLog 10 units 3 times a day. Metformin 500 mg twice a day. Side effects: No reported side effects. Denies symptoms of hypoglycemia. ROS: denies fever, chills, dizziness, LOC, polyuria, polydipsia, numbness or tingling in extremities or chest pain.  Last eye exam: Scheduled for Oct. 8th at 1:30pm Visual distrubances: no  Family History of diabetes: Mom, uncles 2(moms side), 1 sister  Social History: Currently smoking 2-3 cigarettes a day. She said that the patch worked for her in the hospital and that she would like to quit.  Health Maintenance: Needs Diabetic foot exam, opthalmology exam, urine microalbumin.   All systems were reviewed and were negative unless otherwise noted in the HPI  Objective: BP 126/86  Pulse 68  Temp(Src) 97.7 F (36.5 C) (Oral)  Wt 244 lb (110.678 kg)  General: alert, well-developed, NAD, cooperative, A&Ox3 HEENT: NCAT. vision grossly intact, PERRLA, no injection and anicteric. MMM, no erythema, no exudates, or lesions.  Poor dentition. Neck: supple, full ROM, no thyromegaly, no JVD Lungs: CTAB, normal respiratory effort, no accessory muscle use, no crackles, and no wheezes.4 Heart: RRR, no M/R/G.  Abdomen: soft, NT, ND, BS +, no guarding, no rebound tenderness, no  hepatosplenomegaly, no palpable masses.  Pulses: 2+ DP/PT pulses bilaterally Extremities: No cyanosis, clubbing, edema  Neurologic:   No neurologic deficits. Skin: turgor normal and no rashes.   Diabetic Foot Check Appearance - no lesions, ulcers or calluses. Thickened toenails.  Skin - no unusual pallor or redness. Dry. Monofilament testing: Right - Great toe, medial, central, lateral ball and posterior foot intact Left - Great toe, medial, central, lateral ball and posterior foot intact  Assessment/Plan: Please see problem based Assessment and Plan  Health Maintainance: Diabetic foot exam performed today. Patient has eye appointment scheduled for October 8. Urine microalbumin collected today.  Luiz Blare, DO 08/15/2014, 2:59 PM PGY-1, [place]

## 2014-08-15 NOTE — Patient Instructions (Addendum)
Ms. Faulks it was great to see you today!   I am pleased to hear that things are better for you. Keep up the good work with documentation, eating right, and exercise.  Here are some of the things we discussed today: -Need to exercise at least 5x/week for at least 30 minutes. This can include just walking.  -Smoking cessation --> I will be giving you a prescription for a patch -Continue to monitor your sugars at home. This is very important until we find the right regimen for you.  -I am not changing your regimen at this time. I do not one to drop you too low since you were so high before. -Diet Recommendations for Diabetes   Starchy (carb) foods include: Bread, rice, pasta, potatoes, corn, crackers, bagels, muffins, all baked goods.   Protein foods include: Meat, fish, poultry, eggs, dairy foods, and beans such as pinto and kidney beans (beans also provide carbohydrate).   1. Eat at least 3 meals and 1-2 snacks per day. Never go more than 4-5 hours while awake without eating.  2. Limit starchy foods to TWO per meal and ONE per snack. ONE portion of a starchy  food is equal to the following:   - ONE slice of bread (or its equivalent, such as half of a hamburger bun).   - 1/2 cup of a "scoopable" starchy food such as potatoes or rice.   - 1 OUNCE (28 grams) of starchy snack foods such as crackers or pretzels (look on label).   - 15 grams of carbohydrate as shown on food label.  3. Both lunch and dinner should include a protein food, a carb food, and vegetables.   - Obtain twice as many veg's as protein or carbohydrate foods for both lunch and dinner.   - Try to keep frozen veg's on hand for a quick vegetable serving.     - Fresh or frozen veg's are best.  4. Breakfast should always include protein.    New medications: -Aspirin 81mg  daily -Lisiniopril 2.5mg  (1/2 a tablet) daily. This medication is to protect your kidneys.   Please schedule a follow-up appointment for 1 month.   Thanks  for allowing me to be a part of your care! Dr. Gerarda Fraction       Type 2 Diabetes Mellitus Type 2 diabetes mellitus, often simply referred to as type 2 diabetes, is a long-lasting (chronic) disease. In type 2 diabetes, the pancreas does not make enough insulin (a hormone), the cells are less responsive to the insulin that is made (insulin resistance), or both. Normally, insulin moves sugars from food into the tissue cells. The tissue cells use the sugars for energy. The lack of insulin or the lack of normal response to insulin causes excess sugars to build up in the blood instead of going into the tissue cells. As a result, high blood sugar (hyperglycemia) develops. The effect of high sugar (glucose) levels can cause many complications. Type 2 diabetes was also previously called adult-onset diabetes, but it can occur at any age.  RISK FACTORS  A person is predisposed to developing type 2 diabetes if someone in the family has the disease and also has one or more of the following primary risk factors:  Overweight.  An inactive lifestyle.  A history of consistently eating high-calorie foods. Maintaining a normal weight and regular physical activity can reduce the chance of developing type 2 diabetes. SYMPTOMS  A person with type 2 diabetes may not show symptoms initially. The  symptoms of type 2 diabetes appear slowly. The symptoms include:  Increased thirst (polydipsia).  Increased urination (polyuria).  Increased urination during the night (nocturia).  Weight loss. This weight loss may be rapid.  Frequent, recurring infections.  Tiredness (fatigue).  Weakness.  Vision changes, such as blurred vision.  Fruity smell to your breath.  Abdominal pain.  Nausea or vomiting.  Cuts or bruises which are slow to heal.  Tingling or numbness in the hands or feet. DIAGNOSIS Type 2 diabetes is frequently not diagnosed until complications of diabetes are present. Type 2 diabetes is  diagnosed when symptoms or complications are present and when blood glucose levels are increased. Your blood glucose level may be checked by one or more of the following blood tests:  A fasting blood glucose test. You will not be allowed to eat for at least 8 hours before a blood sample is taken.  A random blood glucose test. Your blood glucose is checked at any time of the day regardless of when you ate.  A hemoglobin A1c blood glucose test. A hemoglobin A1c test provides information about blood glucose control over the previous 3 months.  An oral glucose tolerance test (OGTT). Your blood glucose is measured after you have not eaten (fasted) for 2 hours and then after you drink a glucose-containing beverage. TREATMENT   You may need to take insulin or diabetes medicine daily to keep blood glucose levels in the desired range.  If you use insulin, you may need to adjust the dosage depending on the carbohydrates that you eat with each meal or snack. The treatment goal is to maintain the before meal blood sugar (preprandial glucose) level at 70-130 mg/dL. HOME CARE INSTRUCTIONS   Have your hemoglobin A1c level checked twice a year.  Perform daily blood glucose monitoring as directed by your health care provider.  Monitor urine ketones when you are ill and as directed by your health care provider.  Take your diabetes medicine or insulin as directed by your health care provider to maintain your blood glucose levels in the desired range.  Never run out of diabetes medicine or insulin. It is needed every day.  If you are using insulin, you may need to adjust the amount of insulin given based on your intake of carbohydrates. Carbohydrates can raise blood glucose levels but need to be included in your diet. Carbohydrates provide vitamins, minerals, and fiber which are an essential part of a healthy diet. Carbohydrates are found in fruits, vegetables, whole grains, dairy products, legumes, and foods  containing added sugars.  Eat healthy foods. You should make an appointment to see a registered dietitian to help you create an eating plan that is right for you.  Lose weight if you are overweight.  Carry a medical alert card or wear your medical alert jewelry.  Carry a 15-gram carbohydrate snack with you at all times to treat low blood glucose (hypoglycemia). Some examples of 15-gram carbohydrate snacks include:  Glucose tablets, 3 or 4.  Glucose gel, 15-gram tube.  Raisins, 2 tablespoons (24 grams).  Jelly beans, 6.  Animal crackers, 8.  Regular pop, 4 ounces (120 mL).  Gummy treats, 9.  Recognize hypoglycemia. Hypoglycemia occurs with blood glucose levels of 70 mg/dL and below. The risk for hypoglycemia increases when fasting or skipping meals, during or after intense exercise, and during sleep. Hypoglycemia symptoms can include:  Tremors or shakes.  Decreased ability to concentrate.  Sweating.  Increased heart rate.  Headache.  Dry mouth.  Hunger.  Irritability.  Anxiety.  Restless sleep.  Altered speech or coordination.  Confusion.  Treat hypoglycemia promptly. If you are alert and able to safely swallow, follow the 15:15 rule:  Take 15-20 grams of rapid-acting glucose or carbohydrate. Rapid-acting options include glucose gel, glucose tablets, or 4 ounces (120 mL) of fruit juice, regular soda, or low-fat milk.  Check your blood glucose level 15 minutes after taking the glucose.  Take 15-20 grams more of glucose if the repeat blood glucose level is still 70 mg/dL or below.  Eat a meal or snack within 1 hour once blood glucose levels return to normal.  Be alert to feeling very thirsty and urinating more frequently than usual, which are early signs of hyperglycemia. An early awareness of hyperglycemia allows for prompt treatment. Treat hyperglycemia as directed by your health care provider.  Engage in at least 150 minutes of moderate-intensity  physical activity a week, spread over at least 3 days of the week or as directed by your health care provider. In addition, you should engage in resistance exercise at least 2 times a week or as directed by your health care provider. Try to spend no more than 90 minutes at one time inactive.  Adjust your medicine and food intake as needed if you start a new exercise or sport.  Follow your sick-day plan anytime you are unable to eat or drink as usual.  Do not use any tobacco products including cigarettes, chewing tobacco, or electronic cigarettes. If you need help quitting, ask your health care provider.  Limit alcohol intake to no more than 1 drink per day for nonpregnant women and 2 drinks per day for men. You should drink alcohol only when you are also eating food. Talk with your health care provider whether alcohol is safe for you. Tell your health care provider if you drink alcohol several times a week.  Keep all follow-up visits as directed by your health care provider. This is important.  Schedule an eye exam soon after the diagnosis of type 2 diabetes and then annually.  Perform daily skin and foot care. Examine your skin and feet daily for cuts, bruises, redness, nail problems, bleeding, blisters, or sores. A foot exam by a health care provider should be done annually.  Brush your teeth and gums at least twice a day and floss at least once a day. Follow up with your dentist regularly.  Share your diabetes management plan with your workplace or school.  Stay up-to-date with immunizations. It is recommended that people with diabetes who are over 72 years old get the pneumonia vaccine. In some cases, two separate shots may be given. Ask your health care provider if your pneumonia vaccination is up-to-date.  Learn to manage stress.  Obtain ongoing diabetes education and support as needed.  Participate in or seek rehabilitation as needed to maintain or improve independence and quality  of life. Request a physical or occupational therapy referral if you are having foot or hand numbness, or difficulties with grooming, dressing, eating, or physical activity. SEEK MEDICAL CARE IF:   You are unable to eat food or drink fluids for more than 6 hours.  You have nausea and vomiting for more than 6 hours.  Your blood glucose level is over 240 mg/dL.  There is a change in mental status.  You develop an additional serious illness.  You have diarrhea for more than 6 hours.  You have been sick or have had a fever for a couple  of days and are not getting better.  You have pain during any physical activity.  SEEK IMMEDIATE MEDICAL CARE IF:  You have difficulty breathing.  You have moderate to large ketone levels. MAKE SURE YOU:  Understand these instructions.  Will watch your condition.  Will get help right away if you are not doing well or get worse. Document Released: 11/16/2005 Document Revised: 04/02/2014 Document Reviewed: 06/14/2012 [facility] Patient Information 2015 [place], [place]. This information is not intended to replace advice given to you by your health care provider. Make sure you discuss any questions you have with your health care provider.

## 2014-08-16 LAB — MICROALBUMIN, URINE: Microalb, Ur: 0.62 mg/dL (ref 0.00–1.89)

## 2014-08-21 ENCOUNTER — Ambulatory Visit: Payer: PRIVATE HEALTH INSURANCE | Attending: Internal Medicine

## 2014-08-21 ENCOUNTER — Encounter: Payer: Self-pay | Admitting: Obstetrics and Gynecology

## 2014-08-21 NOTE — Progress Notes (Signed)
Patient dropped off patient assistance form to be filled out.  Please call Melanie Phillips at [place] to pick up form when completed.

## 2014-08-22 NOTE — Progress Notes (Signed)
Will hold form for now until Elray Mcgregor investigates how pt was set up for this form. Braeleigh Pyper, Salome Spotted

## 2014-08-23 NOTE — Progress Notes (Signed)
This was brought to my attention yesterday.  Further investigation found that patient does not qualify for the medication asssitance program at [place].  This is a program only for patient's established with that clinic.  She is an established patient at [facility] who has [facility] coverage therefore, she should go to the [facility] to apply for their MAP assistance.  I have instructed Cathy at [facility] to contact patient with this update.

## 2014-09-20 ENCOUNTER — Ambulatory Visit (INDEPENDENT_AMBULATORY_CARE_PROVIDER_SITE_OTHER): Payer: No Typology Code available for payment source | Admitting: Obstetrics and Gynecology

## 2014-09-20 ENCOUNTER — Encounter: Payer: Self-pay | Admitting: Obstetrics and Gynecology

## 2014-09-20 VITALS — BP 104/71 | HR 80 | Temp 98.1°F | Wt 250.0 lb

## 2014-09-20 DIAGNOSIS — M9908 Segmental and somatic dysfunction of rib cage: Secondary | ICD-10-CM

## 2014-09-20 DIAGNOSIS — E119 Type 2 diabetes mellitus without complications: Secondary | ICD-10-CM

## 2014-09-20 DIAGNOSIS — M999 Biomechanical lesion, unspecified: Secondary | ICD-10-CM

## 2014-09-20 MED ORDER — CYCLOBENZAPRINE HCL 10 MG PO TABS: 10.0000 mg | ORAL_TABLET | Freq: Three times a day (TID) | ORAL | Status: DC | PRN

## 2014-09-20 NOTE — Progress Notes (Signed)
     Subjective: Chief Complaint  Patient presents with  . Diabetes  . Abdominal Pain    right side pain    HPI: Melanie Phillips is a 48 y.o. presenting to clinic today to discuss the following:  #Diabetes:  Sugars - High at home: 154    Low at home: 3 Taking medications: Yes Side effects: Hypoglycemia (yes sweating) ROS: denies fever, chills, dizziness, LOC, polyuria, polydipsia, numbness or tingling in extremities or chest pain. Last eye exam: 08/2014 Visual distrubances: none Last foot exam: 07/2014 Nephropathy screen indicated?: No She tries to exercise and walk for 30 min/day. However she has gained 5lbs since last visit.  #R. Side Pain - She has had a problem with her right side for years. She was previously told her rib was out of place. Had some manipulation that helped some at that time. She says there is a feeling of a knot in the area. Was told it as a muscle spasm told. Says that when she moves feels like knot roles with her. Cramping feeling never loosens always tight. 9/10 at worst. Can't lay on it at night. Sometimes has referred pain to chest. Ibuprofen not working. Has used flexeral before with some relief.   #Health Maintenance: Patient went to the eye doctor for her opthalmologic exam. No diabetic retinopathy.  F/u in a year.   All systems were reviewed and were negative unless otherwise noted in the HPI Past Medical, Surgical, Social, and Family History Reviewed & Updated per EMR.  Objective: BP 104/71  Pulse 80  Temp(Src) 98.1 F (36.7 C) (Oral)  Wt 250 lb (113.399 kg) General: alert, well-developed, NAD, cooperative Lungs: CTAB, normal respiratory effort Heart: RRR, no M/R/G.  Abdomen: Bowel sounds normal; abdomen soft and nontender. Msk: TTP over R. 9th rib laterally, no masses or lesions seen. No erythema. Neurologic: No focal deficits, A&Ox3.  Skin: Intact without suspicious lesions or rashes. Warm and dry.   Assessment/Plan: Please see problem  based Assessment and [place], DO 09/20/2014, 1:48 PM PGY-1, [place]

## 2014-09-20 NOTE — Assessment & Plan Note (Signed)
Blood sugars well-controlled. Patient experiencing hypoglycemia. Adjusted regimen to Lantus 20U at bedtime and to continue taking Metformin. She is to stop her mealtime coverage at this time. She was instructed that if she notices her mealtime sugars to be above 150 that she should take 5U of Novolog. She is to schedule a pharmacy clinic appointment with Dr. Valentina Lucks. F/u with me in 3 months for A1c recheck.

## 2014-09-20 NOTE — Patient Instructions (Addendum)
Ms. Baggett it was great to see you today!  I am pleased to hear that things are going well for you. Your sugars look great.  Here are some of the things we discussed today: -I want you to take Lantus 20U at bedtime with no mealtime coverage. Also continue taking your Metformin, -If you notice that your sugars are above 150 before meals take 5U of Novolog. -Please be aware of the signs of hypoglycemia and treat accordingly. Having low sugars is more worrisome than high one. Please seek immediate help if they are not able to bounce back after juice or candy.  -I am prescribing you Flexeril again for you muscle spasm. Let me know how that works.   Please schedule a follow-up appointment for 3 months so I can recheck your diabetes control. Also schedule an appointment with our pharmacist for his diabetes clinic. He will help me with your sugar control.   Thanks for allowing me to be a part of your care! Dr. Gerarda Fraction

## 2014-09-21 NOTE — Assessment & Plan Note (Signed)
A: Chronic P: Patient encouraged to continue using ibuprofen for relief. Also gave her a Rx for Flexeril. Consider tender point injection if continues to bother patient.

## 2014-10-10 ENCOUNTER — Encounter (HOSPITAL_COMMUNITY): Payer: Self-pay | Admitting: Emergency Medicine

## 2014-10-10 ENCOUNTER — Emergency Department (HOSPITAL_COMMUNITY)
Admission: EM | Admit: 2014-10-10 | Discharge: 2014-10-10 | Disposition: A | Discharge status: Home / Self Care | Payer: No Typology Code available for payment source | Attending: Emergency Medicine | Admitting: Emergency Medicine

## 2014-10-10 DIAGNOSIS — Z794 Long term (current) use of insulin: Secondary | ICD-10-CM | POA: Insufficient documentation

## 2014-10-10 DIAGNOSIS — Z7982 Long term (current) use of aspirin: Secondary | ICD-10-CM | POA: Insufficient documentation

## 2014-10-10 DIAGNOSIS — M25561 Pain in right knee: Secondary | ICD-10-CM | POA: Insufficient documentation

## 2014-10-10 DIAGNOSIS — Z79899 Other long term (current) drug therapy: Secondary | ICD-10-CM | POA: Insufficient documentation

## 2014-10-10 DIAGNOSIS — M5431 Sciatica, right side: Secondary | ICD-10-CM

## 2014-10-10 DIAGNOSIS — Z720 Tobacco use: Secondary | ICD-10-CM | POA: Insufficient documentation

## 2014-10-10 DIAGNOSIS — I10 Essential (primary) hypertension: Secondary | ICD-10-CM | POA: Insufficient documentation

## 2014-10-10 DIAGNOSIS — G8929 Other chronic pain: Secondary | ICD-10-CM

## 2014-10-10 DIAGNOSIS — M1990 Unspecified osteoarthritis, unspecified site: Secondary | ICD-10-CM | POA: Insufficient documentation

## 2014-10-10 MED ORDER — OXYCODONE-ACETAMINOPHEN 5-325 MG PO TABS
2.0000 | ORAL_TABLET | Freq: Once | ORAL | Status: AC
  Administered 2014-10-10: 2 via ORAL
  Filled 2014-10-10: qty 2

## 2014-10-10 MED ORDER — TRAMADOL HCL 50 MG PO TABS: 50.0000 mg | ORAL_TABLET | Freq: Four times a day (QID) | ORAL | Status: DC | PRN

## 2014-10-10 MED ORDER — MELOXICAM 7.5 MG PO TABS: 7.5000 mg | ORAL_TABLET | Freq: Every day | ORAL | Status: DC

## 2014-10-10 NOTE — ED Provider Notes (Signed)
CSN: [id number]     Arrival date & time 10/10/14  1015 History  This chart was scribed for non-physician practitioner, Noland Fordyce, PA-C, working with Pamella Pert, MD by Ladene Artist, ED Scribe. This patient was seen in room TR07C/TR07C and the patient's care was started at 10:31 AM.   Chief Complaint  Patient presents with  . Leg Pain   The history is provided by the patient. No language interpreter was used.   HPI Comments: Melanie Phillips is a 48 y.o. female, with a h/o HTN, arthritis, who presents to the Emergency Department complaining of constant R leg pain onset last night. She reports that her knee "gave out" on her last night as she was walking. Pt states that pain radiates from her R buttocks and thigh into her lower leg. Pt describes the pain as a throbbing and aching sensation. Pain is exacerbated with walking. She denies numbness/tingling in lower extremities. She also denies fall or heavy lifting. No h/o knee or back surgery. Pt reports h/o sciatica. She has tried ibuprofen once last night without relief. Pt reports follow-up with ortho in the past. PCP: Luiz Blare  Past Medical History  Diagnosis Date  . Hypertension   . Arthritis   . Back pain    Past Surgical History  Procedure Laterality Date  . Tubal ligation     No family history on file. History  Substance Use Topics  . Smoking status: Current Every Day Smoker -- 0.25 packs/day    Types: Cigarettes  . Smokeless tobacco: Not on file     Comment: using electronic cigs with regular cigs  . Alcohol Use: No   OB History    No data available     Review of Systems  Musculoskeletal: Positive for myalgias.  All other systems reviewed and are negative.  Allergies  Review of patient's allergies indicates no known allergies.  Home Medications   Prior to Admission medications   Medication Sig Start Date End Date Taking? Authorizing Provider  ACCU-CHEK FASTCLIX LANCETS MISC Use as needed to check blood  sugar. 08/13/14   [place] N Rumley, DO  aspirin EC 81 MG tablet Take 1 tablet (81 mg total) by mouth daily. 08/15/14   Katheren Shams, DO  atorvastatin (LIPITOR) 40 MG tablet Take 1 tablet (40 mg total) by mouth daily. 08/13/14   [place] N Rumley, DO  Blood Glucose Monitoring Suppl (ACCU-CHEK NANO SMARTVIEW) W/DEVICE KIT 1 kit by Does not apply route 4 (four) times daily -  before meals and at bedtime. 08/13/14   [place] N Rumley, DO  cyclobenzaprine (FLEXERIL) 10 MG tablet Take 1 tablet (10 mg total) by mouth 3 (three) times daily as needed for muscle spasms. 09/20/14   Katheren Shams, DO  glucose blood (ACCU-CHEK SMARTVIEW) test strip Use as instructed 08/13/14   [place] N Rumley, DO  ibuprofen (ADVIL,MOTRIN) 200 MG tablet Take 600 mg by mouth every 6 (six) hours as needed for moderate pain.    Historical Provider, MD  insulin aspart (NOVOLOG) 100 UNIT/ML injection Inject 10 Units into the skin 3 (three) times daily with meals. 08/13/14   [place] N Rumley, DO  insulin glargine (LANTUS) 100 UNIT/ML injection Inject 0.3 mLs (30 Units total) into the skin daily. 08/13/14   [place] N Rumley, DO  Insulin Syringe-Needle U-100 (INSULIN SYRINGE 1CC/31GX5/16") 31G X 5/16" 1 ML MISC Use as indicated to administer insulin. 08/13/14   [place] N Rumley, DO  lisinopril (PRINIVIL,ZESTRIL) 5 MG tablet Take 0.5  tablets (2.5 mg total) by mouth daily. 08/15/14   Katheren Shams, DO  meloxicam (MOBIC) 7.5 MG tablet Take 1 tablet (7.5 mg total) by mouth daily. 10/10/14   Noland Fordyce, PA-C  metFORMIN (GLUCOPHAGE) 500 MG tablet Take 1 tablet (500 mg total) by mouth 2 (two) times daily with a meal. 08/13/14   [place] N Rumley, DO  nicotine (NICODERM CQ - DOSED IN MG/24 HR) 7 mg/24hr patch Place 1 patch (7 mg total) onto the skin daily. 08/15/14   Katheren Shams, DO  traMADol (ULTRAM) 50 MG tablet Take 1 tablet (50 mg total) by mouth every 6 (six) hours as needed. 10/10/14   Noland Fordyce, PA-C   Triage Vitals: BP 115/68 mmHg   Pulse 86  Temp(Src) 97.2 F (36.2 C) (Oral)  SpO2 100% Physical Exam  Constitutional: She is oriented to person, place, and time. She appears well-developed and well-nourished.  HENT:  Head: Normocephalic and atraumatic.  Eyes: EOM are normal.  Neck: Normal range of motion.  Cardiovascular: Normal rate.   Pulmonary/Chest: Effort normal.  Musculoskeletal: Normal range of motion.  Back: R lumbar and musculature tenderness Tender over R buttock and R lateral thigh No midline spinal tenderness R knee: No edema No deformity Full ROM Tenderness to lateral joint space line No erythema, warmth, ecchymosis  Neurological: She is alert and oriented to person, place, and time.  Skin: Skin is warm and dry.  Psychiatric: She has a normal mood and affect. Her behavior is normal.  Nursing note and vitals reviewed.  ED Course  Procedures (including critical care time) DIAGNOSTIC STUDIES: Oxygen Saturation is 100% on RA, normal by my interpretation.    COORDINATION OF CARE: 10:37 AM-Discussed treatment plan which includes anti-inflammatories and follow-up with ortho with pt at bedside and pt agreed to plan.   Labs Review Labs Reviewed - No data to display  Imaging Review No results found.   EKG Interpretation None      MDM   Final diagnoses:  Chronic knee pain, right  Right sided sciatica    Pt c/ right sided "sciatic" pain and right knee pain. Pt denies falls or known injury. Reports hx of sciatic in the past.  Pt also reports having "no cartilage" in her knees and has been advised by an orthopedist she may need a knee replacement later on.  No evidence of underlying infect and no hx of trauma. No indication for imaging at this time. Will discharge home with a knee sleeve and crutches.  Due to pain being chronic in nature, percocet given in ED but pt discharged with tramadol and mobic. Advised to f/u with PCP, orthopedics and pain management for continued pain. Pt verbalized  understanding and agreement with tx plan.  I personally performed the services described in this documentation, which was scribed in my presence. The recorded information has been reviewed and is accurate.    Noland Fordyce, PA-C 10/10/14 [place], MD 10/11/14 1038

## 2014-10-10 NOTE — ED Notes (Signed)
States last night, "my right leg just gave out on me". C/o pain right thigh to right lower leg. No known injury. States has hx of sciatica.

## 2014-10-18 ENCOUNTER — Ambulatory Visit: Payer: PRIVATE HEALTH INSURANCE | Admitting: Pharmacist

## 2015-01-01 ENCOUNTER — Ambulatory Visit (INDEPENDENT_AMBULATORY_CARE_PROVIDER_SITE_OTHER): Payer: No Typology Code available for payment source | Admitting: Obstetrics and Gynecology

## 2015-01-01 ENCOUNTER — Encounter: Payer: Self-pay | Admitting: Obstetrics and Gynecology

## 2015-01-01 VITALS — BP 137/82 | HR 84 | Temp 97.7°F | Ht 69.0 in | Wt 257.0 lb

## 2015-01-01 DIAGNOSIS — F17200 Nicotine dependence, unspecified, uncomplicated: Secondary | ICD-10-CM

## 2015-01-01 DIAGNOSIS — Z720 Tobacco use: Secondary | ICD-10-CM

## 2015-01-01 DIAGNOSIS — M9908 Segmental and somatic dysfunction of rib cage: Secondary | ICD-10-CM

## 2015-01-01 DIAGNOSIS — E119 Type 2 diabetes mellitus without complications: Secondary | ICD-10-CM

## 2015-01-01 LAB — POCT GLYCOSYLATED HEMOGLOBIN (HGB A1C): Hemoglobin A1C: 6.5

## 2015-01-01 MED ORDER — TRAMADOL HCL 50 MG PO TABS: 50.0000 mg | ORAL_TABLET | Freq: Three times a day (TID) | ORAL | Status: DC | PRN

## 2015-01-01 MED ORDER — INSULIN GLARGINE 100 UNIT/ML ~~LOC~~ SOLN: 15.0000 [IU] | Freq: Every day | SUBCUTANEOUS | Status: DC

## 2015-01-01 NOTE — Assessment & Plan Note (Signed)
A: Chronic issue. Intermittent. P: Flexeril helped pain along with ibuprofen. Patient would like to try the tender point injection at next visit if still an issue. She is to continue conservative measures. Appears to be MSK in nature but may consider imaging due to chronicity.

## 2015-01-01 NOTE — Assessment & Plan Note (Signed)
Patient unable to afford nicotine patches. She said she is trying to cut down and ultimately wants to quit.

## 2015-01-01 NOTE — Patient Instructions (Addendum)
Pleas schedule mammogram and colonoscopy; you will need to schedule appointment for pap smear. Return in 3 months    Diabetes and Exercise Exercising regularly is important. It is not just about losing weight. It has many health benefits, such as:  Improving your overall fitness, flexibility, and endurance.  Increasing your bone density.  Helping with weight control.  Decreasing your body fat.  Increasing your muscle strength.  Reducing stress and tension.  Improving your overall health. People with diabetes who exercise gain additional benefits because exercise:  Reduces appetite.  Improves the body's use of blood sugar (glucose).  Helps lower or control blood glucose.  Decreases blood pressure.  Helps control blood lipids (such as cholesterol and triglycerides).  Improves the body's use of the hormone insulin by:  Increasing the body's insulin sensitivity.  Reducing the body's insulin needs.  Decreases the risk for heart disease because exercising:  Lowers cholesterol and triglycerides levels.  Increases the levels of good cholesterol (such as high-density lipoproteins [HDL]) in the body.  Lowers blood glucose levels. YOUR ACTIVITY PLAN  Choose an activity that you enjoy and set realistic goals. Your health care provider or diabetes educator can help you make an activity plan that works for you. Exercise regularly as directed by your health care provider. This includes:  Performing resistance training twice a week such as push-ups, sit-ups, lifting weights, or using resistance bands.  Performing 150 minutes of cardio exercises each week such as walking, running, or playing sports.  Staying active and spending no more than 90 minutes at one time being inactive. Even short bursts of exercise are good for you. Three 10-minute sessions spread throughout the day are just as beneficial as a single 30-minute session. Some exercise ideas include:  Taking the dog for a  walk.  Taking the stairs instead of the elevator.  Dancing to your favorite song.  Doing an exercise video.  Doing your favorite exercise with a friend. RECOMMENDATIONS FOR EXERCISING WITH TYPE 1 OR TYPE 2 DIABETES   Check your blood glucose before exercising. If blood glucose levels are greater than 240 mg/dL, check for urine ketones. Do not exercise if ketones are present.  Avoid injecting insulin into areas of the body that are going to be exercised. For example, avoid injecting insulin into:  The arms when playing tennis.  The legs when jogging.  Keep a record of:  Food intake before and after you exercise.  Expected peak times of insulin action.  Blood glucose levels before and after you exercise.  The type and amount of exercise you have done.  Review your records with your health care provider. Your health care provider will help you to develop guidelines for adjusting food intake and insulin amounts before and after exercising.  If you take insulin or oral hypoglycemic agents, watch for signs and symptoms of hypoglycemia. They include:  Dizziness.  Shaking.  Sweating.  Chills.  Confusion.  Drink plenty of water while you exercise to prevent dehydration or heat stroke. Body water is lost during exercise and must be replaced.  Talk to your health care provider before starting an exercise program to make sure it is safe for you. Remember, almost any type of activity is better than none. Document Released: 02/06/2004 Document Revised: 04/02/2014 Document Reviewed: 04/25/2013 [facility] Patient Information 2015 [place], [place]. This information is not intended to replace advice given to you by your health care provider. Make sure you discuss any questions you have with your health care provider.  Smoking Cessation Quitting smoking is important to your health and has many advantages. However, it is not always easy to quit since nicotine is a very addictive drug.  Oftentimes, people try 3 times or more before being able to quit. This document explains the best ways for you to prepare to quit smoking. Quitting takes hard work and a lot of effort, but you can do it. ADVANTAGES OF QUITTING SMOKING  You will live longer, feel better, and live better.  Your body will feel the impact of quitting smoking almost immediately.  Within 20 minutes, blood pressure decreases. Your pulse returns to its normal level.  After 8 hours, carbon monoxide levels in the blood return to normal. Your oxygen level increases.  After 24 hours, the chance of having a heart attack starts to decrease. Your breath, hair, and body stop smelling like smoke.  After 48 hours, damaged nerve endings begin to recover. Your sense of taste and smell improve.  After 72 hours, the body is virtually free of nicotine. Your bronchial tubes relax and breathing becomes easier.  After 2 to 12 weeks, lungs can hold more air. Exercise becomes easier and circulation improves.  The risk of having a heart attack, stroke, cancer, or lung disease is greatly reduced.  After 1 year, the risk of coronary heart disease is cut in half.  After 5 years, the risk of stroke falls to the same as a nonsmoker.  After 10 years, the risk of lung cancer is cut in half and the risk of other cancers decreases significantly.  After 15 years, the risk of coronary heart disease drops, usually to the level of a nonsmoker.  If you are pregnant, quitting smoking will improve your chances of having a healthy baby.  The people you live with, especially any children, will be healthier.  You will have extra money to spend on things other than cigarettes. QUESTIONS TO THINK ABOUT BEFORE ATTEMPTING TO QUIT You may want to talk about your answers with your health care provider.  Why do you want to quit?  If you tried to quit in the past, what helped and what did not?  What will be the most difficult situations for you  after you quit? How will you plan to handle them?  Who can help you through the tough times? Your family? Friends? A health care provider?  What pleasures do you get from smoking? What ways can you still get pleasure if you quit? Here are some questions to ask your health care provider:  How can you help me to be successful at quitting?  What medicine do you think would be best for me and how should I take it?  What should I do if I need more help?  What is smoking withdrawal like? How can I get information on withdrawal? GET READY  Set a quit date.  Change your environment by getting rid of all cigarettes, ashtrays, matches, and lighters in your home, car, or work. Do not let people smoke in your home.  Review your past attempts to quit. Think about what worked and what did not. GET SUPPORT AND ENCOURAGEMENT You have a better chance of being successful if you have help. You can get support in many ways.  Tell your family, friends, and coworkers that you are going to quit and need their support. Ask them not to smoke around you.  Get individual, group, or telephone counseling and support. Programs are available at [employer] and health centers.  Call your local health department for information about programs in your area.  Spiritual beliefs and practices may help some smokers quit.  Download a "quit meter" on your computer to keep track of quit statistics, such as how long you have gone without smoking, cigarettes not smoked, and money saved.  Get a self-help book about quitting smoking and staying off tobacco. [place] yourself from urges to smoke. Talk to someone, go for a walk, or occupy your time with a task.  Change your normal routine. Take a different route to work. Drink tea instead of coffee. Eat breakfast in a different place.  Reduce your stress. Take a hot bath, exercise, or read a book.  Plan something enjoyable to do every  day. Reward yourself for not smoking.  Explore interactive web-based programs that specialize in helping you quit. GET MEDICINE AND USE IT CORRECTLY Medicines can help you stop smoking and decrease the urge to smoke. Combining medicine with the above behavioral methods and support can greatly increase your chances of successfully quitting smoking.  Nicotine replacement therapy helps deliver nicotine to your body without the negative effects and risks of smoking. Nicotine replacement therapy includes nicotine gum, lozenges, inhalers, nasal sprays, and skin patches. Some may be available over-the-counter and others require a prescription.  Antidepressant medicine helps people abstain from smoking, but how this works is unknown. This medicine is available by prescription.  Nicotinic receptor partial agonist medicine simulates the effect of nicotine in your brain. This medicine is available by prescription. Ask your health care provider for advice about which medicines to use and how to use them based on your health history. Your health care provider will tell you what side effects to look out for if you choose to be on a medicine or therapy. Carefully read the information on the package. Do not use any other product containing nicotine while using a nicotine replacement product.  RELAPSE OR DIFFICULT SITUATIONS Most relapses occur within the first 3 months after quitting. Do not be discouraged if you start smoking again. Remember, most people try several times before finally quitting. You may have symptoms of withdrawal because your body is used to nicotine. You may crave cigarettes, be irritable, feel very hungry, cough often, get headaches, or have difficulty concentrating. The withdrawal symptoms are only temporary. They are strongest when you first quit, but they will go away within 10-14 days. To reduce the chances of relapse, try to:  Avoid drinking alcohol. Drinking lowers your chances of  successfully quitting.  Reduce the amount of caffeine you consume. Once you quit smoking, the amount of caffeine in your body increases and can give you symptoms, such as a rapid heartbeat, sweating, and anxiety.  Avoid smokers because they can make you want to smoke.  Do not let weight gain distract you. Many smokers will gain weight when they quit, usually less than 10 pounds. Eat a healthy diet and stay active. You can always lose the weight gained after you quit.  Find ways to improve your mood other than smoking. FOR MORE INFORMATION  www.smokefree.gov  Document Released: 11/10/2001 Document Revised: 04/02/2014 Document Reviewed: 02/25/2012 [facility] Patient Information 2015 [place], [place]. This information is not intended to replace advice given to you by your health care provider. Make sure you discuss any questions you have with your health care provider.

## 2015-01-01 NOTE — Progress Notes (Signed)
     Subjective: Chief Complaint  Patient presents with  . Diabetes  . Flank Pain    right side     HPI: Melanie Phillips is a 49 y.o. presenting to clinic today to discuss the following:  #Diabetes:  Sugars - High at home: 157    Low at home: 34 Taking medications: Lantus 20U and metformin BID Side effects: No hypoglycemia events ROS: denies fever, chills, dizziness, LOC, polyuria, polydipsia, numbness or tingling in extremities or chest pain.  #Rib Pain: Patient states that she still has intermittent flank pain over her ribs. She has had a problem with her right side for years. Current pain regimen working.   #Smoking: Still smoking 5 cigs a day down form 10-15/day.  Can't afford patches. Patient does still want to quit smoking.  Health Maintenance: Due for Mammo, Colonoscopy, and Pap.   All systems were reviewed and were negative unless otherwise noted in the HPI Past Medical, Surgical, Social, and Family History Reviewed & Updated per EMR.   Objective: BP 137/82 mmHg  Pulse 84  Temp(Src) 97.7 F (36.5 C) (Oral)  Ht 5\' 9"  (1.753 m)  Wt 257 lb (116.574 kg)  BMI 37.93 kg/m2  Physical Exam General: alert, well-developed, NAD, cooperative Lungs: CTAB, normal respiratory effort Heart: RRR, no M/R/G.  Abdomen: Bowel sounds normal; abdomen soft and nontender. Msk: TTP over R. 9th rib laterally, no masses or lesions seen. No erythema. Neurologic: No focal deficits, A&Ox3.  Skin: Torturous blood vessel on bilateral legs. Nontender.  Results for orders placed or performed in visit on 01/01/15 (from the past 72 hour(s))  POCT glycosylated hemoglobin (Hb A1C)     Status: None   Collection Time: 01/01/15 10:50 AM  Result Value Ref Range   Hemoglobin A1C 6.5     Assessment/Plan: Please see problem based Assessment and Plan  Health Maintainance: Patient given handout to schedule colonoscopy and mammogram. Pap at next visit.    Orders Placed This Encounter  Procedures   . POCT glycosylated hemoglobin (Hb A1C)    Meds ordered this encounter  Medications  . traMADol (ULTRAM) 50 MG tablet    Sig: Take 1 tablet (50 mg total) by mouth every 8 (eight) hours as needed for severe pain.    Dispense:  30 tablet    Refill:  0    Luiz Blare, DO 01/01/2015, 11:09 AM PGY-1, [place]

## 2015-01-01 NOTE — Assessment & Plan Note (Signed)
A: Controlled. No mor episodes of hypoglycemia. A1c 6.5. P: Lantus adjusted down to 15U with metformin 500mg  BID. Follow-up in 3 months. Patient encouraged to continue diet and exercise.

## 2015-01-25 ENCOUNTER — Other Ambulatory Visit: Payer: Self-pay

## 2015-01-25 DIAGNOSIS — Z1231 Encounter for screening mammogram for malignant neoplasm of breast: Secondary | ICD-10-CM

## 2015-01-30 ENCOUNTER — Ambulatory Visit: Payer: Self-pay

## 2015-04-17 ENCOUNTER — Other Ambulatory Visit: Payer: Self-pay | Admitting: Obstetrics and Gynecology

## 2015-04-17 MED ORDER — METFORMIN HCL 500 MG PO TABS: 500.0000 mg | ORAL_TABLET | Freq: Two times a day (BID) | ORAL | Status: DC

## 2015-04-17 NOTE — Telephone Encounter (Signed)
Needs refill on metformin [facility]

## 2015-04-18 ENCOUNTER — Emergency Department (HOSPITAL_COMMUNITY): Payer: Self-pay

## 2015-04-18 ENCOUNTER — Emergency Department (HOSPITAL_COMMUNITY)
Admission: EM | Admit: 2015-04-18 | Discharge: 2015-04-18 | Disposition: A | Discharge status: Home / Self Care | Payer: Self-pay | Attending: Emergency Medicine | Admitting: Emergency Medicine

## 2015-04-18 ENCOUNTER — Encounter (HOSPITAL_COMMUNITY): Payer: Self-pay | Admitting: *Deleted

## 2015-04-18 DIAGNOSIS — R1013 Epigastric pain: Secondary | ICD-10-CM | POA: Insufficient documentation

## 2015-04-18 DIAGNOSIS — I10 Essential (primary) hypertension: Secondary | ICD-10-CM | POA: Insufficient documentation

## 2015-04-18 DIAGNOSIS — Z720 Tobacco use: Secondary | ICD-10-CM | POA: Insufficient documentation

## 2015-04-18 DIAGNOSIS — E119 Type 2 diabetes mellitus without complications: Secondary | ICD-10-CM | POA: Insufficient documentation

## 2015-04-18 DIAGNOSIS — E785 Hyperlipidemia, unspecified: Secondary | ICD-10-CM | POA: Insufficient documentation

## 2015-04-18 DIAGNOSIS — Z7982 Long term (current) use of aspirin: Secondary | ICD-10-CM | POA: Insufficient documentation

## 2015-04-18 DIAGNOSIS — Z794 Long term (current) use of insulin: Secondary | ICD-10-CM | POA: Insufficient documentation

## 2015-04-18 DIAGNOSIS — R110 Nausea: Secondary | ICD-10-CM | POA: Insufficient documentation

## 2015-04-18 DIAGNOSIS — Z9851 Tubal ligation status: Secondary | ICD-10-CM | POA: Insufficient documentation

## 2015-04-18 DIAGNOSIS — Z791 Long term (current) use of non-steroidal anti-inflammatories (NSAID): Secondary | ICD-10-CM | POA: Insufficient documentation

## 2015-04-18 DIAGNOSIS — R1011 Right upper quadrant pain: Secondary | ICD-10-CM | POA: Insufficient documentation

## 2015-04-18 DIAGNOSIS — M1990 Unspecified osteoarthritis, unspecified site: Secondary | ICD-10-CM | POA: Insufficient documentation

## 2015-04-18 DIAGNOSIS — Z79899 Other long term (current) drug therapy: Secondary | ICD-10-CM | POA: Insufficient documentation

## 2015-04-18 HISTORY — DX: Hyperlipidemia, unspecified: E78.5

## 2015-04-18 HISTORY — DX: Type 2 diabetes mellitus without complications: E11.9

## 2015-04-18 LAB — URINALYSIS, ROUTINE W REFLEX MICROSCOPIC
Bilirubin Urine: NEGATIVE
Glucose, UA: NEGATIVE mg/dL
Hgb urine dipstick: NEGATIVE
KETONES UR: NEGATIVE mg/dL
LEUKOCYTES UA: NEGATIVE
Nitrite: NEGATIVE
PH: 6 (ref 5.0–8.0)
Protein, ur: NEGATIVE mg/dL
Specific Gravity, Urine: 1.015 (ref 1.005–1.030)
Urobilinogen, UA: 0.2 mg/dL (ref 0.0–1.0)

## 2015-04-18 LAB — COMPREHENSIVE METABOLIC PANEL
ALBUMIN: 3.6 g/dL (ref 3.5–5.0)
ALK PHOS: 83 U/L (ref 38–126)
ALT: 13 U/L — AB (ref 14–54)
ANION GAP: 10 (ref 5–15)
AST: 16 U/L (ref 15–41)
BUN: 10 mg/dL (ref 6–20)
CHLORIDE: 103 mmol/L (ref 101–111)
CO2: 23 mmol/L (ref 22–32)
Calcium: 8.9 mg/dL (ref 8.9–10.3)
Creatinine, Ser: 0.88 mg/dL (ref 0.44–1.00)
GFR calc Af Amer: >60 mL/min (ref >60)
GFR calc non Af Amer: >60 mL/min (ref >60)
Glucose, Bld: 158 mg/dL — ABNORMAL HIGH (ref 65–99)
POTASSIUM: 4.1 mmol/L (ref 3.5–5.1)
SODIUM: 136 mmol/L (ref 135–145)
Total Bilirubin: 0.6 mg/dL (ref 0.3–1.2)
Total Protein: 7 g/dL (ref 6.5–8.1)

## 2015-04-18 LAB — CBC WITH DIFFERENTIAL/PLATELET
BASOS ABS: 0 10*3/uL (ref 0.0–0.1)
Basophils Relative: 0 % (ref 0–1)
EOS PCT: 1 % (ref 0–5)
Eosinophils Absolute: 0.1 10*3/uL (ref 0.0–0.7)
HCT: 43.9 % (ref 36.0–46.0)
Hemoglobin: 15.1 g/dL — ABNORMAL HIGH (ref 12.0–15.0)
LYMPHS ABS: 4 10*3/uL (ref 0.7–4.0)
Lymphocytes Relative: 33 % (ref 12–46)
MCH: 31.3 pg (ref 26.0–34.0)
MCHC: 34.4 g/dL (ref 30.0–36.0)
MCV: 90.9 fL (ref 78.0–100.0)
MONO ABS: 0.7 10*3/uL (ref 0.1–1.0)
Monocytes Relative: 6 % (ref 3–12)
Neutro Abs: 7.2 10*3/uL (ref 1.7–7.7)
Neutrophils Relative %: 60 % (ref 43–77)
Platelets: 404 10*3/uL — ABNORMAL HIGH (ref 150–400)
RBC: 4.83 MIL/uL (ref 3.87–5.11)
RDW: 14.3 % (ref 11.5–15.5)
WBC: 12 10*3/uL — ABNORMAL HIGH (ref 4.0–10.5)

## 2015-04-18 LAB — LIPASE, BLOOD: LIPASE: 20 U/L — AB (ref 22–51)

## 2015-04-18 MED ORDER — SODIUM CHLORIDE 0.9 % IV BOLUS (SEPSIS)
1000.0000 mL | INTRAVENOUS | Status: AC
Start: 2015-04-18 — End: 2015-04-18
  Administered 2015-04-18: 1000 mL via INTRAVENOUS

## 2015-04-18 MED ORDER — GI COCKTAIL ~~LOC~~
30.0000 mL | Freq: Once | ORAL | Status: AC
  Administered 2015-04-18: 30 mL via ORAL
  Filled 2015-04-18: qty 30

## 2015-04-18 MED ORDER — OMEPRAZOLE 20 MG PO CPDR: 20.0000 mg | DELAYED_RELEASE_CAPSULE | Freq: Every day | ORAL | Status: DC

## 2015-04-18 MED ORDER — ONDANSETRON HCL 4 MG/2ML IJ SOLN
4.0000 mg | INTRAMUSCULAR | Status: AC
  Administered 2015-04-18: 4 mg via INTRAVENOUS
  Filled 2015-04-18: qty 2

## 2015-04-18 MED ORDER — MORPHINE SULFATE 4 MG/ML IJ SOLN
4.0000 mg | Freq: Once | INTRAMUSCULAR | Status: AC
  Administered 2015-04-18: 4 mg via INTRAVENOUS
  Filled 2015-04-18: qty 1

## 2015-04-18 NOTE — ED Notes (Signed)
Bed: TM54 Expected date:  Expected time:  Means of arrival:  Comments: 49 yo F  abd pain

## 2015-04-18 NOTE — ED Notes (Signed)
Pt ambulated to room w/ EMS.

## 2015-04-18 NOTE — ED Notes (Signed)
Pt ambulated to restroom, appeared to be in no distress, yawned as walking back to room.

## 2015-04-18 NOTE — ED Notes (Signed)
Per EMS pt from home, RUQ abdominal pain x 2 weeks, can't handle it anymore, pt states when she belches she feels some relief, normal BM's, normal appetite, denies n/v/d, pain 10/10.

## 2015-04-18 NOTE — Discharge Instructions (Signed)
Please follow directions provided. Be sure to follow-up with your primary care doctor to ensure you're getting better. If your pain does not improve please use the GI referral for further evaluation. Please take the omeprazole daily as directed.  Don't hesitate to return for any new, worsening or concerning symptoms.      SEEK IMMEDIATE MEDICAL CARE IF:  Your pain does not go away within 2 hours.  You keep throwing up (vomiting).  Your pain is felt only in portions of the abdomen, such as the right side or the left lower portion of the abdomen.  You pass bloody or black tarry stools.

## 2015-04-18 NOTE — ED Notes (Signed)
US at bedside

## 2015-04-18 NOTE — ED Provider Notes (Signed)
CSN: [id number]     Arrival date & time 04/18/15  1282 History   First MD Initiated Contact with Patient 04/18/15 0325     Chief Complaint  Patient presents with  . Abdominal Pain   (Consider location/radiation/quality/duration/timing/severity/associated sxs/prior Treatment) HPI  Melanie Phillips is a 49 yo female presenting with abd pain.  She reports intermittent RUQ and epigastric pain x 2 weeks.  This most recent episode began appr 7 pm tonight after eating dinner.  She describes the pain as burning and radiates from her epigastric area to her RUQ and sometimes around to her back.  She states tonight while she was lying in bed, the pain made her feel hot and nauseated.  She reports she usually feels the pain at night after having a meal, when she is laying down.  She denies any fevers, chills, chest pain, vomiting or diarrhea.   Past Medical History  Diagnosis Date  . Hypertension   . Arthritis   . Back pain   . Diabetes mellitus without complication   . Hyperlipidemia    Past Surgical History  Procedure Laterality Date  . Tubal ligation     No family history on file. History  Substance Use Topics  . Smoking status: Current Every Day Smoker -- 0.25 packs/day    Types: Cigarettes  . Smokeless tobacco: Not on file     Comment: using electronic cigs with regular cigs  . Alcohol Use: No   OB History    No data available     Review of Systems  Constitutional: Negative for fever and chills.  HENT: Negative for sore throat.   Eyes: Negative for visual disturbance.  Respiratory: Negative for cough and shortness of breath.   Cardiovascular: Negative for chest pain and leg swelling.  Gastrointestinal: Positive for nausea and abdominal pain. Negative for vomiting and diarrhea.  Genitourinary: Negative for dysuria.  Musculoskeletal: Negative for myalgias.  Skin: Negative for rash.  Neurological: Negative for weakness, numbness and headaches.    Allergies  Review of patient's  allergies indicates no known allergies.  Home Medications   Prior to Admission medications   Medication Sig Start Date End Date Taking? Authorizing Provider  ACCU-CHEK FASTCLIX LANCETS MISC Use as needed to check blood sugar. 08/13/14   [place] N Rumley, DO  aspirin EC 81 MG tablet Take 1 tablet (81 mg total) by mouth daily. 08/15/14   Katheren Shams, DO  atorvastatin (LIPITOR) 40 MG tablet Take 1 tablet (40 mg total) by mouth daily. 08/13/14   [place] N Rumley, DO  Blood Glucose Monitoring Suppl (ACCU-CHEK NANO SMARTVIEW) W/DEVICE KIT 1 kit by Does not apply route 4 (four) times daily -  before meals and at bedtime. 08/13/14   [place] N Rumley, DO  cyclobenzaprine (FLEXERIL) 10 MG tablet Take 1 tablet (10 mg total) by mouth 3 (three) times daily as needed for muscle spasms. 09/20/14   Katheren Shams, DO  glucose blood (ACCU-CHEK SMARTVIEW) test strip Use as instructed 08/13/14    N Rumley, DO  ibuprofen (ADVIL,MOTRIN) 200 MG tablet Take 600 mg by mouth every 6 (six) hours as needed for moderate pain.    Historical Provider, MD  insulin aspart (NOVOLOG) 100 UNIT/ML injection Inject 10 Units into the skin 3 (three) times daily with meals. Patient not taking: Reported on 01/01/2015 08/13/14   Burna Cash Rumley, DO  insulin glargine (LANTUS) 100 UNIT/ML injection Inject 0.15 mLs (15 Units total) into the skin at bedtime. 01/01/15  Katheren Shams, DO  Insulin Syringe-Needle U-100 (INSULIN SYRINGE 1CC/31GX5/16") 31G X 5/16" 1 ML MISC Use as indicated to administer insulin. 08/13/14   [place] N Rumley, DO  lisinopril (PRINIVIL,ZESTRIL) 5 MG tablet Take 0.5 tablets (2.5 mg total) by mouth daily. 08/15/14   Katheren Shams, DO  meloxicam (MOBIC) 7.5 MG tablet Take 1 tablet (7.5 mg total) by mouth daily. 10/10/14   Noland Fordyce, PA-C  metFORMIN (GLUCOPHAGE) 500 MG tablet Take 1 tablet (500 mg total) by mouth 2 (two) times daily with a meal. 04/17/15   Katheren Shams, DO  nicotine (NICODERM CQ - DOSED IN MG/24  HR) 7 mg/24hr patch Place 1 patch (7 mg total) onto the skin daily. Patient not taking: Reported on 01/01/2015 08/15/14   Katheren Shams, DO  traMADol (ULTRAM) 50 MG tablet Take 1 tablet (50 mg total) by mouth every 8 (eight) hours as needed for severe pain. 01/01/15   Katheren Shams, DO   BP 124/74 mmHg  Pulse 96  Temp(Src) 97.8 F (36.6 C) (Oral)  Resp 18  SpO2 97% Physical Exam  Constitutional: She appears well-developed and well-nourished. No distress.  HENT:  Head: Normocephalic and atraumatic.  Mouth/Throat: Oropharynx is clear and moist. No oropharyngeal exudate.  Eyes: Conjunctivae are normal.  Neck: Neck supple. No thyromegaly present.  Cardiovascular: Normal rate, regular rhythm and intact distal pulses.   Pulmonary/Chest: Effort normal and breath sounds normal. No respiratory distress. She has no wheezes. She has no rales. She exhibits no tenderness.  Abdominal: Soft. She exhibits no distension and no mass. There is tenderness in the right upper quadrant and epigastric area. There is positive Murphy's sign. There is no rebound, no guarding and no tenderness at McBurney's point.    Musculoskeletal: She exhibits no tenderness.  Lymphadenopathy:    She has no cervical adenopathy.  Neurological: She is alert.  Skin: Skin is warm and dry. No rash noted. She is not diaphoretic.  Psychiatric: She has a normal mood and affect.  Nursing note and vitals reviewed.   ED Course  Procedures (including critical care time) Labs Review Labs Reviewed  CBC WITH DIFFERENTIAL/PLATELET - Abnormal; Notable for the following:    WBC 12.0 (*)    Hemoglobin 15.1 (*)    Platelets 404 (*)    All other components within normal limits  COMPREHENSIVE METABOLIC PANEL - Abnormal; Notable for the following:    Glucose, Bld 158 (*)    ALT 13 (*)    All other components within normal limits  LIPASE, BLOOD - Abnormal; Notable for the following:    Lipase 20 (*)    All other components within normal  limits  URINALYSIS, ROUTINE W REFLEX MICROSCOPIC - Abnormal; Notable for the following:    APPearance CLOUDY (*)    All other components within normal limits    Imaging Review US Abdomen Limited Ruq  04/18/2015   CLINICAL DATA:  RIGHT upper quadrant pain, history of hypertension, diabetes and hyperlipidemia.  EXAM: US ABDOMEN LIMITED - RIGHT UPPER QUADRANT  COMPARISON:  None.  FINDINGS: Gallbladder:  No gallstones or wall thickening visualized. No sonographic Murphy sign noted.  Common bile duct:  Diameter: 3.5 mm  Liver:  No focal lesion identified. Normal echogenicity, no intrahepatic biliary dilatation. Hepatopetal portal vein.  IMPRESSION: No sonographic findings of acute RIGHT upper quadrant process.   Electronically Signed   By: Elon Alas   On: 04/18/2015 05:44     EKG Interpretation None  MDM   Final diagnoses:  RUQ pain  Epigastric pain    49 yo with epigastric and right upper quadrant pain worse after eating and laying down.  Her RUQ TTP on exam suggestive of gall bladder involvement however her AST, ALT, Lipase and Bilirubin are normal.  Her [place] also shows no gall stones or CBD abnormalities.  Her pain was managed in the ED after GI cocktail, so suspect a reflux component.  She is a pt at [facility] and is comfortable with following up with her PCP. Discussed starting a daily PPI. Pt is well-appearing, in no acute distress and vital signs reviewed and not concerning. She appears safe to be discharged.  Discharge include follow-up with their PCP.  Return precautions provided. Pt aware of plan and in agreement.     Filed Vitals:   04/18/15 0324 04/18/15 0542  BP: 124/74 121/74  Pulse: 96 82  Temp: 97.8 F (36.6 C)   TempSrc: Oral   Resp: 18 16  SpO2: 97% 98%   Meds given in ED:  Medications  sodium chloride 0.9 % bolus 1,000 mL (0 mLs Intravenous Stopped 04/18/15 0505)  morphine 4 MG/ML injection 4 mg (4 mg Intravenous Given 04/18/15 0353)  ondansetron (ZOFRAN)  injection 4 mg (4 mg Intravenous Given 04/18/15 0353)  gi cocktail (Maalox,Lidocaine,Donnatal) (30 mLs Oral Given 04/18/15 0505)    Discharge Medication List as of 04/18/2015  5:59 AM    START taking these medications   Details  omeprazole (PRILOSEC) 20 MG capsule Take 1 capsule (20 mg total) by mouth daily., Starting 04/18/2015, Until Discontinued, Print           Britt Bottom, NP 04/19/15 [id number]  Julianne Rice, MD 04/25/15 [phone]

## 2015-05-01 ENCOUNTER — Other Ambulatory Visit: Payer: Self-pay | Admitting: Family Medicine

## 2015-05-01 NOTE — Telephone Encounter (Signed)
Pt did not received Rx for metformin that was approved on 04/17/15.  Refill stated print. Refill resent to [facility] electronically.  Derl Barrow, RN.

## 2015-05-01 NOTE — Addendum Note (Signed)
Addended by: Derl Barrow on: 05/01/2015 11:38 AM   Modules accepted: Orders

## 2015-05-02 ENCOUNTER — Ambulatory Visit: Payer: Self-pay

## 2015-10-18 ENCOUNTER — Other Ambulatory Visit: Payer: Self-pay | Admitting: Obstetrics and Gynecology

## 2015-10-18 NOTE — Telephone Encounter (Signed)
Pt is calling to check status of metformin refill, says she has not made an appt yet because her medicaid is pending. Pt goes to [facility]/ [address]

## 2016-01-23 ENCOUNTER — Other Ambulatory Visit: Payer: Self-pay | Admitting: Obstetrics and Gynecology

## 2016-01-23 NOTE — Telephone Encounter (Signed)
Need diabetes follow-up appointment

## 2016-02-06 ENCOUNTER — Ambulatory Visit: Payer: Self-pay

## 2016-02-17 ENCOUNTER — Ambulatory Visit (INDEPENDENT_AMBULATORY_CARE_PROVIDER_SITE_OTHER): Payer: Self-pay | Admitting: Obstetrics and Gynecology

## 2016-02-17 VITALS — BP 123/52 | HR 79 | Temp 98.1°F | Ht 69.0 in | Wt 267.3 lb

## 2016-02-17 DIAGNOSIS — IMO0001 Reserved for inherently not codable concepts without codable children: Secondary | ICD-10-CM

## 2016-02-17 DIAGNOSIS — R1013 Epigastric pain: Secondary | ICD-10-CM

## 2016-02-17 DIAGNOSIS — E119 Type 2 diabetes mellitus without complications: Secondary | ICD-10-CM

## 2016-02-17 DIAGNOSIS — R030 Elevated blood-pressure reading, without diagnosis of hypertension: Secondary | ICD-10-CM

## 2016-02-17 LAB — POCT GLYCOSYLATED HEMOGLOBIN (HGB A1C): HEMOGLOBIN A1C: 6.9

## 2016-02-17 MED ORDER — METFORMIN HCL 1000 MG PO TABS: 1000.0000 mg | ORAL_TABLET | Freq: Two times a day (BID) | ORAL | Status: DC

## 2016-02-17 MED ORDER — ATORVASTATIN CALCIUM 40 MG PO TABS: 40.0000 mg | ORAL_TABLET | Freq: Every day | ORAL | Status: DC

## 2016-02-17 MED ORDER — GLUCOSE BLOOD VI STRP: ORAL_STRIP | Status: DC

## 2016-02-17 MED ORDER — LISINOPRIL 5 MG PO TABS: 2.5000 mg | ORAL_TABLET | Freq: Every day | ORAL | Status: DC

## 2016-02-17 MED ORDER — OMEPRAZOLE 40 MG PO CPDR: 40.0000 mg | DELAYED_RELEASE_CAPSULE | Freq: Every day | ORAL | Status: DC

## 2016-02-17 NOTE — Assessment & Plan Note (Signed)
BP well-controlled today without medication. Ran out of lisinopril. Only on low dose lisinopril for renal protection with diabetes. No official HTN diagnosis.

## 2016-02-17 NOTE — Assessment & Plan Note (Signed)
Appears to be reflux related abdominal pain based on history and physical. Cannot rule out some type of gallbladder etiology. Patient does not take her Prilosec daily. Rx for prilosec. Patient to monitor to see if symptoms improve on medication. Also discussed bowel regimen. Patient to RTC in a week if not improved. At that time would consider additional work-up and imaging. Last episode of similar abdominal pain in ED in 2016 revealed no gallbladder etiology or abdominal labs. Handout given with return precautions.

## 2016-02-17 NOTE — Progress Notes (Signed)
     Subjective: Chief Complaint  Patient presents with  . Abdominal Pain    x 2 to 3 days     HPI: Melanie Phillips is a 50 y.o. presenting to clinic today to discuss the following:  #ABDOMINAL PAIN Pain began 2-3 days ago Medications tried: Gas-x Location: upper center of abdomen Feels like she has gas Last BM this morning - a little and had to strain some; soft stool Denies heartburn symtpoms Similar pain before: once in emergency room  Not on Prilosec  Prior abdominal surgeries: no  Intermittent pain comes and goes - 30 minutes Seen for this same issue last year Improved with drinking milk  Symptoms Nausea/vomiting: no Diarrhea: no Constipation: maybe Blood in stool: no Fever: no   #Diabetes:  Sugars - High at home: 157     Low at home: 90 Taking medications: metformin only Side effects: none On statin ROS: denies fever, chills, dizziness, LOC, polyuria, polydipsia, numbness or tingling in extremities or chest pain, visual disturbances.  #medication refills -now has orange card -had a lapse in insurance so was unable to get medications -needs refills on everything  Health Maintenance: Needs complete wellness exam/physical   ROS noted in HPI.  Past Medical, Surgical, Social, and Family History Reviewed & Updated per EMR.   Objective: BP 123/52 mmHg  Pulse 79  Temp(Src) 98.1 F (36.7 C) (Oral)  Ht 5\' 9"  (1.753 m)  Wt 267 lb 4.8 oz (121.246 kg)  BMI 39.46 kg/m2 Vitals and nursing notes reviewed  Physical Exam  Constitutional: She is well-developed, well-nourished, and in no distress.  Abdominal: Soft. Normal appearance. She exhibits no distension. Bowel sounds are hypoactive. There is tenderness in the right upper quadrant and epigastric area. There is rebound. There is no guarding.    Results for orders placed or performed in visit on 02/17/16 (from the past 72 hour(s))  POCT glycosylated hemoglobin (Hb A1C)     Status: None   Collection Time:  02/17/16  3:10 PM  Result Value Ref Range   Hemoglobin A1C 6.9     Assessment/Plan: Please see problem based Assessment and Plan  Return to clinic for wellness exam.   Orders Placed This Encounter  Procedures  . POCT glycosylated hemoglobin (Hb A1C)    Meds ordered this encounter  Medications  . atorvastatin (LIPITOR) 40 MG tablet    Sig: Take 1 tablet (40 mg total) by mouth daily.    Dispense:  90 tablet    Refill:  1  . lisinopril (PRINIVIL,ZESTRIL) 5 MG tablet    Sig: Take 0.5 tablets (2.5 mg total) by mouth daily.    Dispense:  90 tablet    Refill:  1  . omeprazole (PRILOSEC) 40 MG capsule    Sig: Take 1 capsule (40 mg total) by mouth daily.    Dispense:  90 capsule    Refill:  1  . glucose blood (ACCU-CHEK SMARTVIEW) test strip    Sig: Use as instructed    Dispense:  100 each    Refill:  12  . metFORMIN (GLUCOPHAGE) 1000 MG tablet    Sig: Take 1 tablet (1,000 mg total) by mouth 2 (two) times daily with a meal.    Dispense:  60 tablet    Refill:  [place], DO 02/17/2016, 2:49 PM PGY-2, [place]

## 2016-02-17 NOTE — Assessment & Plan Note (Signed)
A1c today 6.9. Patient only taking metformin. Sugars well-controlled. Reordered test strips for patient. Increase metformin to 1000mg  BID. Discontinue all insulins.

## 2016-02-17 NOTE — Patient Instructions (Signed)
I am pleased to hear that things are going well for you.  Here are some of the things we discussed today: -Medications printed. Take to pharmacy to fill. -monitor sugars -A1c today 6.9 - continue with just metformin 500mg  BID. Hold on all insulin. -for abdominal pain take Prilosec daily. If not improved in about a week come back in for further work-up.  Please schedule a follow-up appointment for 4 weeks to discuss other medical concerns   Abdominal Pain, Adult Many things can cause belly (abdominal) pain. Most times, the belly pain is not dangerous. Many cases of belly pain can be watched and treated at home. HOME CARE   Do not take medicines that help you go poop (laxatives) unless told to by your doctor.  Only take medicine as told by your doctor.  Eat or drink as told by your doctor. Your doctor will tell you if you should be on a special diet. GET HELP IF:  You do not know what is causing your belly pain.  You have belly pain while you are sick to your stomach (nauseous) or have runny poop (diarrhea).  You have pain while you pee or poop.  Your belly pain wakes you up at night.  You have belly pain that gets worse or better when you eat.  You have belly pain that gets worse when you eat fatty foods.  You have a fever. GET HELP RIGHT AWAY IF:   The pain does not go away within 2 hours.  You keep throwing up (vomiting).  The pain changes and is only in the right or left part of the belly.  You have bloody or tarry looking poop. MAKE SURE YOU:   Understand these instructions.  Will watch your condition.  Will get help right away if you are not doing well or get worse.   This information is not intended to replace advice given to you by your health care provider. Make sure you discuss any questions you have with your health care provider.   Document Released: 05/04/2008 Document Revised: 12/07/2014 Document Reviewed: 07/26/2013 Elsevier Interactive Patient  Education [employer].

## 2016-03-02 ENCOUNTER — Ambulatory Visit: Payer: Self-pay | Admitting: Family Medicine

## 2016-03-09 ENCOUNTER — Ambulatory Visit (INDEPENDENT_AMBULATORY_CARE_PROVIDER_SITE_OTHER): Payer: Self-pay | Admitting: Student

## 2016-03-09 ENCOUNTER — Other Ambulatory Visit: Payer: Self-pay | Admitting: Obstetrics and Gynecology

## 2016-03-09 VITALS — BP 136/98 | HR 117 | Temp 98.2°F | Wt 259.0 lb

## 2016-03-09 DIAGNOSIS — G5600 Carpal tunnel syndrome, unspecified upper limb: Secondary | ICD-10-CM | POA: Insufficient documentation

## 2016-03-09 DIAGNOSIS — F329 Major depressive disorder, single episode, unspecified: Secondary | ICD-10-CM

## 2016-03-09 DIAGNOSIS — G5601 Carpal tunnel syndrome, right upper limb: Secondary | ICD-10-CM

## 2016-03-09 DIAGNOSIS — E1110 Type 2 diabetes mellitus with ketoacidosis without coma: Secondary | ICD-10-CM

## 2016-03-09 DIAGNOSIS — F32A Depression, unspecified: Secondary | ICD-10-CM | POA: Insufficient documentation

## 2016-03-09 DIAGNOSIS — E1310 Other specified diabetes mellitus with ketoacidosis without coma: Secondary | ICD-10-CM

## 2016-03-09 MED ORDER — LISINOPRIL 5 MG PO TABS: 5.0000 mg | ORAL_TABLET | Freq: Every day | ORAL | Status: DC

## 2016-03-09 MED ORDER — SERTRALINE HCL 50 MG PO TABS
50.0000 mg | ORAL_TABLET | Freq: Every day | ORAL | Status: DC
Start: 2016-03-09 — End: 2016-05-01

## 2016-03-09 NOTE — Assessment & Plan Note (Signed)
PHQ 9 - 11 in the office. Discussed pharmacotherapy and talk therapy and patient decided to light accommodation both - Start Zoloft 50 mg - Follow with behavioral health - Follow-up in 2 weeks for evaluation tolerance of Zoloft

## 2016-03-09 NOTE — Assessment & Plan Note (Signed)
Referral to podiatry for help with foot care

## 2016-03-09 NOTE — Progress Notes (Signed)
   Subjective:    Patient ID: Melanie Phillips, female    DOB: 1966/05/20, 50 y.o.   MRN: [id number]   CC: Wrist pain  HPI:  50 year old female presenting for right wrist pain  Right wrist pain - Chronic issue - Has been diagnosed with carpal tunnel - Uses a wrist brace at night - Feels the wrist brace helps some but still has pain - Denies weakness in her hands  PHQ 9  - elevated to 11 - denies SI/HI but reports feeling down that she feels helpless because of her chronic pain - Smokes 10 cigs per day partly to cope with this feeling   Review of Systems ROS  per history of present illness otherwise patient denies recent illnesses, fevers, chest pain, shortness of breath, weakness  Past Medical, Surgical, Social, and Family History Reviewed & Updated per EMR.   Objective:  BP 136/98 mmHg  Pulse 117  Temp(Src) 98.2 F (36.8 C) (Oral)  Wt 259 lb (117.482 kg) Vitals and nursing note reviewed  General: NAD Cardiac: RRR,  Respiratory: CTAB, normal effort Extremities: positive Phalen's on the right  Skin: warm and dry, no rashes noted    Assessment & Plan:    Depression PHQ 9 - 11 in the office. Discussed pharmacotherapy and talk therapy and patient decided to light accommodation both - Start Zoloft 50 mg - Follow with behavioral health - Follow-up in 2 weeks for evaluation tolerance of Zoloft  Carpal tunnel syndrome Continued chronic carpal tunnel syndrome symptoms despite splint use - Referred to sports medicine for discussion of further therapy  Diabetes mellitus, type 2 Referral to podiatry for help with foot care     Nonna Renninger A. Lincoln Brigham MD, [facility] Resident PGY-2 Pager [phone]

## 2016-03-09 NOTE — Assessment & Plan Note (Signed)
Continued chronic carpal tunnel syndrome symptoms despite splint use - Referred to sports medicine for discussion of further therapy

## 2016-03-09 NOTE — Patient Instructions (Signed)
Follow-up in 2 weeks for depression Make an appointment as soon as possible with [place] If you have questions or concerns call the office at [phone]

## 2016-03-17 ENCOUNTER — Ambulatory Visit: Payer: Self-pay

## 2016-03-19 ENCOUNTER — Other Ambulatory Visit: Payer: Self-pay | Admitting: Obstetrics and Gynecology

## 2016-03-21 ENCOUNTER — Other Ambulatory Visit: Payer: Self-pay | Admitting: Obstetrics and Gynecology

## 2016-03-23 ENCOUNTER — Encounter: Payer: Self-pay | Admitting: Sports Medicine

## 2016-03-23 ENCOUNTER — Ambulatory Visit (INDEPENDENT_AMBULATORY_CARE_PROVIDER_SITE_OTHER): Payer: Self-pay | Admitting: Sports Medicine

## 2016-03-23 VITALS — BP 172/97 | Ht 69.0 in | Wt 257.0 lb

## 2016-03-23 DIAGNOSIS — M25531 Pain in right wrist: Secondary | ICD-10-CM

## 2016-03-23 NOTE — Telephone Encounter (Signed)
Rx filled.  Algis Greenhouse. Jerline Pain, [place] Resident PGY-2 03/23/2016 12:19 PM

## 2016-03-24 NOTE — Progress Notes (Signed)
   Subjective:    Patient ID: Melanie Phillips, female    DOB: June 03, 1966, 50 y.o.   MRN: [id number]  HPI chief complaint: Right wrist pain  50 year old female comes in today complaining of 3-4 months of right wrist pain. No trauma. Pain is diffuse throughout the wrist. She is having pain across the dorsum of the wrist as well as around the thumb. She has not noticed any swelling. She denies numbness or tingling. She does have a history of carpal tunnel syndrome in the same wrist. She states that at times the wrist wants to "lock up". She has a cockup wrist brace that she has been wearing but it has not been beneficial. No prior surgeries to the hand or wrist. No pain more proximally in the neck or the elbow. No imaging of the hand or wrist has been done.  Past medical history reviewed Medications reviewed Allergies reviewed    Review of Systems    as above Objective:   Physical Exam  Well-developed, well-nourished. No acute distress. Sitting comfortably in the exam room  Right wrist: Full range of motion. No effusion. No soft tissue swelling. There is slight tenderness to palpation along the first extensor compartment with pain with Finkelstein's testing. Also pain with CMC grind. Negative Tinel's, negative Phalen's. No atrophy. No trigger finger. Good grip strength. Good radial ulnar pulses. Sensation is intact to light touch grossly.      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  Thumb spica splint to wear with activity. AP, lateral, and scaphoid views of the right wrist. Patient will follow-up with me in 3 weeks for reevaluation. At this point in time I do not think the patient has carpal tunnel syndrome but if her symptoms persist at follow-up, then we could consider a diagnostic cortisone injection into the first extensor compartment worse is sending the patient for an EMG/nerve conduction study.

## 2016-04-08 ENCOUNTER — Telehealth: Payer: Self-pay | Admitting: Obstetrics and Gynecology

## 2016-04-08 NOTE — Telephone Encounter (Signed)
Forms placed in my box for disability. Unsure what patient is requesting disability for. She has not been seen for any disability work-up. Please have her schedule an appointment to discuss with me. If based off our discussion and my evaluation I believe that disability is warranted I will fill out forms. Thanks!

## 2016-04-09 NOTE — Telephone Encounter (Signed)
Lm for pt to call back to schedule apt. Page, cma.

## 2016-04-10 NOTE — Telephone Encounter (Signed)
LM for patient to call back.  Will discuss this form with her when she does. Rykker Coviello,CMA

## 2016-04-10 NOTE — Telephone Encounter (Signed)
Patient informed, appointment scheduled. 

## 2016-04-13 ENCOUNTER — Ambulatory Visit
Admission: RE | Admit: 2016-04-13 | Discharge: 2016-04-13 | Disposition: A | Discharge status: Home / Self Care | Payer: No Typology Code available for payment source | Source: Ambulatory Visit | Attending: Sports Medicine | Admitting: Sports Medicine

## 2016-04-13 ENCOUNTER — Ambulatory Visit (INDEPENDENT_AMBULATORY_CARE_PROVIDER_SITE_OTHER): Payer: Self-pay | Admitting: Sports Medicine

## 2016-04-13 DIAGNOSIS — M25531 Pain in right wrist: Secondary | ICD-10-CM

## 2016-04-13 DIAGNOSIS — M19031 Primary osteoarthritis, right wrist: Secondary | ICD-10-CM

## 2016-04-13 DIAGNOSIS — G8929 Other chronic pain: Secondary | ICD-10-CM

## 2016-04-13 NOTE — Patient Instructions (Signed)
[facility] Dr Erlinda Hong Will have patient go by their office to make appt since they are having phone issue [address] [place], [place] [address] Phone: [phone]

## 2016-04-13 NOTE — Progress Notes (Signed)
   Subjective:    Patient ID: Melanie Phillips, female    DOB: 10-Dec-1965, 50 y.o.   MRN: [id number]  HPI   Patient comes in today for follow-up on chronic right wrist pain. Recent x-rays showed diffuse degenerative changes throughout the wrist. She continues to have pain throughout the wrist which radiates up into her forearm and elbow. When wearing the thumb spica brace, her pain does improve.    Review of Systems     Objective:   Physical Exam  Well-developed, well-nourished. No acute distress  Right wrist: Limited range of motion secondary to pain. No obvious soft tissue swelling. Diffuse tenderness to palpation. Good radial ulnar pulses.  X-rays are as above      Assessment & Plan:   Chronic right wrist pain with x-ray evidence of diffuse degenerative changes  Think the patient's wrist pain is multifactorial. She certainly has degenerative changes which are likely causing some of her symptoms. She may also have an element of carpal tunnel syndrome present. I recommended referral to Dr.Xu at [facility] for further workup and treatment. Patient will continue with her thumb spica brace in the meantime and will follow-up with me as needed.

## 2016-04-14 ENCOUNTER — Encounter: Payer: Self-pay | Admitting: Podiatry

## 2016-04-14 ENCOUNTER — Ambulatory Visit: Payer: No Typology Code available for payment source | Admitting: Podiatry

## 2016-04-14 VITALS — BP 127/80 | HR 103 | Resp 16

## 2016-04-14 DIAGNOSIS — M79609 Pain in unspecified limb: Principal | ICD-10-CM

## 2016-04-14 DIAGNOSIS — E119 Type 2 diabetes mellitus without complications: Secondary | ICD-10-CM

## 2016-04-14 DIAGNOSIS — M79673 Pain in unspecified foot: Secondary | ICD-10-CM

## 2016-04-14 DIAGNOSIS — B351 Tinea unguium: Secondary | ICD-10-CM

## 2016-04-14 NOTE — Progress Notes (Addendum)
   Subjective:    Patient ID: Melanie Phillips, female    DOB: [birth year], 50 y.o.   MRN: [id number]  HPI this patient presents to the office with chief complaint of painful long nails. Patient states the nails are painful walking and wearing her shoes. Patient is a diabetic type II under good control. She presents for to the office for evaluation of her feet and treatment of her painful long nails    Review of Systems  All other systems reviewed and are negative.      Objective:   Physical Exam GENERAL APPEARANCE: Alert, conversant. Appropriately groomed. No acute distress.  VASCULAR: Pedal pulses are  palpable at  [facility] and PT bilateral.  Capillary refill time is immediate to all digits,  Normal temperature gradient.  Digital hair growth is present bilateral  NEUROLOGIC: sensation is normal to 5.07 monofilament at 5/5 sites bilateral.  Light touch is intact bilateral, Muscle strength normal.  MUSCULOSKELETAL: acceptable muscle strength, tone and stability bilateral.  Intrinsic muscluature intact bilateral.  Rectus appearance of foot and digits noted bilateral.   DERMATOLOGIC: skin color, texture, and turgor are within normal limits.  No preulcerative lesions or ulcers  are seen, no interdigital maceration noted.  No open lesions present.  . No drainage noted.  NAILS  Thick disfigured discolored nails both feet.         Assessment & Plan:  Onychomycosis B/L  Diabetes with no foot complications.  IE  Debridement of nails.  RTC 3 months.   Gardiner Barefoot DPM

## 2016-04-17 ENCOUNTER — Ambulatory Visit: Payer: Self-pay | Admitting: Obstetrics and Gynecology

## 2016-04-30 ENCOUNTER — Ambulatory Visit (INDEPENDENT_AMBULATORY_CARE_PROVIDER_SITE_OTHER): Payer: No Typology Code available for payment source | Admitting: Family Medicine

## 2016-04-30 ENCOUNTER — Encounter: Payer: Self-pay | Admitting: Family Medicine

## 2016-04-30 VITALS — BP 165/92 | HR 100 | Temp 98.8°F

## 2016-04-30 DIAGNOSIS — G5601 Carpal tunnel syndrome, right upper limb: Secondary | ICD-10-CM

## 2016-04-30 NOTE — Patient Instructions (Addendum)
Come back and see Dr. Gerarda Fraction to get your pap smear and discuss colonoscopy, mammogram, diabetes. Schedule this on your way out.   Make sure Dr. Erlinda Hong sends the notes and results to our clinic, fax number [phone].

## 2016-04-30 NOTE — Progress Notes (Signed)
   Subjective:    Patient ID: Melanie Phillips, female    DOB: [birth year], 50 y.o.   MRN: [id number]  HPI  CC: paperwork  # Paperwork / carpal tunnel:  Was working as a Training and development officer, stopped working about 5 years ago.  Unable to hold anything in the right hand.   Has seen sports medicine and was referred to Dr. Erlinda Hong orthopedics, she has an appointment in 6/8 to get what sounds like nerve conduction studies done.   Is trying to get disability. She has been applying for disability in the past. Most recent appeal was denied in January (applied on basis of arthritis, hypertension, high cholesterol, carpal tunnel syndrome, diabetes, depression, degenerative disc disease, sciatica, tendonitis)  ROS: some numbness/tingling of fingers in hand, no redness or swelling, no fevers or chills  Social Hx: current smoker  Review of Systems   See HPI for ROS.   Past medical history, surgical, family, and social history reviewed and updated in the EMR as appropriate. Objective:  BP 165/92 mmHg  Pulse 100  Temp(Src) 98.8 F (37.1 C) (Oral)  SpO2 94% Vitals and nursing note reviewed  General: no apparent distress  MSK: right wrist: phalen's + tinel's positive on right, there is some tenderness of carpal tunnel and up the forearm toward the elbow. Grip strength appears symmetrical with the left.  CV: 2+ radial pulses bilaterally   Assessment & Plan:  Carpal tunnel syndrome She is being followed by sports medicine and was referred to orthopedics. It sounds like she is going to have nerve conduction studies done in June. I asked her to have orthopedics and testing results forwarded to the clinic. Patient did not bring in paperwork but said it was given to PCP while out on maternity leave; this is disability paperwork. I said I would not fill out paperwork today since I would like to see the results prior to filling out.    Return if symptoms worsen or fail to improve.

## 2016-05-01 ENCOUNTER — Other Ambulatory Visit: Payer: Self-pay | Admitting: Obstetrics and Gynecology

## 2016-05-01 MED ORDER — SERTRALINE HCL 50 MG PO TABS: 50.0000 mg | ORAL_TABLET | Freq: Every day | ORAL | Status: DC

## 2016-05-01 NOTE — Telephone Encounter (Signed)
Script was originally written to print so it was resent to pharmacy and patient informed. Melanie Phillips,CMA

## 2016-05-01 NOTE — Telephone Encounter (Signed)
Rx sent to pharmacy   

## 2016-05-01 NOTE — Telephone Encounter (Signed)
Patient states she lost RX for Zoloft. Requesting a new RX. Please advise.

## 2016-05-01 NOTE — Telephone Encounter (Signed)
Will forward to MD to see if he can write script early for patient. Emer Onnen,CMA

## 2016-05-01 NOTE — Addendum Note (Signed)
Addended by: Valerie Roys on: 05/01/2016 10:02 AM   Modules accepted: Orders

## 2016-05-05 NOTE — Assessment & Plan Note (Signed)
She is being followed by sports medicine and was referred to orthopedics. It sounds like she is going to have nerve conduction studies done in June. I asked her to have orthopedics and testing results forwarded to the clinic. Patient did not bring in paperwork but said it was given to PCP while out on maternity leave; this is disability paperwork. I said I would not fill out paperwork today since I would like to see the results prior to filling out.

## 2016-05-08 ENCOUNTER — Other Ambulatory Visit: Payer: Self-pay | Admitting: Obstetrics and Gynecology

## 2016-05-08 MED ORDER — SERTRALINE HCL 50 MG PO TABS: 50.0000 mg | ORAL_TABLET | Freq: Every day | ORAL | Status: DC

## 2016-05-08 NOTE — Telephone Encounter (Signed)
Pt called and would like [place] to send her Zoloft to the health dept since she has the orange card. Please let her know when we do this since she has to go there this afternoon and she could pick up everything at one time. jw

## 2016-05-11 ENCOUNTER — Telehealth: Payer: Self-pay | Admitting: Psychology

## 2016-05-11 NOTE — Telephone Encounter (Signed)
Patient was scheduled for IC clinic but no [facility] available.  Called to discuss (couldn't quite figure out why she was referred).  She states trying to get disability and depression is part of it.  Prescribed Zoloft and PCP recommended she see someone (not sure how long ago).  Rescheduled her for 6/23 at 10:00 to see Elton Sin.

## 2016-05-12 ENCOUNTER — Other Ambulatory Visit: Payer: Self-pay | Admitting: *Deleted

## 2016-05-12 ENCOUNTER — Other Ambulatory Visit: Payer: Self-pay | Admitting: Obstetrics and Gynecology

## 2016-05-12 ENCOUNTER — Ambulatory Visit: Payer: Self-pay

## 2016-05-12 MED ORDER — CYCLOBENZAPRINE HCL 10 MG PO TABS: ORAL_TABLET | ORAL | Status: DC

## 2016-05-22 ENCOUNTER — Ambulatory Visit: Payer: Self-pay

## 2016-05-22 NOTE — Progress Notes (Deleted)
Reason for follow-up:  ***  Issues discussed:  ***  Identified goals:  ***

## 2016-05-26 ENCOUNTER — Encounter: Payer: No Typology Code available for payment source | Admitting: Obstetrics and Gynecology

## 2016-05-26 ENCOUNTER — Ambulatory Visit: Payer: Self-pay

## 2016-06-12 DIAGNOSIS — B351 Tinea unguium: Secondary | ICD-10-CM

## 2016-06-23 ENCOUNTER — Ambulatory Visit: Payer: No Typology Code available for payment source

## 2016-07-21 ENCOUNTER — Ambulatory Visit: Payer: No Typology Code available for payment source | Admitting: Podiatry

## 2016-11-03 ENCOUNTER — Emergency Department (HOSPITAL_COMMUNITY)
Admission: EM | Admit: 2016-11-03 | Discharge: 2016-11-03 | Disposition: A | Discharge status: Home / Self Care | Payer: No Typology Code available for payment source | Attending: Emergency Medicine | Admitting: Emergency Medicine

## 2016-11-03 ENCOUNTER — Emergency Department (HOSPITAL_COMMUNITY): Payer: No Typology Code available for payment source

## 2016-11-03 ENCOUNTER — Encounter (HOSPITAL_COMMUNITY): Payer: Self-pay

## 2016-11-03 DIAGNOSIS — Y939 Activity, unspecified: Secondary | ICD-10-CM | POA: Insufficient documentation

## 2016-11-03 DIAGNOSIS — S6991XA Unspecified injury of right wrist, hand and finger(s), initial encounter: Secondary | ICD-10-CM | POA: Diagnosis present

## 2016-11-03 DIAGNOSIS — E119 Type 2 diabetes mellitus without complications: Secondary | ICD-10-CM | POA: Insufficient documentation

## 2016-11-03 DIAGNOSIS — S62340A Nondisplaced fracture of base of second metacarpal bone, right hand, initial encounter for closed fracture: Secondary | ICD-10-CM | POA: Diagnosis not present

## 2016-11-03 DIAGNOSIS — Z7982 Long term (current) use of aspirin: Secondary | ICD-10-CM | POA: Diagnosis not present

## 2016-11-03 DIAGNOSIS — Z79899 Other long term (current) drug therapy: Secondary | ICD-10-CM | POA: Insufficient documentation

## 2016-11-03 DIAGNOSIS — F17210 Nicotine dependence, cigarettes, uncomplicated: Secondary | ICD-10-CM | POA: Insufficient documentation

## 2016-11-03 DIAGNOSIS — I10 Essential (primary) hypertension: Secondary | ICD-10-CM | POA: Insufficient documentation

## 2016-11-03 DIAGNOSIS — Y999 Unspecified external cause status: Secondary | ICD-10-CM | POA: Diagnosis not present

## 2016-11-03 DIAGNOSIS — Y92411 Interstate highway as the place of occurrence of the external cause: Secondary | ICD-10-CM | POA: Diagnosis not present

## 2016-11-03 DIAGNOSIS — Z7984 Long term (current) use of oral hypoglycemic drugs: Secondary | ICD-10-CM | POA: Diagnosis not present

## 2016-11-03 LAB — POC URINE PREG, ED: Preg Test, Ur: NEGATIVE

## 2016-11-03 MED ORDER — CYCLOBENZAPRINE HCL 10 MG PO TABS: 10.0000 mg | ORAL_TABLET | Freq: Two times a day (BID) | ORAL | 0 refills | Status: DC | PRN

## 2016-11-03 MED ORDER — HYDROCODONE-ACETAMINOPHEN 5-325 MG PO TABS: 1.0000 | ORAL_TABLET | ORAL | 0 refills | Status: DC | PRN

## 2016-11-03 MED ORDER — HYDROCODONE-ACETAMINOPHEN 5-325 MG PO TABS
1.0000 | ORAL_TABLET | Freq: Once | ORAL | Status: AC
Start: 2016-11-03 — End: 2016-11-03
  Administered 2016-11-03: 1 via ORAL
  Filled 2016-11-03: qty 1

## 2016-11-03 MED ORDER — IBUPROFEN 600 MG PO TABS: 600.0000 mg | ORAL_TABLET | Freq: Four times a day (QID) | ORAL | 0 refills | Status: DC | PRN

## 2016-11-03 NOTE — ED Notes (Signed)
Patient Alert and oriented X4. Stable and ambulatory. Patient verbalized understanding of the discharge instructions.  Patient belongings were taken by the patient.  
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
## 2016-11-03 NOTE — ED Notes (Signed)
Patient is A&Ox4 at this time.  Patient in no signs of distress.  Please see providers note for complete history and physical exam.  

## 2016-11-03 NOTE — Discharge Instructions (Signed)
Take your medications as prescribed. I also recommend applying ice and/or heat to affected area for 15-20 minutes 3-4 times daily for additional pain relief. Refrain from doing any heavy lifting, squatting or repetitive movements that exacerbate your symptoms. Continue wearing your arm splint and to the follow-up with orthopedics. I recommend resting, elevating and applying ice to her right hand. Please follow up with a primary care provider from the Resource Guide provided below in one week if her symptoms have not improved. I recommend calling your orthopedics office to schedule appointment for reevaluation in the next 1-2 weeks for further management of your right second  metacarpal fracture. Please return to the Emergency Department if symptoms worsen or new onset of fever, numbness, tingling, groin anesthesia, loss of bowel or bladder, weakness.
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
## 2016-11-03 NOTE — ED Notes (Signed)
Ortho called to come apply splint

## 2016-11-16 ENCOUNTER — Ambulatory Visit (INDEPENDENT_AMBULATORY_CARE_PROVIDER_SITE_OTHER): Payer: Self-pay | Admitting: Orthopaedic Surgery

## 2016-11-16 ENCOUNTER — Encounter (INDEPENDENT_AMBULATORY_CARE_PROVIDER_SITE_OTHER): Payer: Self-pay | Admitting: Orthopaedic Surgery

## 2016-11-16 DIAGNOSIS — M79641 Pain in right hand: Secondary | ICD-10-CM

## 2016-11-16 MED ORDER — HYDROCODONE-ACETAMINOPHEN 5-325 MG PO TABS: 1.0000 | ORAL_TABLET | Freq: Two times a day (BID) | ORAL | 0 refills | Status: DC | PRN
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
## 2016-12-08 ENCOUNTER — Other Ambulatory Visit: Payer: Self-pay | Admitting: Obstetrics and Gynecology

## 2016-12-23 ENCOUNTER — Ambulatory Visit: Payer: No Typology Code available for payment source

## 2017-01-12 ENCOUNTER — Other Ambulatory Visit: Payer: Self-pay | Admitting: Obstetrics and Gynecology

## 2017-01-12 NOTE — Telephone Encounter (Signed)
Pt needs a refill on Metformin. Pt uses [facility] on [address]. ep

## 2017-02-15 ENCOUNTER — Other Ambulatory Visit: Payer: Self-pay | Admitting: Obstetrics and Gynecology

## 2017-02-16 NOTE — Telephone Encounter (Signed)
Pt called again about refill on her metformin

## 2017-02-17 NOTE — Telephone Encounter (Signed)
Metformin refill needs to go to [facility] on [address]. Pt doesn't have an orange card so she cannot get it thru the [facility].  She says she has been without her medicine for 3 days

## 2017-02-17 NOTE — Telephone Encounter (Signed)
Approved but patient needs to be seen for further refills. She only received 30d supply. This was written on medication.

## 2017-02-17 NOTE — Telephone Encounter (Signed)
Pt contacted and she has an apt with jackie for orange card approval next week. Once pt is approved she will schedule an apt to be seen.

## 2017-02-22 ENCOUNTER — Ambulatory Visit: Payer: No Typology Code available for payment source

## 2017-02-23 ENCOUNTER — Other Ambulatory Visit (INDEPENDENT_AMBULATORY_CARE_PROVIDER_SITE_OTHER): Payer: Self-pay | Admitting: Orthopaedic Surgery

## 2017-02-24 ENCOUNTER — Other Ambulatory Visit: Payer: Self-pay | Admitting: *Deleted

## 2017-02-25 ENCOUNTER — Ambulatory Visit: Payer: No Typology Code available for payment source | Admitting: Obstetrics and Gynecology

## 2017-02-25 MED ORDER — LISINOPRIL 5 MG PO TABS: 5.0000 mg | ORAL_TABLET | Freq: Every day | ORAL | 0 refills | Status: DC

## 2017-02-25 MED ORDER — METFORMIN HCL 1000 MG PO TABS: 1000.0000 mg | ORAL_TABLET | Freq: Two times a day (BID) | ORAL | 0 refills | Status: DC

## 2017-02-25 MED ORDER — ATORVASTATIN CALCIUM 40 MG PO TABS: 40.0000 mg | ORAL_TABLET | Freq: Every day | ORAL | 0 refills | Status: DC

## 2017-02-25 MED ORDER — OMEPRAZOLE 40 MG PO CPDR: 40.0000 mg | DELAYED_RELEASE_CAPSULE | Freq: Every day | ORAL | 0 refills | Status: DC

## 2017-03-03 ENCOUNTER — Encounter: Payer: Self-pay | Admitting: Obstetrics and Gynecology

## 2017-03-03 ENCOUNTER — Other Ambulatory Visit (HOSPITAL_COMMUNITY)
Admission: RE | Admit: 2017-03-03 | Discharge: 2017-03-03 | Disposition: A | Discharge status: Home / Self Care | Payer: No Typology Code available for payment source | Source: Ambulatory Visit | Attending: Family Medicine | Admitting: Family Medicine

## 2017-03-03 ENCOUNTER — Other Ambulatory Visit (INDEPENDENT_AMBULATORY_CARE_PROVIDER_SITE_OTHER): Payer: Self-pay | Admitting: Orthopaedic Surgery

## 2017-03-03 ENCOUNTER — Ambulatory Visit (INDEPENDENT_AMBULATORY_CARE_PROVIDER_SITE_OTHER): Payer: No Typology Code available for payment source | Admitting: Obstetrics and Gynecology

## 2017-03-03 VITALS — BP 140/88 | HR 91 | Temp 98.2°F | Ht 70.0 in | Wt 243.0 lb

## 2017-03-03 DIAGNOSIS — M19031 Primary osteoarthritis, right wrist: Secondary | ICD-10-CM

## 2017-03-03 DIAGNOSIS — Z124 Encounter for screening for malignant neoplasm of cervix: Secondary | ICD-10-CM

## 2017-03-03 DIAGNOSIS — E119 Type 2 diabetes mellitus without complications: Secondary | ICD-10-CM

## 2017-03-03 DIAGNOSIS — E1310 Other specified diabetes mellitus with ketoacidosis without coma: Secondary | ICD-10-CM

## 2017-03-03 DIAGNOSIS — I10 Essential (primary) hypertension: Secondary | ICD-10-CM

## 2017-03-03 DIAGNOSIS — Z0000 Encounter for general adult medical examination without abnormal findings: Secondary | ICD-10-CM

## 2017-03-03 DIAGNOSIS — Z1159 Encounter for screening for other viral diseases: Secondary | ICD-10-CM

## 2017-03-03 DIAGNOSIS — G5601 Carpal tunnel syndrome, right upper limb: Secondary | ICD-10-CM

## 2017-03-03 DIAGNOSIS — Z114 Encounter for screening for human immunodeficiency virus [HIV]: Secondary | ICD-10-CM

## 2017-03-03 DIAGNOSIS — F329 Major depressive disorder, single episode, unspecified: Secondary | ICD-10-CM

## 2017-03-03 DIAGNOSIS — F32A Depression, unspecified: Secondary | ICD-10-CM

## 2017-03-03 DIAGNOSIS — N760 Acute vaginitis: Secondary | ICD-10-CM | POA: Insufficient documentation

## 2017-03-03 LAB — POCT GLYCOSYLATED HEMOGLOBIN (HGB A1C): Hemoglobin A1C: 6.3

## 2017-03-03 MED ORDER — SERTRALINE HCL 50 MG PO TABS: 50.0000 mg | ORAL_TABLET | Freq: Every day | ORAL | 2 refills | Status: DC

## 2017-03-03 MED ORDER — HYDROCODONE-ACETAMINOPHEN 5-325 MG PO TABS: 1.0000 | ORAL_TABLET | Freq: Three times a day (TID) | ORAL | 0 refills | Status: AC | PRN

## 2017-03-03 NOTE — Patient Instructions (Signed)
Things to do to Keep yourself Healthy - Exercise at least 30-45 minutes a day,  3-4 days a week.  - Eat a low-fat diet with lots of fruits and vegetables, up to 7-9 servings per day. - Seatbelts can save your life. Wear them always. - Smoke detectors on every level of your home, check batteries every year. - Eye Doctor - have an eye exam every 1-2 years - Safe sex - if you may be exposed to STDs, use a condom. - Alcohol If you drink, do it moderately,less than 2 drinks per day. - Health Care Power of Attorney.  Choose someone to speak for you if you are not able. - Depression is common in our stressful world.If you're feeling down or losing interest in things you normally enjoy, please come in for a visit. - Violence - If anyone is threatening or hurting you, please call immediately.  

## 2017-03-03 NOTE — Progress Notes (Signed)
Subjective: Chief Complaint  Patient presents with  . Diabetes  . Gynecologic Exam     HPI: Melanie Phillips is a 51 y.o. presenting to clinic today for well woman/preventative visit and annual GYN examination.  Acute Concerns:  #Diabetes. States her sugars have been running in the low 100s fasting.   #Right hand pain. Has surgery next week on hand. Pain is chronic. Rates it an 8/10. No longer has anymore pain medication. Was taking Norco given to her y ortho.   #Medications. Has been out of all medications so was not taking anything. She just recently got her orange card so is able to pick up her medications. Needs refills on everything.   #HTN. Not taking lisinopril because did not have money to afford.   Diet: improving diet  Exercise: no, but tries to stay active  Sexual History: sexually active with partner of 20yrs Birth history: Has 6 children  LMP: Just had some light bleeding last week. Prior to that almost a year without a period. Perimenopausal.   Birth Control: Tubal ligation  Past Medical History:  Diagnosis Date  . Arthritis   . Back pain   . Diabetes mellitus without complication ([place])   . Hyperlipidemia   . Hypertension     Social:  Social History   Social History  . Marital status: Single    Spouse name: N/A  . Number of children: N/A  . Years of education: N/A   Social History Main Topics  . Smoking status: Current Every Day Smoker    Packs/day: 0.25    Types: Cigarettes  . Smokeless tobacco: Never Used     Comment: using electronic cigs with regular cigs  . Alcohol use No  . Drug use: No  . Sexual activity: Not on file   Other Topics Concern  . Not on file   Social History Narrative  . No narrative on file    Health Maintenance Due  Topic Date Due  . Hepatitis C Screening  03-03-1966  . HIV Screening  11/03/1979  . TETANUS/TDAP  11/03/1983  . PAP SMEAR  08/06/2013  . MAMMOGRAM  11/02/2014  . COLONOSCOPY  11/02/2014  .  FOOT EXAM  08/16/2015  . OPHTHALMOLOGY EXAM  09/11/2015  . HEMOGLOBIN A1C  08/19/2016    Objective: BP 140/88   Pulse 91   Temp 98.2 F (36.8 C) (Oral)   Ht 5\' 10"  (1.778 m)   Wt 243 lb (110.2 kg)   SpO2 98%   BMI 34.87 kg/m  Vitals and nursing notes reviewed  Physical Exam  Constitutional: She is oriented to person, place, and time and well-developed, well-nourished, and in no distress.  HENT:  Head: Normocephalic and atraumatic.  Mouth/Throat: Oropharynx is clear and moist.  Eyes: Conjunctivae and EOM are normal. Pupils are equal, round, and reactive to light.  Neck: Normal range of motion. Neck supple.  Cardiovascular: Normal rate, regular rhythm, normal heart sounds and intact distal pulses.   Pulmonary/Chest: Effort normal and breath sounds normal.  Breasts: bilateral breasts normal in appearance. No erythema, deformity, or nipple discharge. No palpable abnormal masses. No axillary lymphadenopathy.  Abdominal: Soft. Bowel sounds are normal. She exhibits no distension. There is no tenderness.  Genitourinary:  Genitourinary Comments: Genitalia:  Normal introitus for age, no external lesions, no vaginal discharge, mucosa pink and moist, no vaginal or cervical lesions, no vaginal atrophy, no friaility or hemorrhage, normal uterus size and position, no adnexal masses or tenderness  Musculoskeletal: Normal range of motion. She exhibits no edema.  Neurological: She is alert and oriented to person, place, and time.  No focal deficits appreciated  Skin: Skin is warm and dry.  Psychiatric: Mood and affect normal.    Results for orders placed or performed in visit on 03/03/17 (from the past 72 hour(s))  HgB A1c     Status: None   Collection Time: 03/03/17 11:23 AM  Result Value Ref Range   Hemoglobin A1C 6.3     Assessment/Plan: Please see problem based Assessment and Plan PATIENT EDUCATION PROVIDED: See AVS    Diagnosis and plan along with any newly prescribed medication(s)  were discussed in detail with this patient today. The patient verbalized understanding and agreed with the plan.     Orders Placed This Encounter  Procedures  . MM DIGITAL SCREENING BILATERAL    Standing Status:   Future    Standing Expiration Date:   05/03/2018    Order Specific Question:   Reason for Exam (SYMPTOM  OR DIAGNOSIS REQUIRED)    Answer:   screening mammo    Order Specific Question:   Is the patient pregnant?    Answer:   No    Order Specific Question:   Preferred imaging location?    Answer:   [facility]  . HIV antibody (with reflex)  . Hepatitis C Antibody  . RPR  . Ambulatory referral to Gastroenterology    Referral Priority:   Routine    Referral Type:   Consultation    Referral Reason:   Specialty Services Required    Number of Visits Requested:   1  . HgB A1c    Meds ordered this encounter  Medications  . sertraline (ZOLOFT) 50 MG tablet    Sig: Take 1 tablet (50 mg total) by mouth daily.    Dispense:  30 tablet    Refill:  2  . HYDROcodone-acetaminophen (NORCO) 5-325 MG tablet    Sig: Take 1 tablet by mouth 3 (three) times daily as needed.    Dispense:  21 tablet    Refill:  [place], DO 03/03/2017, 11:23 AM PGY-3, [place] [facility]

## 2017-03-04 ENCOUNTER — Encounter: Payer: Self-pay | Admitting: Obstetrics and Gynecology

## 2017-03-04 ENCOUNTER — Encounter (HOSPITAL_BASED_OUTPATIENT_CLINIC_OR_DEPARTMENT_OTHER): Payer: Self-pay | Admitting: *Deleted

## 2017-03-04 DIAGNOSIS — I10 Essential (primary) hypertension: Secondary | ICD-10-CM | POA: Insufficient documentation

## 2017-03-04 DIAGNOSIS — Z0000 Encounter for general adult medical examination without abnormal findings: Secondary | ICD-10-CM | POA: Insufficient documentation

## 2017-03-04 LAB — CERVICOVAGINAL ANCILLARY ONLY
Bacterial vaginitis: POSITIVE — AB
CHLAMYDIA, DNA PROBE: NEGATIVE
NEISSERIA GONORRHEA: NEGATIVE
Trichomonas: NEGATIVE

## 2017-03-04 LAB — HEPATITIS C ANTIBODY: Hep C Virus Ab: <0.1 s/co ratio (ref 0.0–0.9)

## 2017-03-04 LAB — RPR: RPR Ser Ql: NONREACTIVE

## 2017-03-04 LAB — HIV ANTIBODY (ROUTINE TESTING W REFLEX): HIV Screen 4th Generation wRfx: NONREACTIVE

## 2017-03-04 NOTE — Progress Notes (Signed)
Bring all medications. Pt. Is coming Monday or Tuesday for BMET and EKG.

## 2017-03-04 NOTE — Assessment & Plan Note (Addendum)
Annual exam today. Patient overall well-appearing. Ordered placed for routine screening of HIV, RPR, and Hep C. Placed referral for mammogram and colonoscopy. Handout given on ways to stay healthy and preventative measures.

## 2017-03-04 NOTE — Assessment & Plan Note (Signed)
Well-controlled despite patient not having medications in about a month. A1c today 6.3 which is down from prior. Continue metformin therapy, ASA, and statin.

## 2017-03-04 NOTE — Assessment & Plan Note (Signed)
Mildly elevated today. Patient has been without medications. Refill of lisinopril sent to pharmacy.

## 2017-03-04 NOTE — Assessment & Plan Note (Signed)
Patient would like refill of Zoloft she feels it helped with her depressive symptoms. Follow-up new PHQ-9 at next visit.

## 2017-03-04 NOTE — Assessment & Plan Note (Signed)
Followed by orthopedics. Has surgery planned on 03/10/17 to include right thumb ligament reconstruction, Tendon Interposition, Right Carpal tunnel release. Rx given for Norco #21 to get her to surgery.

## 2017-03-08 ENCOUNTER — Other Ambulatory Visit: Payer: Self-pay | Admitting: Obstetrics and Gynecology

## 2017-03-08 LAB — CYTOLOGY - PAP
Adequacy: ABSENT
Diagnosis: NEGATIVE
HPV (WINDOPATH): NOT DETECTED

## 2017-03-08 MED ORDER — METRONIDAZOLE 500 MG PO TABS: 500.0000 mg | ORAL_TABLET | Freq: Two times a day (BID) | ORAL | 0 refills | Status: DC

## 2017-03-10 ENCOUNTER — Encounter (HOSPITAL_BASED_OUTPATIENT_CLINIC_OR_DEPARTMENT_OTHER): Payer: Self-pay | Admitting: *Deleted

## 2017-03-10 ENCOUNTER — Other Ambulatory Visit: Payer: Self-pay | Admitting: Obstetrics and Gynecology

## 2017-03-10 ENCOUNTER — Ambulatory Visit (HOSPITAL_BASED_OUTPATIENT_CLINIC_OR_DEPARTMENT_OTHER): Payer: Self-pay | Admitting: Certified Registered"

## 2017-03-10 ENCOUNTER — Ambulatory Visit (HOSPITAL_BASED_OUTPATIENT_CLINIC_OR_DEPARTMENT_OTHER)
Admission: RE | Admit: 2017-03-10 | Discharge: 2017-03-10 | Disposition: A | Discharge status: Home / Self Care | Payer: Self-pay | Source: Ambulatory Visit | Attending: Orthopaedic Surgery | Admitting: Orthopaedic Surgery

## 2017-03-10 ENCOUNTER — Encounter (HOSPITAL_BASED_OUTPATIENT_CLINIC_OR_DEPARTMENT_OTHER)
Admission: RE | Disposition: A | Discharge status: Home / Self Care | Payer: Self-pay | Source: Ambulatory Visit | Attending: Orthopaedic Surgery

## 2017-03-10 DIAGNOSIS — F329 Major depressive disorder, single episode, unspecified: Secondary | ICD-10-CM | POA: Insufficient documentation

## 2017-03-10 DIAGNOSIS — G5601 Carpal tunnel syndrome, right upper limb: Secondary | ICD-10-CM

## 2017-03-10 DIAGNOSIS — I10 Essential (primary) hypertension: Secondary | ICD-10-CM | POA: Insufficient documentation

## 2017-03-10 DIAGNOSIS — Z1231 Encounter for screening mammogram for malignant neoplasm of breast: Secondary | ICD-10-CM

## 2017-03-10 DIAGNOSIS — M19031 Primary osteoarthritis, right wrist: Secondary | ICD-10-CM

## 2017-03-10 DIAGNOSIS — E119 Type 2 diabetes mellitus without complications: Secondary | ICD-10-CM | POA: Insufficient documentation

## 2017-03-10 DIAGNOSIS — E785 Hyperlipidemia, unspecified: Secondary | ICD-10-CM | POA: Insufficient documentation

## 2017-03-10 DIAGNOSIS — Z7984 Long term (current) use of oral hypoglycemic drugs: Secondary | ICD-10-CM | POA: Insufficient documentation

## 2017-03-10 DIAGNOSIS — M19041 Primary osteoarthritis, right hand: Secondary | ICD-10-CM | POA: Insufficient documentation

## 2017-03-10 DIAGNOSIS — Z7982 Long term (current) use of aspirin: Secondary | ICD-10-CM | POA: Insufficient documentation

## 2017-03-10 DIAGNOSIS — F17210 Nicotine dependence, cigarettes, uncomplicated: Secondary | ICD-10-CM | POA: Insufficient documentation

## 2017-03-10 DIAGNOSIS — M1811 Unilateral primary osteoarthritis of first carpometacarpal joint, right hand: Secondary | ICD-10-CM

## 2017-03-10 DIAGNOSIS — Z79899 Other long term (current) drug therapy: Secondary | ICD-10-CM | POA: Insufficient documentation

## 2017-03-10 HISTORY — PX: CARPAL TUNNEL RELEASE: SHX101

## 2017-03-10 HISTORY — PX: CARPOMETACARPEL SUSPENSION PLASTY: SHX5005

## 2017-03-10 HISTORY — DX: Depression, unspecified: F32.A

## 2017-03-10 HISTORY — DX: Major depressive disorder, single episode, unspecified: F32.9

## 2017-03-10 LAB — POCT I-STAT, CHEM 8
BUN: 7 mg/dL (ref 6–20)
CHLORIDE: 104 mmol/L (ref 101–111)
Calcium, Ion: 1.09 mmol/L — ABNORMAL LOW (ref 1.15–1.40)
Creatinine, Ser: 0.6 mg/dL (ref 0.44–1.00)
Glucose, Bld: 115 mg/dL — ABNORMAL HIGH (ref 65–99)
HEMATOCRIT: 45 % (ref 36.0–46.0)
Hemoglobin: 15.3 g/dL — ABNORMAL HIGH (ref 12.0–15.0)
Potassium: 4.1 mmol/L (ref 3.5–5.1)
SODIUM: 140 mmol/L (ref 135–145)
TCO2: 30 mmol/L (ref 0–100)

## 2017-03-10 LAB — GLUCOSE, CAPILLARY: GLUCOSE-CAPILLARY: 126 mg/dL — AB (ref 65–99)

## 2017-03-10 SURGERY — CARPOMETACARPEL (CMC) SUSPENSION PLASTY
Anesthesia: Monitor Anesthesia Care | Site: Arm Lower | Laterality: Right

## 2017-03-10 MED ORDER — PROPOFOL 500 MG/50ML IV EMUL
INTRAVENOUS | Status: DC | PRN
  Administered 2017-03-10: 200 ug/kg/min via INTRAVENOUS
  Administered 2017-03-10: 150 ug/kg/min via INTRAVENOUS

## 2017-03-10 MED ORDER — MEPERIDINE HCL 25 MG/ML IJ SOLN: 6.2500 mg | INTRAMUSCULAR | Status: DC | PRN

## 2017-03-10 MED ORDER — HYDROMORPHONE HCL 1 MG/ML IJ SOLN: 0.2500 mg | INTRAMUSCULAR | Status: DC | PRN

## 2017-03-10 MED ORDER — CEFAZOLIN SODIUM-DEXTROSE 2-4 GM/100ML-% IV SOLN
2.0000 g | INTRAVENOUS | Status: AC
  Administered 2017-03-10: 2 g via INTRAVENOUS

## 2017-03-10 MED ORDER — PROPOFOL 10 MG/ML IV BOLUS
INTRAVENOUS | Status: DC | PRN
  Administered 2017-03-10: 40 mg via INTRAVENOUS
  Administered 2017-03-10: 50 mg via INTRAVENOUS

## 2017-03-10 MED ORDER — LIDOCAINE HCL (CARDIAC) 20 MG/ML IV SOLN
INTRAVENOUS | Status: DC | PRN
  Administered 2017-03-10: 50 mg via INTRAVENOUS

## 2017-03-10 MED ORDER — SCOPOLAMINE 1 MG/3DAYS TD PT72: 1.0000 | MEDICATED_PATCH | Freq: Once | TRANSDERMAL | Status: DC | PRN

## 2017-03-10 MED ORDER — ONDANSETRON HCL 4 MG/2ML IJ SOLN
INTRAMUSCULAR | Status: DC | PRN
  Administered 2017-03-10: 4 mg via INTRAVENOUS

## 2017-03-10 MED ORDER — ONDANSETRON HCL 4 MG/2ML IJ SOLN
INTRAMUSCULAR | Status: AC
  Filled 2017-03-10: qty 2

## 2017-03-10 MED ORDER — MIDAZOLAM HCL 2 MG/2ML IJ SOLN
1.0000 mg | INTRAMUSCULAR | Status: DC | PRN
  Administered 2017-03-10 (×2): 2 mg via INTRAVENOUS

## 2017-03-10 MED ORDER — MIDAZOLAM HCL 2 MG/2ML IJ SOLN
INTRAMUSCULAR | Status: AC
  Filled 2017-03-10: qty 2

## 2017-03-10 MED ORDER — HYDROCODONE-ACETAMINOPHEN 7.5-325 MG PO TABS: 1.0000 | ORAL_TABLET | Freq: Four times a day (QID) | ORAL | 0 refills | Status: DC | PRN

## 2017-03-10 MED ORDER — ONDANSETRON HCL 4 MG/2ML IJ SOLN: 4.0000 mg | Freq: Once | INTRAMUSCULAR | Status: DC | PRN

## 2017-03-10 MED ORDER — LACTATED RINGERS IV SOLN
INTRAVENOUS | Status: DC
  Administered 2017-03-10: 09:00:00 via INTRAVENOUS

## 2017-03-10 MED ORDER — CEFAZOLIN SODIUM-DEXTROSE 2-4 GM/100ML-% IV SOLN
INTRAVENOUS | Status: AC
  Filled 2017-03-10: qty 100

## 2017-03-10 MED ORDER — FENTANYL CITRATE (PF) 100 MCG/2ML IJ SOLN
50.0000 ug | INTRAMUSCULAR | Status: DC | PRN
  Administered 2017-03-10 (×2): 100 ug via INTRAVENOUS

## 2017-03-10 MED ORDER — FENTANYL CITRATE (PF) 100 MCG/2ML IJ SOLN
INTRAMUSCULAR | Status: AC
  Filled 2017-03-10: qty 2

## 2017-03-10 MED ORDER — BUPIVACAINE-EPINEPHRINE (PF) 0.5% -1:200000 IJ SOLN
INTRAMUSCULAR | Status: DC | PRN
  Administered 2017-03-10: 30 mL via PERINEURAL

## 2017-03-10 SURGICAL SUPPLY — 75 items
BANDAGE ACE 3X5.8 VEL STRL LF (GAUZE/BANDAGES/DRESSINGS) ×3 IMPLANT
BLADE MINI RND TIP GREEN BEAV (BLADE) ×3 IMPLANT
BLADE SURG 15 STRL LF DISP TIS (BLADE) ×2 IMPLANT
BLADE SURG 15 STRL SS (BLADE) ×6
BNDG CMPR 9X4 STRL LF SNTH (GAUZE/BANDAGES/DRESSINGS) ×1
BNDG ESMARK 4X9 LF (GAUZE/BANDAGES/DRESSINGS) ×3 IMPLANT
BNDG PLASTER X FAST 3X3 WHT LF (CAST SUPPLIES) ×30 IMPLANT
BNDG PLSTR 9X3 FST ST WHT (CAST SUPPLIES) ×10
BRUSH SCRUB EZ PLAIN DRY (MISCELLANEOUS) ×3 IMPLANT
CORDS BIPOLAR (ELECTRODE) ×3 IMPLANT
COVER BACK TABLE 60X90IN (DRAPES) ×3 IMPLANT
COVER MAYO STAND STRL (DRAPES) ×3 IMPLANT
CUFF TOURNIQUET SINGLE 18IN (TOURNIQUET CUFF) IMPLANT
DECANTER SPIKE VIAL GLASS SM (MISCELLANEOUS) IMPLANT
DRAPE EXTREMITY T 121X128X90 (DRAPE) ×3 IMPLANT
DRAPE IMP U-DRAPE 54X76 (DRAPES) ×3 IMPLANT
DRAPE OEC MINIVIEW 54X84 (DRAPES) ×3 IMPLANT
DRAPE SURG 17X23 STRL (DRAPES) ×3 IMPLANT
GAUZE SPONGE 4X4 12PLY STRL (GAUZE/BANDAGES/DRESSINGS) ×3 IMPLANT
GAUZE SPONGE 4X4 16PLY XRAY LF (GAUZE/BANDAGES/DRESSINGS) IMPLANT
GAUZE XEROFORM 1X8 LF (GAUZE/BANDAGES/DRESSINGS) ×3 IMPLANT
GLOVE BIO SURGEON STRL SZ 6.5 (GLOVE) ×1 IMPLANT
GLOVE BIO SURGEONS STRL SZ 6.5 (GLOVE) ×1
GLOVE BIOGEL PI IND STRL 7.0 (GLOVE) IMPLANT
GLOVE BIOGEL PI INDICATOR 7.0 (GLOVE) ×4
GLOVE SKINSENSE NS SZ7.5 (GLOVE) ×2
GLOVE SKINSENSE STRL SZ7.5 (GLOVE) ×1 IMPLANT
GLOVE SURG SYN 7.5  E (GLOVE) ×4
GLOVE SURG SYN 7.5 E (GLOVE) ×2 IMPLANT
GLOVE SURG SYN 7.5 PF PI (GLOVE) ×2 IMPLANT
GOWN PREVENTION PLUS XLARGE (GOWN DISPOSABLE) ×3 IMPLANT
GOWN STRL REIN XL XLG (GOWN DISPOSABLE) ×3 IMPLANT
GOWN STRL REUS W/ TWL LRG LVL3 (GOWN DISPOSABLE) ×1 IMPLANT
GOWN STRL REUS W/TWL LRG LVL3 (GOWN DISPOSABLE) ×3
NDL HYPO 25X1 1.5 SAFETY (NEEDLE) IMPLANT
NEEDLE HYPO 22GX1.5 SAFETY (NEEDLE) IMPLANT
NEEDLE HYPO 25X1 1.5 SAFETY (NEEDLE) IMPLANT
NS IRRIG 1000ML POUR BTL (IV SOLUTION) ×3 IMPLANT
PACK BASIN DAY SURGERY FS (CUSTOM PROCEDURE TRAY) ×3 IMPLANT
PAD CAST 3X4 CTTN HI CHSV (CAST SUPPLIES) ×1 IMPLANT
PADDING CAST ABS 3INX4YD NS (CAST SUPPLIES)
PADDING CAST ABS 4INX4YD NS (CAST SUPPLIES) ×2
PADDING CAST ABS COTTON 3X4 (CAST SUPPLIES) IMPLANT
PADDING CAST ABS COTTON 4X4 ST (CAST SUPPLIES) ×1 IMPLANT
PADDING CAST COTTON 3X4 STRL (CAST SUPPLIES) ×3
RUBBERBAND STERILE (MISCELLANEOUS) IMPLANT
SCREW BIOCOM TENODESIS 4X10M (Screw) ×2 IMPLANT
SLEEVE SCD COMPRESS KNEE MED (MISCELLANEOUS) ×3 IMPLANT
SPLINT FIBERGLASS 3X35 (CAST SUPPLIES) IMPLANT
STOCKINETTE 4X48 STRL (DRAPES) ×3 IMPLANT
SUCTION FRAZIER HANDLE 10FR (MISCELLANEOUS)
SUCTION TUBE FRAZIER 10FR DISP (MISCELLANEOUS) IMPLANT
SUT 2 FIBERLOOP 20 STRT BLUE (SUTURE)
SUT ETHIBOND 0 MO6 C/R (SUTURE) IMPLANT
SUT ETHILON 3 0 PS 1 (SUTURE) IMPLANT
SUT ETHILON 4 0 PS 2 18 (SUTURE) ×3 IMPLANT
SUT FIBERWIRE #2 38 T-5 BLUE (SUTURE)
SUT FIBERWIRE 2-0 18 17.9 3/8 (SUTURE)
SUT MNCRL AB 4-0 PS2 18 (SUTURE) IMPLANT
SUT VIC AB 0 SH 27 (SUTURE) IMPLANT
SUT VIC AB 2-0 CT1 27 (SUTURE)
SUT VIC AB 2-0 CT1 TAPERPNT 27 (SUTURE) IMPLANT
SUT VIC AB 2-0 SH 27 (SUTURE) ×3
SUT VIC AB 2-0 SH 27XBRD (SUTURE) ×1 IMPLANT
SUTURE 2 FIBERLOOP 20 STRT BLU (SUTURE) IMPLANT
SUTURE FIBERWR #2 38 T-5 BLUE (SUTURE) IMPLANT
SUTURE FIBERWR 2-0 18 17.9 3/8 (SUTURE) IMPLANT
SYR BULB 3OZ (MISCELLANEOUS) ×3 IMPLANT
SYR CONTROL 10ML LL (SYRINGE) IMPLANT
TOWEL OR 17X24 6PK STRL BLUE (TOWEL DISPOSABLE) ×6 IMPLANT
TOWEL OR NON WOVEN STRL DISP B (DISPOSABLE) ×3 IMPLANT
TRAY DSU PREP LF (CUSTOM PROCEDURE TRAY) ×3 IMPLANT
TUBE CONNECTING 20'X1/4 (TUBING)
TUBE CONNECTING 20X1/4 (TUBING) IMPLANT
UNDERPAD 30X30 (UNDERPADS AND DIAPERS) ×3 IMPLANT

## 2017-03-10 NOTE — H&P (Signed)
PREOPERATIVE H&P  Chief Complaint: right thumb carpometacarpal degenerative joint disease, right carpal tunnel syndrome  HPI: Melanie Phillips is a 51 y.o. female who presents for surgical treatment of right thumb carpometacarpal degenerative joint disease, right carpal tunnel syndrome.  She denies any changes in medical history.  Past Medical History:  Diagnosis Date  . Arthritis   . Back pain   . Depression   . Diabetes mellitus without complication ([place])   . Hyperlipidemia   . Hypertension    Past Surgical History:  Procedure Laterality Date  . TUBAL LIGATION     Social History   Social History  . Marital status: Single    Spouse name: N/A  . Number of children: N/A  . Years of education: N/A   Social History Main Topics  . Smoking status: Current Every Day Smoker    Packs/day: 0.25    Types: Cigarettes  . Smokeless tobacco: Never Used     Comment: using electronic cigs with regular cigs  . Alcohol use 0.0 oz/week     Comment: rarely  . Drug use: No  . Sexual activity: Yes    Birth control/ protection: Surgical   Other Topics Concern  . None   Social History Narrative  . None   History reviewed. No pertinent family history. No Known Allergies Prior to Admission medications   Medication Sig Start Date End Date Taking? Authorizing Provider  ACCU-CHEK FASTCLIX LANCETS MISC Use as needed to check blood sugar. 08/13/14   [place] N Rumley, DO  aspirin EC 81 MG tablet Take 1 tablet (81 mg total) by mouth daily. 08/15/14   Katheren Shams, DO  atorvastatin (LIPITOR) 40 MG tablet Take 1 tablet (40 mg total) by mouth daily. 02/25/17   Katheren Shams, DO  Blood Glucose Monitoring Suppl (ACCU-CHEK NANO SMARTVIEW) W/DEVICE KIT 1 kit by Does not apply route 4 (four) times daily -  before meals and at bedtime. 08/13/14   [place] N Rumley, DO  glucose blood (ACCU-CHEK SMARTVIEW) test strip Use as instructed 02/17/16   Katheren Shams, DO  HYDROcodone-acetaminophen (NORCO)  5-325 MG tablet Take 1 tablet by mouth 3 (three) times daily as needed. 03/03/17 03/11/17  Katheren Shams, DO  lisinopril (PRINIVIL,ZESTRIL) 5 MG tablet Take 1 tablet (5 mg total) by mouth daily. 02/25/17   Katheren Shams, DO  metFORMIN (GLUCOPHAGE) 1000 MG tablet Take 1 tablet (1,000 mg total) by mouth 2 (two) times daily with a meal. 02/25/17   Katheren Shams, DO  metroNIDAZOLE (FLAGYL) 500 MG tablet Take 1 tablet (500 mg total) by mouth 2 (two) times daily. 03/08/17   Katheren Shams, DO  omeprazole (PRILOSEC) 40 MG capsule Take 1 capsule (40 mg total) by mouth daily. 02/25/17   Katheren Shams, DO  sertraline (ZOLOFT) 50 MG tablet Take 1 tablet (50 mg total) by mouth daily. 03/03/17   Katheren Shams, DO     Positive ROS: All other systems have been reviewed and were otherwise negative with the exception of those mentioned in the HPI and as above.  Physical Exam: General: Alert, no acute distress Cardiovascular: No pedal edema Respiratory: No cyanosis, no use of accessory musculature GI: abdomen soft Skin: No lesions in the area of chief complaint Neurologic: Sensation intact distally Psychiatric: Patient is competent for consent with normal mood and affect Lymphatic: no lymphedema  MUSCULOSKELETAL: exam stable  Assessment: right thumb carpometacarpal degenerative joint disease, right carpal tunnel syndrome  Plan:  Plan for Procedure(s): Right thumb Ligament Reconstruction Tendon Interposition, Right Carpal tunnel release CARPAL TUNNEL RELEASE  The risks benefits and alternatives were discussed with the patient including but not limited to the risks of nonoperative treatment, versus surgical intervention including infection, bleeding, nerve injury,  blood clots, cardiopulmonary complications, morbidity, mortality, among others, and they were willing to proceed.   Eduard Roux, MD   03/10/2017 6:49 AM

## 2017-03-10 NOTE — Discharge Instructions (Signed)
Postoperative instructions:  Weightbearing instructions: non weight bearing  Dressing instructions: Keep your dressing and/or splint clean and dry at all times.  It will be removed at your first post-operative appointment.  Your stitches and/or staples will be removed at this visit.  Incision instructions:  Do not soak your incision for 3 weeks after surgery.  If the incision gets wet, pat dry and do not scrub the incision.  Pain control:  You have been given a prescription to be taken as directed for post-operative pain control.  In addition, elevate the operative extremity above the heart at all times to prevent swelling and throbbing pain.  Take over-the-counter Colace, 100mg  by mouth twice a day while taking narcotic pain medications to help prevent constipation.  Follow up appointments: 1) 10-14 days for suture removal and wound check. 2) Dr. Erlinda Hong as scheduled.   -------------------------------------------------------------------------------------------------------------  After Surgery Pain Control:  After your surgery, post-surgical discomfort or pain is likely. This discomfort can last several days to a few weeks. At certain times of the day your discomfort may be more intense.  Did you receive a nerve block?  A nerve block can provide pain relief for one hour to two days after your surgery. As long as the nerve block is working, you will experience little or no sensation in the area the surgeon operated on.  As the nerve block wears off, you will begin to experience pain or discomfort. It is very important that you begin taking your prescribed pain medication before the nerve block fully wears off. Treating your pain at the first sign of the block wearing off will ensure your pain is better controlled and more tolerable when full-sensation returns. Do not wait until the pain is intolerable, as the medicine will be less effective. It is better to treat pain in advance than to try and  catch up.  General Anesthesia:  If you did not receive a nerve block during your surgery, you will need to start taking your pain medication shortly after your surgery and should continue to do so as prescribed by your surgeon.  Pain Medication:  Most commonly we prescribe Vicodin and Percocet for post-operative pain. Both of these medications contain a combination of acetaminophen (Tylenol) and a narcotic to help control pain.   It takes between 30 and 45 minutes before pain medication starts to work. It is important to take your medication before your pain level gets too intense.   Nausea is a common side effect of many pain medications. You will want to eat something before taking your pain medicine to help prevent nausea.   If you are taking a prescription pain medication that contains acetaminophen, we recommend that you do not take additional over the counter acetaminophen (Tylenol).  Other pain relieving options:   Using a cold pack to ice the affected area a few times a day (15 to 20 minutes at a time) can help to relieve pain, reduce swelling and bruising.   Elevation of the affected area can also help to reduce pain and swelling.           Post Anesthesia Home Care Instructions  Activity: Get plenty of rest for the remainder of the day. A responsible individual must stay with you for 24 hours following the procedure.  For the next 24 hours, DO NOT: -Drive a car -Paediatric nurse -Drink alcoholic beverages -Take any medication unless instructed by your physician -Make any legal decisions or sign important papers.  Meals: Start  with liquid foods such as gelatin or soup. Progress to regular foods as tolerated. Avoid greasy, spicy, heavy foods. If nausea and/or vomiting occur, drink only clear liquids until the nausea and/or vomiting subsides. Call your physician if vomiting continues.  Special Instructions/Symptoms: Your throat may feel dry or sore from the  anesthesia or the breathing tube placed in your throat during surgery. If this causes discomfort, gargle with warm salt water. The discomfort should disappear within 24 hours.  If you had a scopolamine patch placed behind your ear for the management of post- operative nausea and/or vomiting:  1. The medication in the patch is effective for 72 hours, after which it should be removed.  Wrap patch in a tissue and discard in the trash. Wash hands thoroughly with soap and water. 2. You may remove the patch earlier than 72 hours if you experience unpleasant side effects which may include dry mouth, dizziness or visual disturbances. 3. Avoid touching the patch. Wash your hands with soap and water after contact with the patch.      Regional Anesthesia Blocks  1. Numbness or the inability to move the "blocked" extremity may last from 3-48 hours after placement. The length of time depends on the medication injected and your individual response to the medication. If the numbness is not going away after 48 hours, call your surgeon.  2. The extremity that is blocked will need to be protected until the numbness is gone and the  Strength has returned. Because you cannot feel it, you will need to take extra care to avoid injury. Because it may be weak, you may have difficulty moving it or using it. You may not know what position it is in without looking at it while the block is in effect.  3. For blocks in the legs and feet, returning to weight bearing and walking needs to be done carefully. You will need to wait until the numbness is entirely gone and the strength has returned. You should be able to move your leg and foot normally before you try and bear weight or walk. You will need someone to be with you when you first try to ensure you do not fall and possibly risk injury.  4. Bruising and tenderness at the needle site are common side effects and will resolve in a few days.  5. Persistent numbness or new  problems with movement should be communicated to the surgeon or the [place] [phone] [place] [phone]).

## 2017-03-10 NOTE — Progress Notes (Signed)
Assisted Dr. Ossey with right, ultrasound guided, supraclavicular block. Side rails up, monitors on throughout procedure. See vital signs in flow sheet. Tolerated Procedure well. 

## 2017-03-10 NOTE — Anesthesia Postprocedure Evaluation (Signed)
Anesthesia Post Note  Patient: Melanie Phillips  Procedure(s) Performed: Procedure(s) (LRB): Right thumb Ligament Reconstruction Tendon Interposition, Right Carpal tunnel release (Right) CARPAL TUNNEL RELEASE (Right)  Patient location during evaluation: PACU Anesthesia Type: Regional Level of consciousness: awake and alert and patient cooperative Pain management: pain level controlled Vital Signs Assessment: post-procedure vital signs reviewed and stable Respiratory status: spontaneous breathing and respiratory function stable Cardiovascular status: stable Anesthetic complications: no       Last Vitals:  Vitals:   03/10/17 1137 03/10/17 1208  BP:  (!) 130/94  Pulse: 85 72  Resp: 16 18  Temp:  36.4 C    Last Pain:  Vitals:   03/10/17 1208  TempSrc:   PainSc: 0-No pain                 Encarnacion Scioneaux DAVID

## 2017-03-10 NOTE — Transfer of Care (Signed)
Immediate Anesthesia Transfer of Care Note  Patient: Melanie Phillips  Procedure(s) Performed: Procedure(s): Right thumb Ligament Reconstruction Tendon Interposition, Right Carpal tunnel release (Right) CARPAL TUNNEL RELEASE (Right)  Patient Location: PACU  Anesthesia Type:MAC combined with regional for post-op pain  Level of Consciousness: sedated  Airway & Oxygen Therapy: Patient Spontanous Breathing and Patient connected to face mask oxygen  Post-op Assessment: Report given to RN and Post -op Vital signs reviewed and stable  Post vital signs: Reviewed and stable  Last Vitals:  Vitals:   03/10/17 0915 03/10/17 1119  BP: (!) 142/76   Pulse: 77 88  Resp: 12 15  Temp:      Last Pain:  Vitals:   03/10/17 0801  TempSrc: Oral  PainSc: 10-Worst pain ever         Complications: No apparent anesthesia complications

## 2017-03-10 NOTE — Op Note (Signed)
   DATE OF SURGERY:03/10/2017  PREOPERATIVE DIAGNOSIS:  1. Right basal joint arthritis. 2. Right carpal tunnel syndrome  POSTOPERATIVE DIAGNOSIS: same.  PROCEDURE:  1. Right thumb carpometacarpal interposition arthroplasty ([id number]); thumb  2. Right carpometacarpal reconstruction with tendon graft ([id number]).   3. Right carpal tunnel release, open  IMPLANTS: Arthrex 4 x 10 biocomposite tenodesis screw  SURGEON: N. Eduard Roux, MD  ASSISTANT: None  ANESTHESIA: Supraclavicular block  TOURNIQUET TIME: 1 hour.  BLOOD LOSS: Minimal.  COMPLICATIONS: None.  PATHOLOGY: None.  TIME OUT: A time out was performed before the procedure started.  INDICATIONS FOR PROCEDURE: The patient presents today for the above mentioned procedure after failing extensive conservative measures.  The risks, benefits, and alternatives to surgery were discussed and the patient elected to proceed with surgery.  DESCRIPTION OF PROCEDURE: I first began with the carpal tunnel release. A 2 cm incision proximally 5 mm ulnar to the thenar crease at the base of the palm was created. Blunt dissection was taken down to the palmar fascia. The palmar fascia was sharply incised in line with the incision. The transverse carpal ligament was then identified and the distal edge was bluntly dissected with a mosquito. A mosquito was then placed just deep to the transverse carpal ligament. Under direct visualization Beaver blade was used to release the transverse carpal ligament from distal to proximal. A subcutaneous tunnel was then created for placement of a sore retractor. With the wrist in flexion I then released the rest of the transverse carpal ligament and the antebrachial fascia under direct visualization. I then released the last few remaining fibers of the transverse carpal ligament distally. Care was taken not to violate the superficial arch. The wound was then thoroughly irrigated. Hemostasis was obtained. The skin was  closed with interrupted 4-0 nylon. I then turned my attention to the thumb [facility] arthroplasty. A Wagner's incision was made. The superficial radial nerve was identified and protected. Radial artery was exposed and protected. We dissected sharply in between the extensor pollicis brevis and abductor pollicis longus tendon interval. Capsulotomy was performed. Capsular flaps were raised. Fluoroscopy was used to identify the borders of the trapezium.  Trapezium was exposed and removed in a piecemeal fashion. A 4 mm drill hole was made in the base of the first metacarpal in a volar ulnar direction.  I then made a second incision about 4 cm proximal to the wrist crease. Flexor carpi radialis tendon was exposed and transected. The FCR tendon then was delivered out through the Wagner's incision.  The FCR tendon was passed through the drill hole and came out radial dorsally over the first metacarpal. With the FCR tendon appropriately tensioned and confirmed with fluoroscopy, the tenodesis screw was placed.  The wound was irrigated. The capsular tissue was repaired using 2-0 Vicryl sutures. Then tourniquet was deflated. Hemostasis achieved. Wounds were irrigated and closed using 4-0 nylon sutures. Sterile dressing applied, hand immobilized in a thumb spica splint. The patient then was transferred to the recovery room in stable condition after all counts were correct.  POSTOPERATIVE PLAN: Return in two weeks for suture removal and application of a thumb spica cast. Four weeks after surgery cast will be removed and switch to a thumb spica brace and start hand therapy.

## 2017-03-10 NOTE — Anesthesia Procedure Notes (Signed)
Anesthesia Regional Block: Supraclavicular block   Pre-Anesthetic Checklist: ,, timeout performed, Correct Patient, Correct Site, Correct Laterality, Correct Procedure, Correct Position, site marked, Risks and benefits discussed,  Surgical consent,  Pre-op evaluation,  At surgeon's request and post-op pain management  Laterality: Right  Prep: chloraprep       Needles:  Injection technique: Single-shot  Needle Type: Echogenic Stimulator Needle     Needle Length: 9cm  Needle Gauge: 21     Additional Needles:   Procedures: ultrasound guided, nerve stimulator,,,,,,   Nerve Stimulator or Paresthesia:  Response: 0.4 mA,   Additional Responses:   Narrative:  Start time: 03/10/2017 9:00 AM End time: 03/10/2017 9:10 AM Injection made incrementally with aspirations every 5 mL.  Performed by: Personally  Anesthesiologist: Lillia Abed  Additional Notes: Monitors applied. Patient sedated. Sterile prep and drape,hand hygiene and sterile gloves were used. Relevant anatomy identified.Needle position confirmed.Local anesthetic injected incrementally after negative aspiration. Local anesthetic spread visualized around nerve(s). Vascular puncture avoided. No complications. Image printed for medical record.The patient tolerated the procedure well.

## 2017-03-10 NOTE — Anesthesia Preprocedure Evaluation (Signed)
Anesthesia Evaluation  Patient identified by MRN, date of birth, ID band Patient awake    Reviewed: Allergy & Precautions, NPO status , Patient's Chart, lab work & pertinent test results  Airway Mallampati: I  TM Distance: >3 FB Neck ROM: Full    Dental   Pulmonary Current Smoker,    Pulmonary exam normal        Cardiovascular hypertension, Pt. on medications Normal cardiovascular exam     Neuro/Psych    GI/Hepatic   Endo/Other  diabetes, Type 2, Oral Hypoglycemic Agents  Renal/GU      Musculoskeletal   Abdominal   Peds  Hematology   Anesthesia Other Findings   Reproductive/Obstetrics                             Anesthesia Physical Anesthesia Plan  ASA: II  Anesthesia Plan: Regional and MAC   Post-op Pain Management:    Induction: Intravenous  Airway Management Planned: Simple Face Mask  Additional Equipment:   Intra-op Plan:   Post-operative Plan:   Informed Consent: I have reviewed the patients History and Physical, chart, labs and discussed the procedure including the risks, benefits and alternatives for the proposed anesthesia with the patient or authorized representative who has indicated his/her understanding and acceptance.     Plan Discussed with: CRNA and Surgeon  Anesthesia Plan Comments:         Anesthesia Quick Evaluation

## 2017-03-11 NOTE — Addendum Note (Signed)
Addendum  created 03/11/17 1008 by Tawni Millers, CRNA   Charge Capture section accepted

## 2017-03-12 ENCOUNTER — Encounter (HOSPITAL_BASED_OUTPATIENT_CLINIC_OR_DEPARTMENT_OTHER): Payer: Self-pay | Admitting: Orthopaedic Surgery

## 2017-03-15 ENCOUNTER — Encounter (HOSPITAL_BASED_OUTPATIENT_CLINIC_OR_DEPARTMENT_OTHER): Payer: Self-pay | Admitting: Orthopaedic Surgery

## 2017-03-23 ENCOUNTER — Ambulatory Visit (INDEPENDENT_AMBULATORY_CARE_PROVIDER_SITE_OTHER): Payer: Self-pay | Admitting: Orthopaedic Surgery

## 2017-03-23 DIAGNOSIS — M1811 Unilateral primary osteoarthritis of first carpometacarpal joint, right hand: Secondary | ICD-10-CM

## 2017-03-23 DIAGNOSIS — G5601 Carpal tunnel syndrome, right upper limb: Secondary | ICD-10-CM

## 2017-03-23 MED ORDER — HYDROCODONE-ACETAMINOPHEN 7.5-325 MG PO TABS: 1.0000 | ORAL_TABLET | Freq: Four times a day (QID) | ORAL | 0 refills | Status: DC | PRN

## 2017-03-23 NOTE — Progress Notes (Signed)
Melanie Phillips is 2 weeks status post right carpal tunnel release and right thumb CMC arthroplasty. She is complaining of pain but this is controlled by pain medicine. Her incisions have healed and exhibit no signs of infection. No signs of CRPS. The sutures were removed. Thumb spica brace was applied today. She needs to wear this at all times except for when showering. Norco prescribed. Also needs to take ibuprofen between. I'll see her back in 2 weeks for recheck. We'll begin hand therapy at that time.

## 2017-03-26 ENCOUNTER — Other Ambulatory Visit: Payer: Self-pay | Admitting: Obstetrics and Gynecology

## 2017-04-02 ENCOUNTER — Encounter: Payer: Self-pay | Admitting: Obstetrics and Gynecology

## 2017-04-06 ENCOUNTER — Ambulatory Visit (INDEPENDENT_AMBULATORY_CARE_PROVIDER_SITE_OTHER): Payer: Self-pay | Admitting: Orthopaedic Surgery

## 2017-04-06 ENCOUNTER — Telehealth: Payer: Self-pay | Admitting: Obstetrics and Gynecology

## 2017-04-06 ENCOUNTER — Encounter (INDEPENDENT_AMBULATORY_CARE_PROVIDER_SITE_OTHER): Payer: Self-pay | Admitting: Orthopaedic Surgery

## 2017-04-06 DIAGNOSIS — M1811 Unilateral primary osteoarthritis of first carpometacarpal joint, right hand: Secondary | ICD-10-CM

## 2017-04-06 MED ORDER — HYDROCODONE-ACETAMINOPHEN 5-325 MG PO TABS: 1.0000 | ORAL_TABLET | Freq: Every day | ORAL | 0 refills | Status: DC | PRN

## 2017-04-06 NOTE — Progress Notes (Signed)
Melanie Phillips is 4 weeks status post right thumb CMC arthroplasty and carpal tunnel release. She is doing well overall. She is getting better. Her symptoms are improved. At this point begin hand therapy. Norco was filled from last time. Follow-up with me in 6 weeks for recheck.

## 2017-04-06 NOTE — Telephone Encounter (Signed)
Spoke with the breast center and was informed that patients who have CAFA will need to be scheduled through their BCCCP program and will get their mammogram through a scholarship.  LM for [facility] is in charge of scheduling [phone] with patient's info and also called patient and let her know that call this number if she hasn't heard anything by Thursday.  Patient voiced understanding. Jazmin Hartsell,CMA

## 2017-04-06 NOTE — Telephone Encounter (Signed)
Pt was told by [facility] that they dont take the orange card or the 100% discount letter.  Please let pt know who would do the mammogram. She also hasnt heard from anybody about her colonoscopy.  Please advise

## 2017-05-18 ENCOUNTER — Other Ambulatory Visit: Payer: Self-pay | Admitting: *Deleted

## 2017-05-18 ENCOUNTER — Ambulatory Visit (INDEPENDENT_AMBULATORY_CARE_PROVIDER_SITE_OTHER): Payer: Self-pay | Admitting: Orthopaedic Surgery

## 2017-05-18 ENCOUNTER — Encounter (INDEPENDENT_AMBULATORY_CARE_PROVIDER_SITE_OTHER): Payer: Self-pay | Admitting: Orthopaedic Surgery

## 2017-05-18 DIAGNOSIS — M1811 Unilateral primary osteoarthritis of first carpometacarpal joint, right hand: Secondary | ICD-10-CM

## 2017-05-18 DIAGNOSIS — M65312 Trigger thumb, left thumb: Secondary | ICD-10-CM

## 2017-05-18 DIAGNOSIS — G5601 Carpal tunnel syndrome, right upper limb: Secondary | ICD-10-CM

## 2017-05-18 NOTE — Progress Notes (Signed)
Office Visit Note   Patient: Melanie Phillips           Date of Birth: 20-Mar-1966           MRN: [id number] Visit Date: 05/18/2017              Requested by: Katheren Shams, DO [address], [place] [address] PCP: Katheren Shams, DO   Assessment & Plan: Visit Diagnoses:  1. Trigger thumb, left thumb   2. Carpal tunnel syndrome of right wrist   3. Arthritis of carpometacarpal [facility]) joint of right thumb     Plan: Patient is doing well from her right hand surgeries. She would like a left trigger thumb release. We discussed risks benefits alternatives to surgery she understands and wishes proceed. We'll get her scheduled as soon as possible  Follow-Up Instructions: Return in about 2 weeks (around 06/01/2017).   Orders:  No orders of the defined types were placed in this encounter.  No orders of the defined types were placed in this encounter.     Procedures: No procedures performed   Clinical Data: No additional findings.   Subjective: Chief Complaint  Patient presents with  . Follow-up    right thumb recon and rt CTR    Mrs. Tkach is a 69 days status post right thumb CMC arthroplasty right carpal tunnel release. She is doing well. She has completed hand therapy. She gets some occasional numbness and tenderness around the scar. She is mainly complaining of a new problem of left trigger thumb with persistent triggering and locking. She had a previous reaction to a cortisone injection in her knees and is not interested in another injection.    Review of Systems  Constitutional: Negative.   HENT: Negative.   Eyes: Negative.   Respiratory: Negative.   Cardiovascular: Negative.   Endocrine: Negative.   Musculoskeletal: Negative.   Neurological: Negative.   Hematological: Negative.   Psychiatric/Behavioral: Negative.   All other systems reviewed and are negative.    Objective: Vital Signs: There were no vitals taken for this visit.  Physical  Exam  Constitutional: She is oriented to person, place, and time. She appears well-developed and well-nourished.  Pulmonary/Chest: Effort normal.  Neurological: She is alert and oriented to person, place, and time.  Skin: Skin is warm. Capillary refill takes less than 2 seconds.  Psychiatric: She has a normal mood and affect. Her behavior is normal. Judgment and thought content normal.  Nursing note and vitals reviewed.   Ortho Exam Right hand exam shows fully healed surgical scars. She has excellent function of her right hand. Left thumb exam shows persistent and painful triggering of the thumb. Specialty Comments:  No specialty comments available.  Imaging: No results found.   PMFS History: Patient Active Problem List   Diagnosis Date Noted  . Trigger thumb, left thumb 05/18/2017  . Arthritis of carpometacarpal [facility]) joint of right thumb 03/10/2017  . Essential hypertension 03/04/2017  . Healthcare maintenance 03/04/2017  . Depression 03/09/2016  . Carpal tunnel syndrome 03/09/2016  . Diabetes mellitus, type 2 ([place]) 08/15/2014  . Poor dental hygiene 08/15/2014  . Sciatica 05/31/2012  . OBESITY, UNSPECIFIED 03/07/2010  . TOBACCO ABUSE 03/07/2010  . PAIN IN JOINT, MULTIPLE SITES 03/07/2010   Past Medical History:  Diagnosis Date  . Arthritis   . Back pain   . Depression   . Diabetes mellitus without complication ([place])   . Hyperlipidemia   . Hypertension  No family history on file.  Past Surgical History:  Procedure Laterality Date  . CARPAL TUNNEL RELEASE Right 03/10/2017   Procedure: CARPAL TUNNEL RELEASE;  Surgeon: Leandrew Koyanagi, MD;  Location: [place];  Service: Orthopedics;  Laterality: Right;  . CARPOMETACARPEL SUSPENSION PLASTY Right 03/10/2017   Procedure: Right thumb Ligament Reconstruction Tendon Interposition, Right Carpal tunnel release;  Surgeon: Leandrew Koyanagi, MD;  Location: [place];  Service: Orthopedics;  Laterality:  Right;  . TUBAL LIGATION     Social History   Occupational History  . Not on file.   Social History Main Topics  . Smoking status: Current Every Day Smoker    Packs/day: 0.25    Types: Cigarettes  . Smokeless tobacco: Never Used     Comment: using electronic cigs with regular cigs  . Alcohol use 0.0 oz/week     Comment: rarely  . Drug use: No  . Sexual activity: Yes    Birth control/ protection: Surgical

## 2017-05-19 MED ORDER — LISINOPRIL 5 MG PO TABS: 5.0000 mg | ORAL_TABLET | Freq: Every day | ORAL | 3 refills | Status: DC

## 2017-05-19 MED ORDER — ATORVASTATIN CALCIUM 40 MG PO TABS: 40.0000 mg | ORAL_TABLET | Freq: Every day | ORAL | 3 refills | Status: DC

## 2017-05-19 MED ORDER — METFORMIN HCL 1000 MG PO TABS: 1000.0000 mg | ORAL_TABLET | Freq: Two times a day (BID) | ORAL | 4 refills | Status: DC

## 2017-05-19 MED ORDER — OMEPRAZOLE 40 MG PO CPDR: 40.0000 mg | DELAYED_RELEASE_CAPSULE | Freq: Every day | ORAL | 2 refills | Status: DC

## 2017-05-20 ENCOUNTER — Telehealth: Payer: Self-pay | Admitting: *Deleted

## 2017-05-20 NOTE — Telephone Encounter (Signed)
Juliann Pulse from the [facility] called regarding the recent denial of metronidazole.  Per Juliann Pulse patient didn't pickup metformin, atorvastatin, omeprazole from 03/03/17, sertraline from 02/24/17 or the metronidazole from 03/09/17.  Please give her a call at [phone].  Derl Barrow, RN

## 2017-05-21 ENCOUNTER — Other Ambulatory Visit (INDEPENDENT_AMBULATORY_CARE_PROVIDER_SITE_OTHER): Payer: Self-pay | Admitting: Orthopaedic Surgery

## 2017-05-21 ENCOUNTER — Other Ambulatory Visit (HOSPITAL_COMMUNITY): Payer: Self-pay | Admitting: *Deleted

## 2017-05-21 ENCOUNTER — Encounter (HOSPITAL_BASED_OUTPATIENT_CLINIC_OR_DEPARTMENT_OTHER): Payer: Self-pay | Admitting: *Deleted

## 2017-05-21 DIAGNOSIS — N6320 Unspecified lump in the left breast, unspecified quadrant: Secondary | ICD-10-CM

## 2017-05-21 DIAGNOSIS — M65312 Trigger thumb, left thumb: Secondary | ICD-10-CM

## 2017-05-21 NOTE — Progress Notes (Signed)
   05/21/17 1212  OBSTRUCTIVE SLEEP APNEA  Have you ever been diagnosed with sleep apnea through a sleep study? No  Do you snore loudly (loud enough to be heard through closed doors)?  0  Do you often feel tired, fatigued, or sleepy during the daytime (such as falling asleep during driving or talking to someone)? 0  Has anyone observed you stop breathing during your sleep? 0  Do you have, or are you being treated for high blood pressure? 1  BMI more than 35 kg/m2? 1  Age > 50 (1-yes) 1  Neck circumference greater than:Female 16 inches or larger, Female 17inches or larger? 1  Female Gender (Yes=1) 0  Obstructive Sleep Apnea Score 4

## 2017-05-23 NOTE — Telephone Encounter (Signed)
Tried calling twice with no response. I will follow-up with patient on why she has not been picking up her medications. Thanks

## 2017-05-25 ENCOUNTER — Encounter (HOSPITAL_BASED_OUTPATIENT_CLINIC_OR_DEPARTMENT_OTHER)
Admission: RE | Admit: 2017-05-25 | Discharge: 2017-05-25 | Disposition: A | Discharge status: Home / Self Care | Payer: No Typology Code available for payment source | Source: Ambulatory Visit | Attending: Orthopaedic Surgery | Admitting: Orthopaedic Surgery

## 2017-05-25 LAB — BASIC METABOLIC PANEL
ANION GAP: 6 (ref 5–15)
BUN: 10 mg/dL (ref 6–20)
CO2: 28 mmol/L (ref 22–32)
Calcium: 9.4 mg/dL (ref 8.9–10.3)
Chloride: 105 mmol/L (ref 101–111)
Creatinine, Ser: 0.76 mg/dL (ref 0.44–1.00)
Glucose, Bld: 131 mg/dL — ABNORMAL HIGH (ref 65–99)
POTASSIUM: 4.7 mmol/L (ref 3.5–5.1)
Sodium: 139 mmol/L (ref 135–145)

## 2017-05-26 ENCOUNTER — Ambulatory Visit (HOSPITAL_BASED_OUTPATIENT_CLINIC_OR_DEPARTMENT_OTHER): Payer: Self-pay | Admitting: Anesthesiology

## 2017-05-26 ENCOUNTER — Ambulatory Visit (HOSPITAL_BASED_OUTPATIENT_CLINIC_OR_DEPARTMENT_OTHER)
Admission: RE | Admit: 2017-05-26 | Discharge: 2017-05-26 | Disposition: A | Discharge status: Home / Self Care | Payer: Self-pay | Source: Ambulatory Visit | Attending: Orthopaedic Surgery | Admitting: Orthopaedic Surgery

## 2017-05-26 ENCOUNTER — Encounter (HOSPITAL_BASED_OUTPATIENT_CLINIC_OR_DEPARTMENT_OTHER)
Admission: RE | Disposition: A | Discharge status: Home / Self Care | Payer: Self-pay | Source: Ambulatory Visit | Attending: Orthopaedic Surgery

## 2017-05-26 ENCOUNTER — Encounter (HOSPITAL_BASED_OUTPATIENT_CLINIC_OR_DEPARTMENT_OTHER): Payer: Self-pay | Admitting: *Deleted

## 2017-05-26 DIAGNOSIS — F17200 Nicotine dependence, unspecified, uncomplicated: Secondary | ICD-10-CM | POA: Insufficient documentation

## 2017-05-26 DIAGNOSIS — M65312 Trigger thumb, left thumb: Secondary | ICD-10-CM

## 2017-05-26 DIAGNOSIS — S66012A Strain of long flexor muscle, fascia and tendon of left thumb at wrist and hand level, initial encounter: Secondary | ICD-10-CM | POA: Insufficient documentation

## 2017-05-26 DIAGNOSIS — F17210 Nicotine dependence, cigarettes, uncomplicated: Secondary | ICD-10-CM | POA: Insufficient documentation

## 2017-05-26 DIAGNOSIS — X58XXXA Exposure to other specified factors, initial encounter: Secondary | ICD-10-CM | POA: Insufficient documentation

## 2017-05-26 DIAGNOSIS — I10 Essential (primary) hypertension: Secondary | ICD-10-CM | POA: Insufficient documentation

## 2017-05-26 DIAGNOSIS — M659 Synovitis and tenosynovitis, unspecified: Secondary | ICD-10-CM

## 2017-05-26 DIAGNOSIS — Z7982 Long term (current) use of aspirin: Secondary | ICD-10-CM | POA: Insufficient documentation

## 2017-05-26 DIAGNOSIS — E119 Type 2 diabetes mellitus without complications: Secondary | ICD-10-CM | POA: Insufficient documentation

## 2017-05-26 DIAGNOSIS — F329 Major depressive disorder, single episode, unspecified: Secondary | ICD-10-CM | POA: Insufficient documentation

## 2017-05-26 DIAGNOSIS — Z7984 Long term (current) use of oral hypoglycemic drugs: Secondary | ICD-10-CM | POA: Insufficient documentation

## 2017-05-26 DIAGNOSIS — F419 Anxiety disorder, unspecified: Secondary | ICD-10-CM | POA: Insufficient documentation

## 2017-05-26 DIAGNOSIS — M65949 Unspecified synovitis and tenosynovitis, unspecified hand: Secondary | ICD-10-CM

## 2017-05-26 DIAGNOSIS — K219 Gastro-esophageal reflux disease without esophagitis: Secondary | ICD-10-CM | POA: Insufficient documentation

## 2017-05-26 DIAGNOSIS — Z79899 Other long term (current) drug therapy: Secondary | ICD-10-CM | POA: Insufficient documentation

## 2017-05-26 HISTORY — PX: TRIGGER FINGER RELEASE: SHX641

## 2017-05-26 HISTORY — DX: Anxiety disorder, unspecified: F41.9

## 2017-05-26 HISTORY — DX: Gastro-esophageal reflux disease without esophagitis: K21.9

## 2017-05-26 LAB — GLUCOSE, CAPILLARY
Glucose-Capillary: 110 mg/dL — ABNORMAL HIGH (ref 65–99)
Glucose-Capillary: 115 mg/dL — ABNORMAL HIGH (ref 65–99)

## 2017-05-26 SURGERY — RELEASE, A1 PULLEY, FOR TRIGGER FINGER
Anesthesia: Regional | Site: Thumb | Laterality: Left

## 2017-05-26 MED ORDER — BUPIVACAINE HCL (PF) 0.25 % IJ SOLN
INTRAMUSCULAR | Status: DC | PRN
  Administered 2017-05-26: 6 mL

## 2017-05-26 MED ORDER — FENTANYL CITRATE (PF) 100 MCG/2ML IJ SOLN
INTRAMUSCULAR | Status: AC
  Filled 2017-05-26: qty 2

## 2017-05-26 MED ORDER — CEFAZOLIN SODIUM-DEXTROSE 2-4 GM/100ML-% IV SOLN
INTRAVENOUS | Status: AC
Start: 2017-05-26 — End: 2017-05-26
  Filled 2017-05-26: qty 100

## 2017-05-26 MED ORDER — ONDANSETRON HCL 4 MG/2ML IJ SOLN: 4.0000 mg | Freq: Once | INTRAMUSCULAR | Status: DC | PRN

## 2017-05-26 MED ORDER — FENTANYL CITRATE (PF) 100 MCG/2ML IJ SOLN
25.0000 ug | INTRAMUSCULAR | Status: DC | PRN
  Administered 2017-05-26: 50 ug via INTRAVENOUS

## 2017-05-26 MED ORDER — LIDOCAINE 2% (20 MG/ML) 5 ML SYRINGE
INTRAMUSCULAR | Status: DC | PRN
  Administered 2017-05-26: 80 mg via INTRAVENOUS

## 2017-05-26 MED ORDER — FENTANYL CITRATE (PF) 100 MCG/2ML IJ SOLN
INTRAMUSCULAR | Status: DC | PRN
  Administered 2017-05-26: 50 ug via INTRAVENOUS

## 2017-05-26 MED ORDER — CEFAZOLIN SODIUM-DEXTROSE 2-4 GM/100ML-% IV SOLN
2.0000 g | INTRAVENOUS | Status: AC
  Administered 2017-05-26: 2 g via INTRAVENOUS

## 2017-05-26 MED ORDER — ONDANSETRON HCL 4 MG/2ML IJ SOLN
INTRAMUSCULAR | Status: DC | PRN
  Administered 2017-05-26: 4 mg via INTRAVENOUS

## 2017-05-26 MED ORDER — MIDAZOLAM HCL 2 MG/2ML IJ SOLN
INTRAMUSCULAR | Status: AC
  Filled 2017-05-26: qty 2

## 2017-05-26 MED ORDER — HYDROCODONE-ACETAMINOPHEN 7.5-325 MG PO TABS: 1.0000 | ORAL_TABLET | Freq: Four times a day (QID) | ORAL | 0 refills | Status: DC | PRN

## 2017-05-26 MED ORDER — HYDROCODONE-ACETAMINOPHEN 5-325 MG PO TABS
ORAL_TABLET | ORAL | Status: AC
  Filled 2017-05-26: qty 1

## 2017-05-26 MED ORDER — LIDOCAINE HCL (PF) 0.5 % IJ SOLN
INTRAMUSCULAR | Status: DC | PRN
  Administered 2017-05-26: 30 mL via INTRAVENOUS

## 2017-05-26 MED ORDER — MIDAZOLAM HCL 5 MG/5ML IJ SOLN
INTRAMUSCULAR | Status: DC | PRN
  Administered 2017-05-26: 1 mg via INTRAVENOUS

## 2017-05-26 MED ORDER — HYDROCODONE-ACETAMINOPHEN 5-325 MG PO TABS
1.0000 | ORAL_TABLET | Freq: Once | ORAL | Status: AC
  Administered 2017-05-26: 1 via ORAL

## 2017-05-26 MED ORDER — LACTATED RINGERS IV SOLN
INTRAVENOUS | Status: DC
  Administered 2017-05-26: 09:00:00 via INTRAVENOUS

## 2017-05-26 MED ORDER — ONDANSETRON HCL 4 MG/2ML IJ SOLN
INTRAMUSCULAR | Status: AC
  Filled 2017-05-26: qty 2

## 2017-05-26 MED ORDER — PROPOFOL 500 MG/50ML IV EMUL
INTRAVENOUS | Status: DC | PRN
  Administered 2017-05-26: 75 ug/kg/min via INTRAVENOUS

## 2017-05-26 SURGICAL SUPPLY — 45 items
BANDAGE ACE 3X5.8 VEL STRL LF (GAUZE/BANDAGES/DRESSINGS) ×3 IMPLANT
BLADE MINI RND TIP GREEN BEAV (BLADE) ×3 IMPLANT
BLADE SURG 15 STRL LF DISP TIS (BLADE) ×1 IMPLANT
BLADE SURG 15 STRL SS (BLADE) ×3
BNDG CMPR 9X4 STRL LF SNTH (GAUZE/BANDAGES/DRESSINGS) ×1
BNDG ESMARK 4X9 LF (GAUZE/BANDAGES/DRESSINGS) ×3 IMPLANT
BRUSH SCRUB EZ PLAIN DRY (MISCELLANEOUS) ×3 IMPLANT
CORDS BIPOLAR (ELECTRODE) IMPLANT
COVER BACK TABLE 60X90IN (DRAPES) ×3 IMPLANT
COVER MAYO STAND STRL (DRAPES) ×3 IMPLANT
CUFF TOURNIQUET SINGLE 18IN (TOURNIQUET CUFF) ×2 IMPLANT
DECANTER SPIKE VIAL GLASS SM (MISCELLANEOUS) IMPLANT
DRAPE EXTREMITY T 121X128X90 (DRAPE) ×3 IMPLANT
DRAPE SURG 17X23 STRL (DRAPES) ×3 IMPLANT
GAUZE SPONGE 4X4 12PLY STRL (GAUZE/BANDAGES/DRESSINGS) ×3 IMPLANT
GAUZE SPONGE 4X4 16PLY XRAY LF (GAUZE/BANDAGES/DRESSINGS) IMPLANT
GAUZE XEROFORM 1X8 LF (GAUZE/BANDAGES/DRESSINGS) ×3 IMPLANT
GLOVE BIOGEL PI IND STRL 7.0 (GLOVE) IMPLANT
GLOVE BIOGEL PI IND STRL 8 (GLOVE) IMPLANT
GLOVE BIOGEL PI INDICATOR 7.0 (GLOVE) ×2
GLOVE BIOGEL PI INDICATOR 8 (GLOVE) ×2
GLOVE ECLIPSE 6.5 STRL STRAW (GLOVE) ×2 IMPLANT
GLOVE SKINSENSE NS SZ7.5 (GLOVE) ×2
GLOVE SKINSENSE STRL SZ7.5 (GLOVE) ×1 IMPLANT
GLOVE SURG SS PI 6.5 STRL IVOR (GLOVE) ×2 IMPLANT
GLOVE SURG SYN 7.5  E (GLOVE) ×2
GLOVE SURG SYN 7.5 E (GLOVE) ×1 IMPLANT
GLOVE SURG SYN 7.5 PF PI (GLOVE) ×1 IMPLANT
GOWN SRG XL LVL 4 BRTHBL STRL (GOWNS) ×1 IMPLANT
GOWN STRL NON-REIN XL LVL4 (GOWNS) ×3
GOWN STRL REIN XL XLG (GOWN DISPOSABLE) ×3 IMPLANT
NEEDLE HYPO 22GX1.5 SAFETY (NEEDLE) ×2 IMPLANT
NS IRRIG 1000ML POUR BTL (IV SOLUTION) ×3 IMPLANT
PACK BASIN DAY SURGERY FS (CUSTOM PROCEDURE TRAY) ×3 IMPLANT
PAD CAST 3X4 CTTN HI CHSV (CAST SUPPLIES) ×1 IMPLANT
PADDING CAST COTTON 3X4 STRL (CAST SUPPLIES) ×3
RUBBERBAND STERILE (MISCELLANEOUS) ×6 IMPLANT
STOCKINETTE 4X48 STRL (DRAPES) ×3 IMPLANT
SUT ETHILON 4 0 PS 2 18 (SUTURE) ×3 IMPLANT
SUT VICRYL 4-0 PS2 18IN ABS (SUTURE) ×2 IMPLANT
SYR BULB 3OZ (MISCELLANEOUS) ×3 IMPLANT
SYR CONTROL 10ML LL (SYRINGE) ×2 IMPLANT
TOWEL OR 17X24 6PK STRL BLUE (TOWEL DISPOSABLE) ×3 IMPLANT
TRAY DSU PREP LF (CUSTOM PROCEDURE TRAY) ×3 IMPLANT
UNDERPAD 30X30 (UNDERPADS AND DIAPERS) ×3 IMPLANT

## 2017-05-26 NOTE — H&P (Signed)
PREOPERATIVE H&P  Chief Complaint: left trigger thumb  HPI: Melanie Phillips is a 51 y.o. female who presents for surgical treatment of left trigger thumb.  She denies any changes in medical history.  Past Medical History:  Diagnosis Date  . Anxiety   . Arthritis   . Back pain   . Depression   . Diabetes mellitus without complication ([place])   . GERD (gastroesophageal reflux disease)   . Hyperlipidemia   . Hypertension    Past Surgical History:  Procedure Laterality Date  . CARPAL TUNNEL RELEASE Right 03/10/2017   Procedure: CARPAL TUNNEL RELEASE;  Surgeon: Leandrew Koyanagi, MD;  Location: [place];  Service: Orthopedics;  Laterality: Right;  . CARPOMETACARPEL SUSPENSION PLASTY Right 03/10/2017   Procedure: Right thumb Ligament Reconstruction Tendon Interposition, Right Carpal tunnel release;  Surgeon: Leandrew Koyanagi, MD;  Location: [place];  Service: Orthopedics;  Laterality: Right;  . TUBAL LIGATION     Social History   Social History  . Marital status: Single    Spouse name: N/A  . Number of children: N/A  . Years of education: N/A   Social History Main Topics  . Smoking status: Current Every Day Smoker    Packs/day: 0.25    Types: Cigarettes  . Smokeless tobacco: Never Used     Comment: using electronic cigs with regular cigs  . Alcohol use 0.0 oz/week     Comment: rarely  . Drug use: No  . Sexual activity: Yes    Birth control/ protection: Surgical   Other Topics Concern  . None   Social History Narrative  . None   History reviewed. No pertinent family history. No Known Allergies Prior to Admission medications   Medication Sig Start Date End Date Taking? Authorizing Provider  aspirin EC 81 MG tablet Take 1 tablet (81 mg total) by mouth daily. 08/15/14  Yes Katheren Shams, DO  HYDROcodone-acetaminophen (NORCO) 7.5-325 MG tablet Take 1 tablet by mouth every 6 (six) hours as needed for moderate pain. 03/23/17  Yes Leandrew Koyanagi,  MD  lisinopril (PRINIVIL,ZESTRIL) 5 MG tablet Take 1 tablet (5 mg total) by mouth daily. 05/19/17  Yes Luiz Blare Y, DO  metFORMIN (GLUCOPHAGE) 1000 MG tablet Take 1 tablet (1,000 mg total) by mouth 2 (two) times daily with a meal. 05/19/17  Yes Luiz Blare Y, DO  omeprazole (PRILOSEC) 40 MG capsule Take 1 capsule (40 mg total) by mouth daily. 05/19/17  Yes Phelps, Aviva Signs Y, DO  ACCU-CHEK FASTCLIX LANCETS MISC Use as needed to check blood sugar. 08/13/14   Rumley, Burna Cash, DO  Blood Glucose Monitoring Suppl (ACCU-CHEK NANO SMARTVIEW) W/DEVICE KIT 1 kit by Does not apply route 4 (four) times daily -  before meals and at bedtime. 08/13/14   Rumley, Burna Cash, DO  glucose blood (ACCU-CHEK SMARTVIEW) test strip Use as instructed 02/17/16   Katheren Shams, DO  HYDROcodone-acetaminophen (NORCO) 5-325 MG tablet Take 1 tablet by mouth daily as needed. 04/06/17   Leandrew Koyanagi, MD  HYDROcodone-acetaminophen (NORCO) 7.5-325 MG tablet Take 1-2 tablets by mouth every 6 (six) hours as needed for moderate pain. 03/10/17   Leandrew Koyanagi, MD  metroNIDAZOLE (FLAGYL) 500 MG tablet Take 1 tablet (500 mg total) by mouth 2 (two) times daily. 03/08/17   Katheren Shams, DO  sertraline (ZOLOFT) 50 MG tablet Take 1 tablet (50 mg total) by mouth daily. 03/03/17   Katheren Shams, DO  Positive ROS: All other systems have been reviewed and were otherwise negative with the exception of those mentioned in the HPI and as above.  Physical Exam: General: Alert, no acute distress Cardiovascular: No pedal edema Respiratory: No cyanosis, no use of accessory musculature GI: abdomen soft Skin: No lesions in the area of chief complaint Neurologic: Sensation intact distally Psychiatric: Patient is competent for consent with normal mood and affect Lymphatic: no lymphedema  MUSCULOSKELETAL: exam stable  Assessment: left trigger thumb  Plan: Plan for Procedure(s): RELEASE TRIGGER FINGER LEFT THUMB  The risks benefits and  alternatives were discussed with the patient including but not limited to the risks of nonoperative treatment, versus surgical intervention including infection, bleeding, nerve injury,  blood clots, cardiopulmonary complications, morbidity, mortality, among others, and they were willing to proceed.   Eduard Roux, MD   05/26/2017 9:27 AM

## 2017-05-26 NOTE — Op Note (Signed)
   Date of Surgery: 05/26/2017  INDICATIONS: Melanie Phillips is a 51 y.o.-year-old female who presents for surgical treatment of a left thumb trigger finger;  The patient did consent to the procedure after discussion of the risks and benefits.  PREOPERATIVE DIAGNOSIS:  1. left thumb trigger finger 2. Left thumb FPL longitudinal tear 3. Left thumb FPL tenosynovitis  POSTOPERATIVE DIAGNOSIS: Same.  PROCEDURE:  1. Incision of A1 pulley for trigger finger release, left thumb 2. Repair of longitudinal tear of FPL tendon 3. Tenolysis of left FPL tendon  SURGEON: N. Eduard Roux, M.D.  ASSIST: none.  ANESTHESIA:  Bier block  IV FLUIDS AND URINE: See anesthesia.  ESTIMATED BLOOD LOSS: minimal mL  COMPLICATIONS: None.  DESCRIPTION OF PROCEDURE: The patient was identified in the preoperative holding area. The operative site was marked by the surgeon confirmed with the patient. He is brought back to the operating room. She was placed supine on table. A nonsterile tourniquet was placed on the upper forearm. The extremity was exsanguinated using Esmarch bandage and the tourniquet was inflated to 250 mmHg. The Bier block was administered. The operative extremity was prepped and draped in standard sterile fashion. Timeout was performed. Antibiotics were given. Timeout was performed. A horizontal incision based in the palmar crease in line with the thumb was used. Blunt dissection was taken down to the level of the flexor tendon. The neurovascular bundles were identified on each side of the tendon sheath and protected. The proximal edge of the A1 pulley was identified. This was sharply incised. The tendon did exhibit a small longitudinal tear which was repair using a running 4.0 vicryl. The A1 pulley was incised along its full width. Care was taken not to violate the A2 pulley. The palmar pulley was then visualized and released also. Tenolysis of the FPL tendon was also performed for the tenosynovitis.  The  tourniquet was then deflated and hemostasis was obtained. Local anesthesia was infiltrated. The wound was thoroughly irrigated and closed with 3-0 nylon sutures. Sterile dressings were applied and the hand was placed in a soft dressing. Patient tolerated the procedure well and was taken to the  PACU in stable condition.  POSTOPERATIVE PLAN: Patient will be weight bearing as tolerated and to avoid heavy lifting for 4 weeks.    Melanie Cecil, MD [place] 9:49 AM

## 2017-05-26 NOTE — Discharge Instructions (Signed)
Tenosynovitis Tenosynovitis is inflammation of a tendon and the sleeve of tissue that covers the tendon (tendon sheath). A tendon is a cord of tissue that connects muscle to bone. Normally, a tendon slides smoothly inside its tendon sheath. Tenosynovitis limits movement of the tendon and surrounding tissues, which may cause pain and stiffness. Tenosynovitis can affect any tendon and tendon sheath. Commonly affected areas include tendons in the:  Shoulder.  Arm.  Hand.  Hip.  Leg.  Foot.  What are the causes? The main cause of this condition is wear and tear over time that results in slight tears in the tendon. Other possible causes include:  A sudden injury to the tendon or tendon sheath.  A disease that causes inflammation in the body.  An infection that spreads to the tendon and tendon sheath from a skin wound.  An infection in another part of the body that spreads to the tendon and tendon sheath through the blood.  What increases the risk? The following factors may make you more likely to develop this condition:  Having rheumatoid arthritis, gout, or diabetes.  Using IV drugs.  Doing physical activities that can cause tendon overuse and stress.  Having gonorrhea.  What are the signs or symptoms? Symptoms of this condition depend on the cause. Symptoms may include:  Pain with movement.  Pain and tenderness when pressing on the tendon and tendon sheath.  Swelling.  Stiffness.  If tenosynovitis is caused by an infection, symptoms may also include:  Fever.  Redness.  Warmth.  How is this diagnosed? This condition may be diagnosed based on your medical history and a physical exam. You also may have:  Blood tests.  Imaging tests, such as: ? MRI. ? Ultrasound.  A sample of fluid removed from inside the tendon sheath to be checked in a lab.  How is this treated? Treatment for this condition depends on the cause. If tenosynovitis is not caused by an  infection, treatment may include:  Resting the tendon.  Keeping the tendon in place for periods of time (immobilization) in a splint, brace, or sling.  NSAIDs to reduce pain and swelling.  A shot (injection) of medicine to help reduce pain and swelling (steroid).  Icing or applying heat to the affected area.  Physical therapy.  Surgery to release the tendon in the sheath or to repair damage to the tendon or tendon sheath. Surgery may be done if other treatments do not help relieve symptoms.  If tenosynovitis is caused by infection, treatment may include antibiotic medicine given through an IV. In some cases, surgery may be needed to drain fluid from the tendon sheath or to remove the tendon sheath. Follow these instructions at home: If you have a splint, brace, or sling:   Wear the splint, brace, or sling as told by your health care provider. Remove it only as told by your health care provider.  Loosen the splint, brace, or sling if your fingers or toes tingle, become numb, or turn cold and blue.  Do not let your splint or brace get wet if it is not waterproof. ? Do not take baths, swim, or use a hot tub until your health care provider approves. Ask your health care provider if you can take showers. ? If your splint, brace, or sling is not waterproof, cover it with a watertight covering when you take a bath or a shower.  Keep the splint, brace, or sling clean. Managing pain, stiffness, and swelling  If directed,  apply ice to the affected area. ? Put ice in a plastic bag. ? Place a towel between your skin and the bag. ? Leave the ice on for 20 minutes, 2-3 times a day.  Move the fingers or toes of the affected limb often, if this applies. This can help to prevent stiffness and lessen swelling.  If directed, raise (elevate) the affected area above the level of your heart while you are sitting or lying down.  If directed, apply heat to the affected area as often as told by your  health care provider. Use the heat source that your health care provider recommends, such as a moist heat pack or a heating pad. ? Place a towel between your skin and the heat source. ? Leave the heat on for 20-30 minutes. ? Remove the heat if your skin turns bright red. This is especially important if you are unable to feel pain, heat, or cold. You may have a greater risk of getting burned. Driving  Do not drive or operate heavy machinery while taking prescription pain medicine.  Ask your health care provider when it is safe to drive if you have a splint or brace on any part of your arm or leg. Activity  Return to your normal activities as told by your health care provider. Ask your health care provider what activities are safe for you.  Rest the affected area as told by your health care provider.  Avoid using the affected area while you are having symptoms of tenosynovitis.  If physical therapy was prescribed, do exercises as told by your health care provider. Safety  Do not use the injured limb to support your body weight until your health care provider says that you can. General instructions  Take over-the-counter and prescription medicines only as told by your health care provider.  Keep all follow-up visits as told by your health care provider. This is important. Contact a health care provider if:  Your symptoms are not improving or are getting worse.  You have a fever and more of any of the following symptoms: ? Pain. ? Redness. ? Warmth. ? Swelling. Get help right away if:  Your fingers or toes become numb or turn blue. This information is not intended to replace advice given to you by your health care provider. Make sure you discuss any questions you have with your health care provider. Document Released: 11/16/2005 Document Revised: 07/16/2016 Document Reviewed: 09/25/2015 Elsevier Interactive Patient Education  2018 [employer].   Trigger Finger Trigger finger  (stenosing tenosynovitis) is a condition that causes a finger to get stuck in a bent position. Each finger has a tough, cord-like tissue that connects muscle to bone (tendon), and each tendon is surrounded by a tunnel of tissue (tendon sheath). To move your finger, your tendon needs to slide freely through the sheath. Trigger finger happens when the tendon or the sheath thickens, making it difficult to move your finger. Trigger finger can affect any finger or a thumb. It may affect more than one finger. Mild cases may clear up with rest and medicine. Severe cases require more treatment. What are the causes? Trigger finger is caused by a thickened finger tendon or tendon sheath. The cause of this thickening is not known. What increases the risk? The following factors may make you more likely to develop this condition:  Doing activities that require a strong grip.  Having rheumatoid arthritis, gout, or diabetes.  Being 42-42 years old.  Being a woman.  What are the signs or symptoms? Symptoms of this condition include:  Pain when bending or straightening your finger.  Tenderness or swelling where your finger attaches to the palm of your hand.  A lump in the palm of your hand or on the inside of your finger.  Hearing a popping sound when you try to straighten your finger.  Feeling a popping, catching, or locking sensation when you try to straighten your finger.  Being unable to straighten your finger.  How is this diagnosed? This condition is diagnosed based on your symptoms and a physical exam. How is this treated? This condition may be treated by:  Resting your finger and avoiding activities that make symptoms worse.  Wearing a finger splint to keep your finger in a slightly bent position.  Taking NSAIDs to relieve pain and swelling.  Injecting medicine (steroids) into the tendon sheath to reduce swelling and irritation. Injections may need to be repeated.  Having surgery to  open the tendon sheath. This may be done if other treatments do not work and you cannot straighten your finger. You may need physical therapy after surgery.  Follow these instructions at home:  Use moist heat to help reduce pain and swelling as told by your health care provider.  Rest your finger and avoid activities that make pain worse. Return to normal activities as told by your health care provider.  If you have a splint, wear it as told by your health care provider.  Take over-the-counter and prescription medicines only as told by your health care provider.  Keep all follow-up visits as told by your health care provider. This is important. Contact a health care provider if:  Your symptoms are not improving with home care. Summary  Trigger finger (stenosing tenosynovitis) causes your finger to get stuck in a bent position, and it can make it difficult and painful to straighten your finger.  This condition develops when a finger tendon or tendon sheath thickens.  Treatment starts with resting, wearing a splint, and taking NSAIDs.  In severe cases, surgery to open the tendon sheath may be needed. This information is not intended to replace advice given to you by your health care provider. Make sure you discuss any questions you have with your health care provider. Document Released: 09/05/2004 Document Revised: 10/27/2016 Document Reviewed: 10/27/2016 Elsevier Interactive Patient Education  2017 [employer] Postoperative instructions:  Weightbearing instructions: as tolerated  Keep your dressing and/or splint clean and dry at all times.  You can remove your dressing on post-operative day #3 and change with a dry/sterile dressing or Band-Aids as needed thereafter.    Incision instructions:  Do not soak your incision for 3 weeks after surgery.  If the incision gets wet, pat dry and do not scrub the incision.  Pain control:  You have been given a prescription to be taken as  directed for post-operative pain control.  In addition, elevate the operative extremity above the heart at all times to prevent swelling and throbbing pain.  Take over-the-counter Colace, 100mg  by mouth twice a day while taking narcotic pain medications to help prevent constipation.  Follow up appointments: 1) 10-14 days for suture removal and wound check. 2) Dr. Erlinda Hong as scheduled.   -------------------------------------------------------------------------------------------------------------  After Surgery Pain Control:  After your surgery, post-surgical discomfort or pain is likely. This discomfort can last several days to a few weeks. At certain times of the day your discomfort may be more intense.  Did you receive a nerve  block?  A nerve block can provide pain relief for one hour to two days after your surgery. As long as the nerve block is working, you will experience little or no sensation in the area the surgeon operated on.  As the nerve block wears off, you will begin to experience pain or discomfort. It is very important that you begin taking your prescribed pain medication before the nerve block fully wears off. Treating your pain at the first sign of the block wearing off will ensure your pain is better controlled and more tolerable when full-sensation returns. Do not wait until the pain is intolerable, as the medicine will be less effective. It is better to treat pain in advance than to try and catch up.  General Anesthesia:  If you did not receive a nerve block during your surgery, you will need to start taking your pain medication shortly after your surgery and should continue to do so as prescribed by your surgeon.  Pain Medication:  Most commonly we prescribe Vicodin and Percocet for post-operative pain. Both of these medications contain a combination of acetaminophen (Tylenol) and a narcotic to help control pain.   It takes between 30 and 45 minutes before pain medication starts  to work. It is important to take your medication before your pain level gets too intense.   Nausea is a common side effect of many pain medications. You will want to eat something before taking your pain medicine to help prevent nausea.   If you are taking a prescription pain medication that contains acetaminophen, we recommend that you do not take additional over the counter acetaminophen (Tylenol).  Other pain relieving options:   Using a cold pack to ice the affected area a few times a day (15 to 20 minutes at a time) can help to relieve pain, reduce swelling and bruising.   Elevation of the affected area can also help to reduce pain and swelling.      Regional Anesthesia Blocks  1. Numbness or the inability to move the "blocked" extremity may last from 3-48 hours after placement. The length of time depends on the medication injected and your individual response to the medication. If the numbness is not going away after 48 hours, call your surgeon.  2. The extremity that is blocked will need to be protected until the numbness is gone and the  Strength has returned. Because you cannot feel it, you will need to take extra care to avoid injury. Because it may be weak, you may have difficulty moving it or using it. You may not know what position it is in without looking at it while the block is in effect.  3. For blocks in the legs and feet, returning to weight bearing and walking needs to be done carefully. You will need to wait until the numbness is entirely gone and the strength has returned. You should be able to move your leg and foot normally before you try and bear weight or walk. You will need someone to be with you when you first try to ensure you do not fall and possibly risk injury.  4. Bruising and tenderness at the needle site are common side effects and will resolve in a few days.  5. Persistent numbness or new problems with movement should be communicated to the surgeon or the  [facility] [phone] [place] [phone]).

## 2017-05-26 NOTE — Transfer of Care (Signed)
Immediate Anesthesia Transfer of Care Note  Patient: Melanie Phillips  Procedure(s) Performed: Procedure(s): RELEASE TRIGGER FINGER LEFT THUMB (Left)  Patient Location: PACU  Anesthesia Type:Bier block  Level of Consciousness: awake, oriented and patient cooperative  Airway & Oxygen Therapy: Patient Spontanous Breathing and Patient connected to face mask oxygen  Post-op Assessment: Report given to RN and Post -op Vital signs reviewed and stable  Post vital signs: Reviewed and stable  Last Vitals:  Vitals:   05/26/17 0836  BP: (!) 153/90  Pulse: 72  Resp: 12  Temp: 36.8 C    Last Pain:  Vitals:   05/26/17 0836  TempSrc: Oral         Complications: No apparent anesthesia complications

## 2017-05-26 NOTE — Anesthesia Postprocedure Evaluation (Signed)
Anesthesia Post Note  Patient: Melanie Phillips  Procedure(s) Performed: Procedure(s) (LRB): RELEASE TRIGGER FINGER LEFT THUMB (Left)     Patient location during evaluation: PACU Anesthesia Type: Bier Block Level of consciousness: awake and alert Pain management: pain level controlled Vital Signs Assessment: post-procedure vital signs reviewed and stable Respiratory status: spontaneous breathing, nonlabored ventilation and respiratory function stable Cardiovascular status: stable and blood pressure returned to baseline Anesthetic complications: no    Last Vitals:  Vitals:   05/26/17 1025 05/26/17 1043  BP:  (!) 159/95  Pulse: 74 60  Resp: 15 16  Temp:  36.5 C    Last Pain:  Vitals:   05/26/17 1043  TempSrc: Oral  PainSc: 8                  Catalina Gravel

## 2017-05-26 NOTE — Anesthesia Preprocedure Evaluation (Addendum)
Anesthesia Evaluation  Patient identified by MRN, date of birth, ID band Patient awake    Reviewed: Allergy & Precautions, NPO status , Patient's Chart, lab work & pertinent test results  Airway Mallampati: II  TM Distance: >3 FB Neck ROM: Full    Dental  (+) Teeth Intact, Dental Advisory Given, Missing,    Pulmonary Current Smoker,    Pulmonary exam normal breath sounds clear to auscultation       Cardiovascular hypertension, Pt. on medications Normal cardiovascular exam Rhythm:Regular Rate:Normal     Neuro/Psych PSYCHIATRIC DISORDERS Anxiety Depression negative neurological ROS     GI/Hepatic Neg liver ROS, GERD  Medicated,  Endo/Other  diabetes, Type 2, Oral Hypoglycemic AgentsObesity   Renal/GU negative Renal ROS     Musculoskeletal  (+) Arthritis , Osteoarthritis,    Abdominal   Peds  Hematology negative hematology ROS (+)   Anesthesia Other Findings Day of surgery medications reviewed with the patient.  Reproductive/Obstetrics                            Anesthesia Physical Anesthesia Plan  ASA: II  Anesthesia Plan: Bier Block   Post-op Pain Management:    Induction: Intravenous  PONV Risk Score and Plan: 1 and Ondansetron  Airway Management Planned: Nasal Cannula  Additional Equipment:   Intra-op Plan:   Post-operative Plan:   Informed Consent: I have reviewed the patients History and Physical, chart, labs and discussed the procedure including the risks, benefits and alternatives for the proposed anesthesia with the patient or authorized representative who has indicated his/her understanding and acceptance.   Dental advisory given  Plan Discussed with:   Anesthesia Plan Comments: (Risks/benefits of regional block discussed with patient including risk of bleeding, infection, nerve damage, and possibility of failed block.  Also discussed backup plan of general  anesthesia and associated risks.  Patient wishes to proceed.)        Anesthesia Quick Evaluation

## 2017-05-26 NOTE — Anesthesia Procedure Notes (Signed)
Anesthesia Regional Block: Bier block (IV Regional)   Pre-Anesthetic Checklist: ,, timeout performed, Correct Patient, Correct Site, Correct Laterality, Correct Procedure,, site marked, surgical consent,, at surgeon's request Needles:  Injection technique: Single-shot  Needle Type: Other      Needle Gauge: 20     Additional Needles:   Procedures:,,,,,,, Esmarch exsanguination, single tourniquet utilized,  Narrative:  Start time: 05/26/2017 9:34 AM End time: 05/26/2017 9:35 AM  Performed by: Personally

## 2017-05-28 ENCOUNTER — Encounter (HOSPITAL_BASED_OUTPATIENT_CLINIC_OR_DEPARTMENT_OTHER): Payer: Self-pay | Admitting: Orthopaedic Surgery

## 2017-06-07 LAB — POCT HEMOGLOBIN-HEMACUE: Hemoglobin: 16 g/dL — ABNORMAL HIGH (ref 12.0–15.0)

## 2017-06-08 ENCOUNTER — Ambulatory Visit (INDEPENDENT_AMBULATORY_CARE_PROVIDER_SITE_OTHER): Payer: Self-pay | Admitting: Orthopaedic Surgery

## 2017-06-08 DIAGNOSIS — M65312 Trigger thumb, left thumb: Secondary | ICD-10-CM

## 2017-06-08 MED ORDER — HYDROCODONE-ACETAMINOPHEN 7.5-325 MG PO TABS: 1.0000 | ORAL_TABLET | Freq: Four times a day (QID) | ORAL | 0 refills | Status: DC | PRN

## 2017-06-08 NOTE — Progress Notes (Signed)
Patient is 2 weeks status post left trigger thumb release. She is doing well overall. She no longer has triggering. Her incision has healed without any signs of infection. Thumb motion is as expected. The sutures were removed. Increase activity and hand use as tolerated. Questions encouraged and answered. Follow-up with me in 4 weeks.

## 2017-06-11 ENCOUNTER — Encounter (HOSPITAL_BASED_OUTPATIENT_CLINIC_OR_DEPARTMENT_OTHER): Payer: Self-pay | Admitting: Orthopaedic Surgery

## 2017-06-11 NOTE — Anesthesia Postprocedure Evaluation (Signed)
Anesthesia Post Note  Patient: Melanie Phillips  Procedure(s) Performed: Procedure(s) (LRB): Right thumb Ligament Reconstruction Tendon Interposition, Right Carpal tunnel release (Right) CARPAL TUNNEL RELEASE (Right)     Anesthesia Post Evaluation  Last Vitals:  Vitals:   03/10/17 1137 03/10/17 1208  BP:  (!) 130/94  Pulse: 85 72  Resp: 16 18  Temp:  36.4 C    Last Pain:  Vitals:   03/10/17 1208  TempSrc:   PainSc: 0-No pain                 Kenna Seward DAVID

## 2017-06-11 NOTE — Addendum Note (Signed)
Addendum  created 06/11/17 1710 by Lillia Abed, MD   Sign clinical note

## 2017-06-17 ENCOUNTER — Other Ambulatory Visit: Payer: Self-pay

## 2017-06-17 ENCOUNTER — Ambulatory Visit (HOSPITAL_COMMUNITY): Payer: Self-pay

## 2017-07-06 ENCOUNTER — Ambulatory Visit (INDEPENDENT_AMBULATORY_CARE_PROVIDER_SITE_OTHER): Payer: Self-pay | Admitting: Orthopaedic Surgery

## 2017-07-06 DIAGNOSIS — M65312 Trigger thumb, left thumb: Secondary | ICD-10-CM

## 2017-07-06 DIAGNOSIS — M79641 Pain in right hand: Secondary | ICD-10-CM | POA: Insufficient documentation

## 2017-07-06 DIAGNOSIS — G5601 Carpal tunnel syndrome, right upper limb: Secondary | ICD-10-CM

## 2017-07-06 MED ORDER — DICLOFENAC SODIUM 75 MG PO TBEC: 75.0000 mg | DELAYED_RELEASE_TABLET | Freq: Two times a day (BID) | ORAL | 2 refills | Status: DC

## 2017-07-06 NOTE — Progress Notes (Signed)
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   Her thumb is doing well and she has no complaints. Her right hand has pain throughout that's worse with cold weather and weather changes. She denies any numbness and tinging. Her exam of both hands are relatively benign. fully healed surgical scars. Sample of Pennsaid was given today. Diclofenac was also prescribed.

## 2017-08-16 ENCOUNTER — Encounter: Payer: Self-pay | Admitting: Family Medicine

## 2017-08-16 ENCOUNTER — Ambulatory Visit (INDEPENDENT_AMBULATORY_CARE_PROVIDER_SITE_OTHER): Payer: Self-pay | Admitting: Family Medicine

## 2017-08-16 ENCOUNTER — Telehealth: Payer: Self-pay

## 2017-08-16 VITALS — BP 150/89 | HR 103 | Temp 98.3°F | Ht 69.0 in | Wt 254.0 lb

## 2017-08-16 DIAGNOSIS — M79604 Pain in right leg: Secondary | ICD-10-CM | POA: Insufficient documentation

## 2017-08-16 DIAGNOSIS — M79671 Pain in right foot: Secondary | ICD-10-CM | POA: Insufficient documentation

## 2017-08-16 NOTE — Telephone Encounter (Signed)
Called patient and informed her of appointment at [facility] for Ultrasound on 09/18/22018. She was told to show up at 1245 for a 1300 appointment time to admittance on the first floor. Patient had no problems.Melanie Phillips

## 2017-08-16 NOTE — Telephone Encounter (Signed)
Called and attempted to schedule an Ultra Sound for patient and no answer. I will continue to call until I get an anwer.[name]

## 2017-08-16 NOTE — Patient Instructions (Signed)
   It was good to see you today, please go get the ultrasound of your leg.  If the [place] is negative I think this a muscle strain. Please use tylenol and ibuprofen for pain relief. Please do the stretches I showed you. You can use ice or heat to help with pain relief as well.  If you have questions or concerns please do not hesitate to call at [phone].  Lucila Maine, DO PGY-2, [facility] 08/16/2017 12:10 PM

## 2017-08-16 NOTE — Assessment & Plan Note (Signed)
  Acute onset 2 weeks ago. Most likely MSK pain but concerning for DVT given larger calf circumference and positive homan sign on right.   -LE [place] ordered stat outpatient -will follow up with pt regarding results -if [place] negative advised patient likely MSK and to rest, ice or heat, take tylenol or ibuprofen for pain

## 2017-08-16 NOTE — Progress Notes (Signed)
     Subjective:    Patient ID: Melanie Phillips, female    DOB: 1966-04-06, 51 y.o.   MRN: [id number]   CC: right leg pain  Patient present for same day appointment for right leg pain. Has had right leg pain for the past 2 weeks. The pain began suddenly without inciting event. The pain is in her right calf. She describes it as a constant ache. It does not radiate anywhere. Nothing helps it. She takes ibuprofen normally for other issues and this has not helped. She tried her daughter's percocet with some relief of the pain. Denies injury or trauma to area. She denies chest pain or shortness of breath. Has never had a blood clot or PE. She did have recent trigger finger surgery 3 weeks ago. Was not bed bound afterward. Healing normally.   Smoking status reviewed- current every day smoker  Review of Systems- see HPI   Objective:  BP (!) 150/89 (BP Location: Left Arm, Patient Position: Sitting, Cuff Size: Large)   Pulse (!) 103   Temp 98.3 F (36.8 C) (Oral)   Ht 5\' 9"  (1.753 m)   Wt 254 lb (115.2 kg)   LMP 06/23/2013   SpO2 99%   BMI 37.51 kg/m  Vitals and nursing note reviewed  General: well nourished, in no acute distress Cardiac: regular rhythm, tachycardic, clear S1 and S2, no murmurs, rubs, or gallops Respiratory: clear to auscultation bilaterally, no increased work of breathing Extremities: right calf tender to palpation, positive homans sign on right. Right calf 16.5 cm in diameter, left calf 15 cm. No increased warmth. Some varicose veins noted on posterior LE bilaterally Skin: warm and dry, no rashes noted Neuro: alert and oriented, no focal deficits  Assessment & Plan:    Right leg pain  Acute onset 2 weeks ago. Most likely MSK pain but concerning for DVT given larger calf circumference and positive homan sign on right.   -LE [place] ordered stat outpatient -will follow up with pt regarding results -if [place] negative advised patient likely MSK and to rest, ice or heat, take  tylenol or ibuprofen for pain  Return if symptoms worsen or fail to improve.  Lucila Maine, DO Family Medicine Resident PGY-2

## 2017-08-17 ENCOUNTER — Telehealth: Payer: Self-pay | Admitting: Family Medicine

## 2017-08-17 ENCOUNTER — Ambulatory Visit (HOSPITAL_COMMUNITY)
Admission: RE | Admit: 2017-08-17 | Discharge: 2017-08-17 | Disposition: A | Discharge status: Home / Self Care | Payer: Self-pay | Source: Ambulatory Visit | Attending: Family Medicine | Admitting: Family Medicine

## 2017-08-17 ENCOUNTER — Ambulatory Visit (HOSPITAL_COMMUNITY): Admission: RE | Admit: 2017-08-17 | Payer: Self-pay | Source: Ambulatory Visit

## 2017-08-17 DIAGNOSIS — M79604 Pain in right leg: Secondary | ICD-10-CM | POA: Insufficient documentation

## 2017-08-17 NOTE — Telephone Encounter (Signed)
Disability hearing form dropped off for at front desk for completion.  Verified that patient section of form has been completed.  Last DOS with PCP was 08/16/17.  Placed form in blue team folder to be completed by clinical staff.  Carmina Miller

## 2017-08-17 NOTE — Progress Notes (Signed)
VASCULAR LAB PRELIMINARY  PRELIMINARY  PRELIMINARY  PRELIMINARY  Right lower extremity venous duplex completed.    Preliminary report:  There is no DVT or SVT noted in the right lower extremity.  Left voicemail on triage line.  Nechuma Boven, RVT 08/17/2017, 1:43 PM

## 2017-08-17 NOTE — Telephone Encounter (Signed)
Clinical info completed on disability form.  Place form in Dr. Stefano Gaul box for completion.  Melanie Phillips, [name]

## 2017-08-20 NOTE — Progress Notes (Signed)
Patient was made aware of results while at the hospital. [facility]

## 2017-08-23 NOTE — Telephone Encounter (Signed)
Pt has been scheduled with pcp for 10/1.

## 2017-08-23 NOTE — Telephone Encounter (Signed)
Patient's form requires an evaluation in person to fill out. Unable to answer these questions without patient. Please have her schedule an appointment. Thank you.

## 2017-08-30 ENCOUNTER — Ambulatory Visit (INDEPENDENT_AMBULATORY_CARE_PROVIDER_SITE_OTHER): Payer: Self-pay | Admitting: Family Medicine

## 2017-08-30 ENCOUNTER — Encounter: Payer: Self-pay | Admitting: Family Medicine

## 2017-08-30 DIAGNOSIS — Z23 Encounter for immunization: Secondary | ICD-10-CM

## 2017-08-30 NOTE — Progress Notes (Signed)
  Patient presented today for visit, received flu vaccine, left before being seen due to "emergency phone call". Will ask her to reschedule visit at her leisure.   Lucila Maine, DO PGY-2, [facility] 08/30/2017 11:04 AM

## 2017-09-03 ENCOUNTER — Ambulatory Visit: Payer: Self-pay | Admitting: Family Medicine

## 2017-09-10 ENCOUNTER — Ambulatory Visit: Payer: Self-pay | Admitting: Family Medicine

## 2017-09-13 ENCOUNTER — Ambulatory Visit (INDEPENDENT_AMBULATORY_CARE_PROVIDER_SITE_OTHER): Payer: Self-pay | Admitting: Family Medicine

## 2017-09-13 ENCOUNTER — Encounter: Payer: Self-pay | Admitting: Family Medicine

## 2017-09-13 ENCOUNTER — Other Ambulatory Visit (HOSPITAL_COMMUNITY): Payer: Self-pay | Admitting: *Deleted

## 2017-09-13 VITALS — BP 138/78 | HR 89 | Temp 97.7°F | Ht 69.5 in | Wt 250.0 lb

## 2017-09-13 DIAGNOSIS — N6320 Unspecified lump in the left breast, unspecified quadrant: Secondary | ICD-10-CM

## 2017-09-13 DIAGNOSIS — F17200 Nicotine dependence, unspecified, uncomplicated: Secondary | ICD-10-CM

## 2017-09-13 DIAGNOSIS — Z1211 Encounter for screening for malignant neoplasm of colon: Secondary | ICD-10-CM

## 2017-09-13 DIAGNOSIS — Z0000 Encounter for general adult medical examination without abnormal findings: Secondary | ICD-10-CM

## 2017-09-13 DIAGNOSIS — M2550 Pain in unspecified joint: Secondary | ICD-10-CM

## 2017-09-13 NOTE — Assessment & Plan Note (Addendum)
  Referral placed to GI to get colonoscopy done, patient has orange card and unsure if she will be eligible to get this service via cone financial assistance. Scheduled for mammogram on Nov 1

## 2017-09-13 NOTE — Assessment & Plan Note (Signed)
  Current every day smoker.  -encouraged cessation.

## 2017-09-13 NOTE — Assessment & Plan Note (Signed)
  Patient requesting disability paperwork to be completed due to this, mainly right leg/knee pain. Normal physical exam. Subjective 10/10 pain in right leg/knee. Follows with ortho.  -will fill out paperwork based on my objective exam findings -advised follow up with orthopedics -referred to PT as she states this has been helpful in past for right leg pain, unsure if orange card will cover this.

## 2017-09-13 NOTE — Progress Notes (Signed)
    Subjective:    Patient ID: Melanie Phillips, female    DOB: 09/25/1966, 51 y.o.   MRN: [id number]   CC: follow up disability paper work  Patient here today to get disability paperwork completed. Patient has not worked for 5 years. She states this is secondary to her chronic right leg sciatica and right knee OA that make her unable to work. She is unable to stay in 1 position for long- has to alternate between laying, sitting, and walking to avoid pain. She has seen sports med and orthopedic surgery for this in past. Prescribed norco from ortho. She used to get PT when she had medicaid but it was no longer covered. Currently has orange card.   She has depression, is well managed on zoloft. She states her pain is her main contributor to depression.   She is due for a colonoscopy.  Smoking status reviewed- current every day smoker  Review of Systems- no fevers, chills, SOB, chest pain. Endorses right knee pain.    Objective:  BP 138/78   Pulse 89   Temp 97.7 F (36.5 C) (Oral)   Ht 5' 9.5" (1.765 m)   Wt 250 lb (113.4 kg)   LMP 06/23/2013   SpO2 99%   BMI 36.39 kg/m  Vitals and nursing note reviewed  General: well nourished, in no acute distress HEENT: normocephalic, no scleral icterus or conjunctival pallor, no nasal discharge, moist mucous membranes, good dentition without erythema or discharge noted in posterior oropharynx Neck: supple, non-tender, without lymphadenopathy Cardiac: RRR, clear S1 and S2, no murmurs, rubs, or gallops Respiratory: clear to auscultation bilaterally, no increased work of breathing Abdomen: obese abdomen. Soft, nontender, nondistended, no masses or organomegaly. Bowel sounds present Extremities: no edema or cyanosis. Skin: warm and dry, no rashes noted Neuro: alert and oriented, no focal deficits. Strength 5/5 in upper and lower extremities bilaterally. Normal gait without use of assistive device.   Assessment & Plan:    Healthcare  maintenance  Referral placed to GI to get colonoscopy done, patient has orange card and unsure if she will be eligible to get this service via cone financial assistance. Scheduled for mammogram on Nov 1   PAIN IN JOINT, MULTIPLE SITES  Patient requesting disability paperwork to be completed due to this, mainly right leg/knee pain. Normal physical exam. Subjective 10/10 pain in right leg/knee. Follows with ortho.  -will fill out paperwork based on my objective exam findings -advised follow up with orthopedics -referred to PT as she states this has been helpful in past for right leg pain, unsure if orange card will cover this.  TOBACCO ABUSE  Current every day smoker.  -encouraged cessation.    Return in about 3 months (around 12/14/2017), or if symptoms worsen or fail to improve.   Lucila Maine, DO Family Medicine Resident PGY-2

## 2017-09-13 NOTE — Patient Instructions (Signed)
   It was nice to see you today!  I will complete your form and send it to the disability office.  I have put in referrals for GI to get a colonoscopy and PT to resume physical therapy, if insurance allows.  If you have questions or concerns please do not hesitate to call at [phone].  Lucila Maine, DO PGY-2, [facility] 09/13/2017 9:33 AM

## 2017-09-21 ENCOUNTER — Ambulatory Visit: Payer: Self-pay | Attending: Family Medicine | Admitting: Physical Therapy

## 2017-09-21 ENCOUNTER — Encounter: Payer: Self-pay | Admitting: Physical Therapy

## 2017-09-21 DIAGNOSIS — R2689 Other abnormalities of gait and mobility: Secondary | ICD-10-CM | POA: Insufficient documentation

## 2017-09-21 DIAGNOSIS — M25561 Pain in right knee: Secondary | ICD-10-CM | POA: Insufficient documentation

## 2017-09-21 DIAGNOSIS — M25661 Stiffness of right knee, not elsewhere classified: Secondary | ICD-10-CM | POA: Insufficient documentation

## 2017-09-21 DIAGNOSIS — G8929 Other chronic pain: Secondary | ICD-10-CM | POA: Insufficient documentation

## 2017-09-21 DIAGNOSIS — M6281 Muscle weakness (generalized): Secondary | ICD-10-CM | POA: Insufficient documentation

## 2017-09-21 NOTE — Therapy (Signed)
[place], [place], [address] Phone: [phone]   Fax:  [phone]  Physical Therapy Evaluation  Patient Details  Name: Melanie Phillips MRN: [id number] Date of Birth: Apr 21, 1966 Referring Provider: Kinnie Feil, MD  Encounter Date: 09/21/2017      PT End of Session - 09/21/17 1244    Visit Number 1   Number of Visits 13   Date for PT Re-Evaluation 11/02/17   Authorization Type CAFA   PT Start Time 1152   PT Stop Time 1233   PT Time Calculation (min) 41 min   Activity Tolerance Patient tolerated treatment well   Behavior During Therapy [facility] for tasks assessed/performed      Past Medical History:  Diagnosis Date  . Anxiety   . Arthritis   . Back pain   . Depression   . Diabetes mellitus without complication ([place])   . GERD (gastroesophageal reflux disease)   . Hyperlipidemia   . Hypertension     Past Surgical History:  Procedure Laterality Date  . CARPAL TUNNEL RELEASE Right 03/10/2017   Procedure: CARPAL TUNNEL RELEASE;  Surgeon: Leandrew Koyanagi, MD;  Location: [facility];  Service: Orthopedics;  Laterality: Right;  . CARPOMETACARPEL SUSPENSION PLASTY Right 03/10/2017   Procedure: Right thumb Ligament Reconstruction Tendon Interposition, Right Carpal tunnel release;  Surgeon: Leandrew Koyanagi, MD;  Location: [facility];  Service: Orthopedics;  Laterality: Right;  . TRIGGER FINGER RELEASE Left 05/26/2017   Procedure: RELEASE TRIGGER FINGER LEFT THUMB;  Surgeon: Leandrew Koyanagi, MD;  Location: [facility];  Service: Orthopedics;  Laterality: Left;  . TUBAL LIGATION      There were no vitals filed for this visit.       Subjective Assessment - 09/21/17 1157    Subjective pt is a 51 y.o F with CC of R knee pain that has been going on for 10 years reporting she slipped about 15 years ago and bruised the tissues in the knee. since onset the pain has  worsened and pain refers to the R foot which she reports may be due to her sciatica. reports popping, clicking and grinding, reports intermittent buckling. pain in the knee limits sleeping at night   Limitations Sitting;Lifting   How long can you sit comfortably? 10-12 min   How long can you stand comfortably? 15 min   How long can you walk comfortably? 5-6 min   Diagnostic tests its been a while   Patient Stated Goals to teach exercises to prevent TKA,    Currently in Pain? Yes   Pain Score 8   at worst 10/10, best 7/10   Pain Location Knee   Pain Orientation Right   Pain Descriptors / Indicators Throbbing   Pain Type Chronic pain   Pain Radiating Towards down to the ankle   Pain Onset More than a month ago   Pain Frequency Intermittent   Aggravating Factors  sitting, bending knee   Pain Relieving Factors standing, heating             [facility] PT Assessment - 09/21/17 1200      Assessment   Medical Diagnosis multipe joint pain, R sciatica   Referring Provider Kinnie Feil, MD   Onset Date/Surgical Date --  over 10 years   Hand Dominance Right   Next MD Visit --  making one PRN   Prior Therapy yes  a couple years ago  Precautions   Precautions None     Restrictions   Weight Bearing Restrictions No     Balance Screen   Has the patient fallen in the past 6 months Yes   How many times? 1   Has the patient had a decrease in activity level because of a fear of falling?  No   Is the patient reluctant to leave their home because of a fear of falling?  No     Home Environment   Living Environment Private residence   Type of [place] to enter   Entrance Stairs-Number of Steps 6   Entrance Stairs-Rails Left   Home Layout One level   [place]     Prior Function   Level of Independence Independent with basic ADLs   Vocation Unemployed  working on getting disability   Leisure unsure     Cognition   Overall Cognitive  Status Within Functional Limits for tasks assessed     Observation/Other Assessments   Focus on Therapeutic Outcomes (FOTO)  not taken at intake     Posture/Postural Control   Posture/Postural Control Postural limitations   Postural Limitations Rounded Shoulders;Forward head     ROM / Strength   AROM / PROM / Strength AROM;PROM;Strength     AROM   AROM Assessment Site Knee   Right/Left Knee Right;Left   Right Knee Extension -6   Right Knee Flexion 97   Left Knee Extension 0   Left Knee Flexion 120     PROM   PROM Assessment Site Knee   Right/Left Knee Left;Right   Right Knee Extension 0   Right Knee Flexion 107     Strength   Strength Assessment Site Hip;Knee   Right/Left Hip Right;Left   Right/Left Knee Right;Left   Right Knee Flexion 4-/5  Pain during testing   Right Knee Extension 4-/5  pain during testing   Left Knee Flexion 4/5   Left Knee Extension 4/5     Palpation   Palpation comment TTP along lateral pole of the patella, lateral joing and fibular head, distal biceps femoris and general popliteal space soreness     Special Tests    Special Tests Knee Special Tests   Knee Special tests  Patellofemoral Grind Test (Clarke's Sign);Lateral Pull Sign     Lateral Pull Sign    Findings Positive   Comments  bil with L>R     Patellofemoral Grind test (Clark's Sign)   Findings Not tested     Ambulation/Gait   Ambulation/Gait Yes   Gait Pattern Step-through pattern;Decreased stride length;Decreased stance time - right;Decreased step length - left;Antalgic;Trendelenburg            Objective measurements completed on examination: See above findings.          [facility] Adult PT Treatment/Exercise - 09/21/17 1200      Knee/Hip Exercises: Supine   Short Arc Quad Sets Strengthening;Right;10 reps;2 sets  with a ball squeeze for VMO activation     Manual Therapy   Manual Therapy Taping   Manual therapy comments MTPR over R vastus lateralis x 2   McConnell  Lateral > Medial   trial                PT Education - 09/21/17 1243    Education provided Yes   Education Details evaluation findings, POC, goals, HEP with form/ rationale, anatomy of area involved   Person(s) Educated Patient   Methods  Explanation;Handout;Verbal cues;Demonstration   Comprehension Verbalized understanding;Verbal cues required;Returned demonstration          PT Short Term Goals - 09/21/17 1252      PT SHORT TERM GOAL #1   Title pt to be I with inital HEP    Time 3   Period Weeks   Status New   Target Date 10/12/17     PT SHORT TERM GOAL #2   Title pt to verbalize techniques to reduce R knee pain and inflammation via RICE and HEP    Time 3   Period Weeks   Status New   Target Date 10/19/17           PT Long Term Goals - 09/21/17 1253      PT LONG TERM GOAL #1   Title pt to increase R knee AROM to >/= -3 to 120 degrees on the RLE for functional and efficent gait pattern with </= 2/10 pain   Time 6   Period Weeks   Status New   Target Date 11/02/17     PT LONG TERM GOAL #2   Title pt to increase strength in bil LE to >/= 4/5 to provide knee / hip stability with prolonged walking/ standing activities    Time 6   Period Weeks   Status New   Target Date 11/02/17     PT LONG TERM GOAL #3   Title pt to increase sitting/ standing and walking time to >/= 20 min with </= 2/10 pain for functional endurance required for ADLs    Time 6   Period Weeks   Status New   Target Date 11/02/17     PT LONG TERM GOAL #4   Title pt to be I with all HEP given as of last visit    Time 6   Period Weeks   Status New                Plan - 09/21/17 1244    Clinical Impression Statement pt presents to OPPT with CC or R knee pain that started over 10 years ago due to falling on it. She demonstrates limited knee AROM / PROM on the R compared bil with pain during testing. TTP along R lateral patellar pole, and lateral joint line, Following MTPR of  the R vastus lateralis and VMO activation techniques she reported decresaed pain. trialed McConnel taping whichshe reported popping and clicking but reduced pain. She would benefit from physical therapy to decrease R knee pain, improve mobility, and maximize her function by addressing the deficits listed.     History and Personal Factors relevant to plan of care: hx of diabetes, depression and HTN   Clinical Presentation Evolving   Clinical Presentation due to: chronic L knee pain, limited knee ROM, hx of falling/ knee buckling   Clinical Decision Making Moderate   Rehab Potential Good   PT Frequency 2x / week   PT Duration 6 weeks   PT Treatment/Interventions ADLs/Self Care Home Management;Cryotherapy;Electrical Stimulation;Iontophoresis 4mg /ml Dexamethasone;Moist Heat;Ultrasound;Therapeutic activities;Therapeutic exercise;Dry needling;Taping;Manual techniques;Patient/family education;Balance training;Neuromuscular re-education   PT Next Visit Plan review and update HEP PRN, assess hip/ knee strength, manual techniques for vastus lateralis, VMO activation, hip strengthening, McConnel taping   PT Home Exercise Plan VMO SAQ, hip flexor stretching, supine clams   Consulted and Agree with Plan of Care Patient      Patient will benefit from skilled therapeutic intervention in order to improve the following deficits and impairments:  Abnormal gait,  Pain, Improper body mechanics, Postural dysfunction, Decreased endurance, Decreased range of motion, Decreased strength, Decreased mobility, Decreased activity tolerance, Decreased balance  Visit Diagnosis: Chronic pain of right knee - Plan: PT plan of care cert/re-cert  Stiffness of right knee, not elsewhere classified - Plan: PT plan of care cert/re-cert  Muscle weakness (generalized) - Plan: PT plan of care cert/re-cert  Other abnormalities of gait and mobility - Plan: PT plan of care cert/re-cert     Problem List Patient Active Problem List    Diagnosis Date Noted  . Right leg pain 08/16/2017  . Tenosynovitis of finger 05/26/2017  . Trigger thumb, left thumb 05/18/2017  . Arthritis of carpometacarpal [facility]) joint of right thumb 03/10/2017  . Essential hypertension 03/04/2017  . Healthcare maintenance 03/04/2017  . Depression 03/09/2016  . Carpal tunnel syndrome 03/09/2016  . Diabetes mellitus, type 2 ([place]) 08/15/2014  . Poor dental hygiene 08/15/2014  . Sciatica 05/31/2012  . OBESITY, UNSPECIFIED 03/07/2010  . TOBACCO ABUSE 03/07/2010  . PAIN IN JOINT, MULTIPLE SITES 03/07/2010   Starr [place] PT, DPT, LAT, ATC  09/21/17  1:01 PM     [facility] [address] [place], [place], [address] Phone: [phone]   Fax:  [phone]  Name: Melanie Phillips MRN: [id number] Date of Birth: January 21, 1966

## 2017-09-24 NOTE — Telephone Encounter (Signed)
Patient aware that form is ready for pick up.  They are placed at the front desk. Jazmin Hartsell,CMA

## 2017-09-29 ENCOUNTER — Encounter: Payer: Self-pay | Admitting: Physical Therapy

## 2017-09-29 ENCOUNTER — Ambulatory Visit: Payer: Self-pay | Admitting: Physical Therapy

## 2017-09-29 DIAGNOSIS — M25661 Stiffness of right knee, not elsewhere classified: Secondary | ICD-10-CM

## 2017-09-29 DIAGNOSIS — R2689 Other abnormalities of gait and mobility: Secondary | ICD-10-CM

## 2017-09-29 DIAGNOSIS — M6281 Muscle weakness (generalized): Secondary | ICD-10-CM

## 2017-09-29 DIAGNOSIS — M25561 Pain in right knee: Principal | ICD-10-CM

## 2017-09-29 DIAGNOSIS — G8929 Other chronic pain: Secondary | ICD-10-CM

## 2017-09-29 NOTE — Therapy (Signed)
[place], [place], [address] Phone: [phone]   Fax:  [phone]  Physical Therapy Treatment  Patient Details  Name: Melanie Phillips MRN: [id number] Date of Birth: Mar 14, 1966 Referring Provider: Kinnie Feil, MD  Encounter Date: 09/29/2017      PT End of Session - 09/29/17 1213    Visit Number 1   Number of Visits 13   Date for PT Re-Evaluation 11/02/17   PT Start Time 0744   PT Stop Time 0803   PT Time Calculation (min) 19 min   Activity Tolerance Patient tolerated treatment well   Behavior During Therapy [facility] for tasks assessed/performed      Past Medical History:  Diagnosis Date  . Anxiety   . Arthritis   . Back pain   . Depression   . Diabetes mellitus without complication ([place])   . GERD (gastroesophageal reflux disease)   . Hyperlipidemia   . Hypertension     Past Surgical History:  Procedure Laterality Date  . CARPAL TUNNEL RELEASE Right 03/10/2017   Procedure: CARPAL TUNNEL RELEASE;  Surgeon: Leandrew Koyanagi, MD;  Location: [facility];  Service: Orthopedics;  Laterality: Right;  . CARPOMETACARPEL SUSPENSION PLASTY Right 03/10/2017   Procedure: Right thumb Ligament Reconstruction Tendon Interposition, Right Carpal tunnel release;  Surgeon: Leandrew Koyanagi, MD;  Location: [facility];  Service: Orthopedics;  Laterality: Right;  . TRIGGER FINGER RELEASE Left 05/26/2017   Procedure: RELEASE TRIGGER FINGER LEFT THUMB;  Surgeon: Leandrew Koyanagi, MD;  Location: [facility];  Service: Orthopedics;  Laterality: Left;  . TUBAL LIGATION      There were no vitals filed for this visit.      Subjective Assessment - 09/29/17 0811    Subjective Patient late 10 minutes.  sore stiff today.  tape helped until I took it off.   Currently in Pain? Yes   Pain Score 8    Pain Location Knee   Pain Orientation Right  also left   Pain Descriptors / Indicators  Throbbing   Pain Type Chronic pain   Aggravating Factors  morning,,  sitting,  bending lifting leg   Pain Relieving Factors heat                         [facility] Adult PT Treatment/Exercise - 09/29/17 0001      Self-Care   Self-Care Other Self-Care Comments   Other Self-Care Comments  how knee works,  orthotics,  pain control     Knee/Hip Exercises: [employer] operational officer Limitations 3 X 30  right     Manual Therapy   Manual Therapy Taping   McConnell moving patella medial.                  PT Short Term Goals - 09/21/17 1252      PT SHORT TERM GOAL #1   Title pt to be I with inital HEP    Time 3   Period Weeks   Status New   Target Date 10/12/17     PT SHORT TERM GOAL #2   Title pt to verbalize techniques to reduce R knee pain and inflammation via RICE and HEP    Time 3   Period Weeks   Status New   Target Date 10/19/17           PT Long Term Goals - 09/21/17 1253  PT LONG TERM GOAL #1   Title pt to increase R knee AROM to >/= -3 to 120 degrees on the RLE for functional and efficent gait pattern with </= 2/10 pain   Time 6   Period Weeks   Status New   Target Date 11/02/17     PT LONG TERM GOAL #2   Title pt to increase strength in bil LE to >/= 4/5 to provide knee / hip stability with prolonged walking/ standing activities    Time 6   Period Weeks   Status New   Target Date 11/02/17     PT LONG TERM GOAL #3   Title pt to increase sitting/ standing and walking time to >/= 20 min with </= 2/10 pain for functional endurance required for ADLs    Time 6   Period Weeks   Status New   Target Date 11/02/17     PT LONG TERM GOAL #4   Title pt to be I with all HEP given as of last visit    Time 6   Period Weeks   Status New               Plan - 09/29/17 1217    Clinical Impression Statement Short session due to patient late.  No new goals met.  Patient moves slow and guarded in the morning.     PT  Treatment/Interventions ADLs/Self Care Home Management;Cryotherapy;Electrical Stimulation;Iontophoresis 69m/ml Dexamethasone;Moist Heat;Ultrasound;Therapeutic activities;Therapeutic exercise;Dry needling;Taping;Manual techniques;Patient/family education;Balance training;Neuromuscular re-education   PT Next Visit Plan review and update HEP PRN, assess hip/ knee strength, manual techniques for vastus lateralis, VMO activation, hip strengthening, McConnel taping,  assess treatment   PT Home Exercise Plan VMO SAQ, hip flexor stretching, supine clams   Consulted and Agree with Plan of Care Patient      Patient will benefit from skilled therapeutic intervention in order to improve the following deficits and impairments:     Visit Diagnosis: Chronic pain of right knee  Stiffness of right knee, not elsewhere classified  Muscle weakness (generalized)  Other abnormalities of gait and mobility     Problem List Patient Active Problem List   Diagnosis Date Noted  . Right leg pain 08/16/2017  . Tenosynovitis of finger 05/26/2017  . Trigger thumb, left thumb 05/18/2017  . Arthritis of carpometacarpal ([facility] joint of right thumb 03/10/2017  . Essential hypertension 03/04/2017  . Healthcare maintenance 03/04/2017  . Depression 03/09/2016  . Carpal tunnel syndrome 03/09/2016  . Diabetes [place], [place] 2 [place] 08/15/2014  . Poor dental hygiene 08/15/2014  . Sciatica 05/31/2012  . OBESITY, UNSPECIFIED 03/07/2010  . TOBACCO ABUSE 03/07/2010  . PAIN IN JOINT, MULTIPLE SITES 03/07/2010    Jowel Waltner PTA 09/29/2017, 12:20 PM  [facility]GH[address] [place] [address]Phone: [phone]  Fax:  [phone] Name: Melanie WAGGONERMRN: [id number] of Birth: 111-27-1967

## 2017-09-30 ENCOUNTER — Ambulatory Visit
Admission: RE | Admit: 2017-09-30 | Discharge: 2017-09-30 | Disposition: A | Discharge status: Home / Self Care | Payer: No Typology Code available for payment source | Source: Ambulatory Visit | Attending: Obstetrics and Gynecology | Admitting: Obstetrics and Gynecology

## 2017-09-30 ENCOUNTER — Other Ambulatory Visit: Payer: Self-pay | Admitting: Obstetrics and Gynecology

## 2017-09-30 ENCOUNTER — Other Ambulatory Visit (HOSPITAL_COMMUNITY): Payer: Self-pay | Admitting: Obstetrics and Gynecology

## 2017-09-30 ENCOUNTER — Encounter (HOSPITAL_COMMUNITY): Payer: Self-pay

## 2017-09-30 ENCOUNTER — Ambulatory Visit (HOSPITAL_COMMUNITY)
Admission: RE | Admit: 2017-09-30 | Discharge: 2017-09-30 | Disposition: A | Discharge status: Home / Self Care | Payer: Self-pay | Source: Ambulatory Visit | Attending: Obstetrics and Gynecology | Admitting: Obstetrics and Gynecology

## 2017-09-30 VITALS — BP 136/98 | Temp 98.4°F | Ht 69.5 in | Wt 250.8 lb

## 2017-09-30 DIAGNOSIS — N6320 Unspecified lump in the left breast, unspecified quadrant: Secondary | ICD-10-CM

## 2017-09-30 DIAGNOSIS — Z1239 Encounter for other screening for malignant neoplasm of breast: Secondary | ICD-10-CM

## 2017-09-30 DIAGNOSIS — N63 Unspecified lump in unspecified breast: Secondary | ICD-10-CM

## 2017-09-30 DIAGNOSIS — N644 Mastodynia: Secondary | ICD-10-CM

## 2017-09-30 NOTE — Patient Instructions (Signed)
Explained breast self awareness with Darlis Loan. Patient did not need a Pap smear today due to last Pap smear and HPV typing was 03/03/2017. Let her know [facility]CP will cover Pap smears and HPV typing every 5 years unless has a history of abnormal Pap smears. Referred patient to the [facility] for diagnostic mammogram. Appointment scheduled for Thursday, September 30, 2017 at 0920. Discussed smoking cessation with patient. Referred to the [facility] Quitline and gave resources to the free smoking cessation classes at [facility]. Gilby verbalized understanding.  Shalinda Burkholder, [place], RN 8:53 AM

## 2017-09-30 NOTE — Progress Notes (Signed)
Complaints of right outer breast pain that comes and goes. Patient rates the pain at a 6-7 out of 10.  Pap Smear: Pap smear not completed today. Last Pap smear was 03/03/2017 at [facility], [place] and normal with negative HPV. Per patient has no history of an abnormal Pap smear. Last two Pap smear results are in EPIC.  Physical exam: Breasts Breasts symmetrical. No skin abnormalities bilateral breasts. No nipple retraction bilateral breasts. No nipple discharge bilateral breasts. No lymphadenopathy. No lumps palpated bilateral breasts. Complaints of right outer breast tenderness on exam. Referred patient to the [facility] for diagnostic mammogram. Appointment scheduled for Thursday, September 30, 2017 at 0920.        Pelvic/Bimanual No Pap smear completed today since last Pap smear and HPV typing was 03/03/2017. Pap smear not indicated per BCCCP guidelines.   Smoking History:  Patient is a current smoker. Discussed smoking cessation with patient. Referred to the [facility] Quitline and gave resources to the free smoking cessation classes at [facility].  Patient Navigation: Patient education provided. Access to services provided for patient through [place] program.   Colorectal Cancer Screening: Per patient has never had a colonoscopy completed. No complaints today. FIT Test given to patient to complete and return to B[facility]CP.

## 2017-10-01 ENCOUNTER — Ambulatory Visit: Payer: Self-pay | Admitting: Physical Therapy

## 2017-10-01 ENCOUNTER — Encounter (HOSPITAL_COMMUNITY): Payer: Self-pay

## 2017-10-06 ENCOUNTER — Ambulatory Visit: Payer: Self-pay | Attending: Family Medicine | Admitting: Physical Therapy

## 2017-10-06 ENCOUNTER — Telehealth: Payer: Self-pay | Admitting: Physical Therapy

## 2017-10-06 DIAGNOSIS — M6281 Muscle weakness (generalized): Secondary | ICD-10-CM | POA: Insufficient documentation

## 2017-10-06 DIAGNOSIS — G8929 Other chronic pain: Secondary | ICD-10-CM | POA: Insufficient documentation

## 2017-10-06 DIAGNOSIS — R2689 Other abnormalities of gait and mobility: Secondary | ICD-10-CM | POA: Insufficient documentation

## 2017-10-06 DIAGNOSIS — M25561 Pain in right knee: Secondary | ICD-10-CM | POA: Insufficient documentation

## 2017-10-06 DIAGNOSIS — M25661 Stiffness of right knee, not elsewhere classified: Secondary | ICD-10-CM | POA: Insufficient documentation

## 2017-10-06 NOTE — Telephone Encounter (Signed)
Left message regarding no show. Asked her to call to make an appointment.

## 2017-10-07 ENCOUNTER — Encounter (HOSPITAL_COMMUNITY): Payer: Self-pay

## 2017-10-07 LAB — FECAL OCCULT BLOOD, IMMUNOCHEMICAL: Fecal Occult Bld: NEGATIVE

## 2017-10-14 ENCOUNTER — Encounter: Payer: Self-pay | Admitting: Family Medicine

## 2017-10-19 ENCOUNTER — Ambulatory Visit: Payer: Self-pay | Admitting: Physical Therapy

## 2017-10-19 ENCOUNTER — Encounter: Payer: Self-pay | Admitting: Physical Therapy

## 2017-10-19 DIAGNOSIS — R2689 Other abnormalities of gait and mobility: Secondary | ICD-10-CM

## 2017-10-19 DIAGNOSIS — M25661 Stiffness of right knee, not elsewhere classified: Secondary | ICD-10-CM

## 2017-10-19 DIAGNOSIS — M6281 Muscle weakness (generalized): Secondary | ICD-10-CM

## 2017-10-19 DIAGNOSIS — M25561 Pain in right knee: Principal | ICD-10-CM

## 2017-10-19 DIAGNOSIS — G8929 Other chronic pain: Secondary | ICD-10-CM

## 2017-10-19 NOTE — Therapy (Signed)
[place], [place], [address] Phone: [phone]   Fax:  [phone]  Physical Therapy Treatment / Re-certification   Patient Details  Name: Melanie Phillips MRN: [id number] Date of Birth: October 06, 1966 Referring Provider: Kinnie Feil, MD   Encounter Date: 10/19/2017  PT End of Session - 10/19/17 0842    Visit Number  3    Number of Visits  13    Date for PT Re-Evaluation  11/09/17    PT Start Time  0803    PT Stop Time  0853    PT Time Calculation (min)  50 min    Activity Tolerance  Patient tolerated treatment well;Patient limited by pain    Behavior During Therapy  [facility] for tasks assessed/performed       Past Medical History:  Diagnosis Date  . Anxiety   . Arthritis   . Back pain   . Depression   . Diabetes mellitus without complication ([place])   . GERD (gastroesophageal reflux disease)   . Hyperlipidemia   . Hypertension     Past Surgical History:  Procedure Laterality Date  . CARPAL TUNNEL RELEASE Right 03/10/2017   Performed by Leandrew Koyanagi, MD at [facility]  . RELEASE TRIGGER FINGER LEFT THUMB Left 05/26/2017   Performed by Leandrew Koyanagi, MD at [facility]  . Right thumb Ligament Reconstruction Tendon Interposition, Right Carpal tunnel release Right 03/10/2017   Performed by Leandrew Koyanagi, MD at [facility]  . TUBAL LIGATION      There were no vitals filed for this visit.  Subjective Assessment - 10/19/17 0804    Subjective  "the pain is about the same since, today is a 9/10. I am having pain now in the R heel that is new starting over the last few weeks"     Currently in Pain?  Yes    Pain Score  9     Pain Location  Knee    Pain Orientation  Right    Pain Descriptors / Indicators  Constant    Pain Type  Chronic pain    Pain Radiating Towards  down to the ankle    Pain Onset  More than a month ago    Pain Frequency  Constant    Aggravating Factors   getting out of bed in the morning, sitting for long period time    Pain Relieving Factors  heat,         [facility] PT Assessment - 10/19/17 0810      AROM   Right Knee Extension  -6    Right Knee Flexion  112      Strength   Right Knee Flexion  4-/5 pain during testing    Right Knee Extension  4-/5 pain during testing                   [facility] Adult PT Treatment/Exercise - 10/19/17 0825      Knee/Hip Exercises: Supine   Short Arc Quad Sets  Strengthening;Right;2 sets 12 x 2# with ball squeeze    Other Supine Knee/Hip Exercises  clams 2 x 12 with green theraband      Modalities   Modalities  Cryotherapy      Cryotherapy   Number Minutes Cryotherapy  10 Minutes    Cryotherapy Location  Knee    Type of Cryotherapy  Ice pack      Manual Therapy  Manual therapy comments  MTPR over R vastus lateralis x 3    McConnell  lateral> medial on R knee             PT Education - 10/19/17 0842    Education provided  Yes    Education Details  reviewed and updated hep. discussed that we should see progress within the next set of visits. and if no progress is made and pain continues to stay at an elevated level she may benefit from being referred back to MD for further assessment. discussed changes in gait which could result in additional soreness in the RLE and heel.     Person(s) Educated  Patient    Methods  Explanation;Verbal cues;Handout    Comprehension  Verbalized understanding;Verbal cues required       PT Short Term Goals - 10/19/17 0831      PT SHORT TERM GOAL #1   Title  pt to be I with inital HEP     Time  3    Period  Weeks    Status  Achieved      PT SHORT TERM GOAL #2   Title  pt to verbalize techniques to reduce R knee pain and inflammation via RICE and HEP     Time  3    Period  Weeks    Status  On-going        PT Long Term Goals - 10/19/17 6948      PT LONG TERM GOAL #1   Title  pt to increase R knee AROM to >/= -3 to  120 degrees on the RLE for functional and efficent gait pattern with </= 2/10 pain    Time  6    Period  Weeks    Status  On-going      PT LONG TERM GOAL #2   Title  pt to increase strength in bil LE to >/= 4/5 to provide knee / hip stability with prolonged walking/ standing activities     Time  6    Period  Weeks    Status  On-going      PT LONG TERM GOAL #3   Title  pt to increase sitting/ standing and walking time to >/= 20 min with </= 2/10 pain for functional endurance required for ADLs     Time  6    Period  Weeks    Status  On-going      PT LONG TERM GOAL #4   Title  pt to be I with all HEP given as of last visit     Period  Weeks    Status  On-going            Plan - 10/19/17 0955    Clinical Impression Statement  pt reports consistency with her HEP but states she still as 9/10 pain, and has not been seen since 10/31. she required verbal cues to review HEP for proper form. continued McConnel taping to promote proper patellar position. following VMO activation techniques and hip abductor strenthening she reported soreness inthe calf/ ankle and some relief of pain inthe knee. she has met STG #1 and is progressing with STG#2. She would benefit from continued physical therapy to decrease r knee pain, improve function and work toward remaining goals.     PT Frequency  2x / week    PT Duration  3 weeks    PT Treatment/Interventions  ADLs/Self Care Home Management;Cryotherapy;Electrical Stimulation;Iontophoresis 57m/ml Dexamethasone;Moist Heat;Ultrasound;Therapeutic activities;Therapeutic exercise;Dry needling;Taping;Manual techniques;Patient/family  education;Balance training;Neuromuscular re-education    PT Next Visit Plan  review and update HEP PRN, assess hip/ knee strength, manual techniques for vastus lateralis, VMO activation, hip strengthening, McConnel taping,     PT Home Exercise Plan  VMO SAQ, hip flexor stretching, supine clams    Consulted and Agree with Plan of Care   Patient       Patient will benefit from skilled therapeutic intervention in order to improve the following deficits and impairments:  Abnormal gait, Pain, Improper body mechanics, Postural dysfunction, Decreased endurance, Decreased range of motion, Decreased strength, Decreased mobility, Decreased activity tolerance, Decreased balance  Visit Diagnosis: Chronic pain of right knee  Stiffness of right knee, not elsewhere classified  Muscle weakness (generalized)  Other abnormalities of gait and mobility     Problem List Patient Active Problem List   Diagnosis Date Noted  . Right leg pain 08/16/2017  . Tenosynovitis of finger 05/26/2017  . Trigger thumb, left thumb 05/18/2017  . Arthritis of carpometacarpal [facility]) joint of right thumb 03/10/2017  . Essential hypertension 03/04/2017  . Healthcare maintenance 03/04/2017  . Depression 03/09/2016  . Carpal tunnel syndrome 03/09/2016  . Diabetes mellitus, type 2 ([place]) 08/15/2014  . Poor dental hygiene 08/15/2014  . Sciatica 05/31/2012  . OBESITY, UNSPECIFIED 03/07/2010  . TOBACCO ABUSE 03/07/2010  . PAIN IN JOINT, MULTIPLE SITES 03/07/2010   Starr Lake PT, DPT, LAT, ATC  10/19/17  10:04 AM      [facility] [address] [place], [place], [address] Phone: [phone]   Fax:  [phone]  Name: Melanie Phillips MRN: [id number] Date of Birth: 01-01-1966

## 2017-10-28 ENCOUNTER — Telehealth: Payer: Self-pay | Admitting: Physical Therapy

## 2017-10-28 ENCOUNTER — Ambulatory Visit: Payer: Self-pay | Admitting: Physical Therapy

## 2017-10-28 NOTE — Telephone Encounter (Signed)
Spoke with patient regarding no-show to appointment this morning. She was not feeling well today but plans to attend her future appointments. I reminded her of our no-show policy and asked her to call us if she cannot attend any future appointments. She verbalized understanding.

## 2017-11-03 ENCOUNTER — Encounter: Payer: Self-pay | Admitting: Physical Therapy

## 2017-11-03 ENCOUNTER — Ambulatory Visit: Payer: Self-pay | Attending: Family Medicine | Admitting: Physical Therapy

## 2017-11-03 DIAGNOSIS — G8929 Other chronic pain: Secondary | ICD-10-CM | POA: Insufficient documentation

## 2017-11-03 DIAGNOSIS — M25661 Stiffness of right knee, not elsewhere classified: Secondary | ICD-10-CM | POA: Insufficient documentation

## 2017-11-03 DIAGNOSIS — M25561 Pain in right knee: Secondary | ICD-10-CM | POA: Insufficient documentation

## 2017-11-03 DIAGNOSIS — R2689 Other abnormalities of gait and mobility: Secondary | ICD-10-CM | POA: Insufficient documentation

## 2017-11-03 DIAGNOSIS — M6281 Muscle weakness (generalized): Secondary | ICD-10-CM | POA: Insufficient documentation

## 2017-11-03 NOTE — Therapy (Signed)
[place], [place], [address] Phone: [phone]   Fax:  [phone]  Physical Therapy Treatment  Patient Details  Name: Melanie Phillips MRN: [id number] Date of Birth: 01-28-1966 Referring Provider: Kinnie Feil, MD   Encounter Date: 11/03/2017  PT End of Session - 11/03/17 0943    Visit Number  4    Number of Visits  13    Date for PT Re-Evaluation  11/09/17    Authorization Type  CAFA    PT Start Time  0937    PT Stop Time  1025    PT Time Calculation (min)  48 min       Past Medical History:  Diagnosis Date  . Anxiety   . Arthritis   . Back pain   . Depression   . Diabetes mellitus without complication ([place])   . GERD (gastroesophageal reflux disease)   . Hyperlipidemia   . Hypertension     Past Surgical History:  Procedure Laterality Date  . CARPAL TUNNEL RELEASE Right 03/10/2017   Procedure: CARPAL TUNNEL RELEASE;  Surgeon: Leandrew Koyanagi, MD;  Location: [place];  Service: Orthopedics;  Laterality: Right;  . CARPOMETACARPEL SUSPENSION PLASTY Right 03/10/2017   Procedure: Right thumb Ligament Reconstruction Tendon Interposition, Right Carpal tunnel release;  Surgeon: Leandrew Koyanagi, MD;  Location: [facility];  Service: Orthopedics;  Laterality: Right;  . TRIGGER FINGER RELEASE Left 05/26/2017   Procedure: RELEASE TRIGGER FINGER LEFT THUMB;  Surgeon: Leandrew Koyanagi, MD;  Location: [place];  Service: Orthopedics;  Laterality: Left;  . TUBAL LIGATION      There were no vitals filed for this visit.  Subjective Assessment - 11/03/17 0940    Currently in Pain?  Yes    Pain Score  10-Worst pain ever    Pain Location  Knee    Pain Orientation  Right;Medial;Lateral                      [facility] Adult PT Treatment/Exercise - 11/03/17 0001      Knee/Hip Exercises: Stretches   Active Hamstring Stretch Limitations  3 X 30  right    Other Knee/Hip Stretches  piriformis stretch IR and ER 10 sec x 5 each       Knee/Hip Exercises: Supine   Quad Sets  15 reps    Bridges  5 reps pain in right lateral Knee    Straight Leg Raises  10 reps    Other Supine Knee/Hip Exercises  clams 2 x 12 with green theraband      Modalities   Modalities  Moist Heat      Moist Heat Therapy   Number Minutes Moist Heat  10 Minutes    Moist Heat Location  Knee      Manual Therapy   McConnell  lateral> medial on R knee             PT Education - 11/03/17 1018    Education provided  Yes    Education Details  HEP    Person(s) Educated  Patient    Methods  Explanation;Handout    Comprehension  Verbalized understanding       PT Short Term Goals - 10/19/17 0831      PT SHORT TERM GOAL #1   Title  pt to be I with inital HEP     Time  3    Period  Weeks  Status  Achieved      PT SHORT TERM GOAL #2   Title  pt to verbalize techniques to reduce R knee pain and inflammation via RICE and HEP     Time  3    Period  Weeks    Status  On-going        PT Long Term Goals - 10/19/17 5732      PT LONG TERM GOAL #1   Title  pt to increase R knee AROM to >/= -3 to 120 degrees on the RLE for functional and efficent gait pattern with </= 2/10 pain    Time  6    Period  Weeks    Status  On-going      PT LONG TERM GOAL #2   Title  pt to increase strength in bil LE to >/= 4/5 to provide knee / hip stability with prolonged walking/ standing activities     Time  6    Period  Weeks    Status  On-going      PT LONG TERM GOAL #3   Title  pt to increase sitting/ standing and walking time to >/= 20 min with </= 2/10 pain for functional endurance required for ADLs     Time  6    Period  Weeks    Status  On-going      PT LONG TERM GOAL #4   Title  pt to be I with all HEP given as of last visit     Period  Weeks    Status  On-going            Plan - 11/03/17 1001    Clinical Impression Statement  Pt reports consostency with  HEP however has difficulty making it to appointments due to other health issues and pain. She c/o Right buttock/sciatic pain which referrs to her right calf. She reports increased pain with calf stretch. Added piriformis and hamstring stretches to decrease gluteal pain. Increased knee pain with bridges .  Reapplied tape and gave pt names of name to purchase if she is interested in self application.     PT Next Visit Plan  review and update HEP PRN, assess hip/ knee strength, manual techniques for vastus lateralis, VMO activation, hip strengthening, McConnel taping, review prirformis and hamstring stretch    PT Home Exercise Plan  VMO SAQ, hip flexor stretching, supine clams, DF stretch, SL hip abduction, piriformis stretch, hamstring stretch.     Consulted and Agree with Plan of Care  Patient       Patient will benefit from skilled therapeutic intervention in order to improve the following deficits and impairments:  Abnormal gait, Pain, Improper body mechanics, Postural dysfunction, Decreased endurance, Decreased range of motion, Decreased strength, Decreased mobility, Decreased activity tolerance, Decreased balance  Visit Diagnosis: Chronic pain of right knee  Stiffness of right knee, not elsewhere classified  Muscle weakness (generalized)     Problem List Patient Active Problem List   Diagnosis Date Noted  . Right leg pain 08/16/2017  . Tenosynovitis of finger 05/26/2017  . Trigger thumb, left thumb 05/18/2017  . Arthritis of carpometacarpal [facility]) joint of right thumb 03/10/2017  . Essential hypertension 03/04/2017  . Healthcare maintenance 03/04/2017  . Depression 03/09/2016  . Carpal tunnel syndrome 03/09/2016  . Diabetes mellitus, type 2 ([place]) 08/15/2014  . Poor dental hygiene 08/15/2014  . Sciatica 05/31/2012  . OBESITY, UNSPECIFIED 03/07/2010  . TOBACCO ABUSE 03/07/2010  . PAIN IN JOINT, MULTIPLE SITES  03/07/2010    Dorene Ar, PTA 11/03/2017, 11:17 AM  [place], [place], [address] Phone: [phone]   Fax:  [phone]  Name: Melanie Phillips MRN: [id number] Date of Birth: [birth year]

## 2017-11-05 ENCOUNTER — Ambulatory Visit: Payer: Self-pay | Admitting: Physical Therapy

## 2017-11-10 ENCOUNTER — Ambulatory Visit: Payer: Self-pay | Admitting: Physical Therapy

## 2017-11-12 ENCOUNTER — Ambulatory Visit: Payer: Self-pay | Admitting: Physical Therapy

## 2017-11-17 ENCOUNTER — Ambulatory Visit: Payer: Self-pay | Admitting: Physical Therapy

## 2017-11-17 ENCOUNTER — Encounter: Payer: Self-pay | Admitting: Physical Therapy

## 2017-11-17 DIAGNOSIS — G8929 Other chronic pain: Secondary | ICD-10-CM

## 2017-11-17 DIAGNOSIS — M25561 Pain in right knee: Principal | ICD-10-CM

## 2017-11-17 DIAGNOSIS — R2689 Other abnormalities of gait and mobility: Secondary | ICD-10-CM

## 2017-11-17 DIAGNOSIS — M25661 Stiffness of right knee, not elsewhere classified: Secondary | ICD-10-CM

## 2017-11-17 DIAGNOSIS — M6281 Muscle weakness (generalized): Secondary | ICD-10-CM

## 2017-11-17 NOTE — Therapy (Addendum)
[place], [place], [address] Phone: [phone]   Fax:  [phone]  Physical Therapy Treatment / Re-certification / Discharge Summary  Patient Details  Name: Melanie Phillips MRN: [id number] Date of Birth: 02/14/1966 Referring Provider: Kinnie Feil, MD   Encounter Date: 11/17/2017  PT End of Session - 11/17/17 1238    Visit Number  5    Number of Visits  13    Date for PT Re-Evaluation  12/15/17    Authorization Type  CAFA    PT Start Time  1147    PT Stop Time  1235    PT Time Calculation (min)  48 min    Activity Tolerance  Patient tolerated treatment well    Behavior During Therapy  [facility] for tasks assessed/performed       Past Medical History:  Diagnosis Date  . Anxiety   . Arthritis   . Back pain   . Depression   . Diabetes mellitus without complication ([place])   . GERD (gastroesophageal reflux disease)   . Hyperlipidemia   . Hypertension     Past Surgical History:  Procedure Laterality Date  . CARPAL TUNNEL RELEASE Right 03/10/2017   Procedure: CARPAL TUNNEL RELEASE;  Surgeon: Leandrew Koyanagi, MD;  Location: [facility];  Service: Orthopedics;  Laterality: Right;  . CARPOMETACARPEL SUSPENSION PLASTY Right 03/10/2017   Procedure: Right thumb Ligament Reconstruction Tendon Interposition, Right Carpal tunnel release;  Surgeon: Leandrew Koyanagi, MD;  Location: [facility];  Service: Orthopedics;  Laterality: Right;  . TRIGGER FINGER RELEASE Left 05/26/2017   Procedure: RELEASE TRIGGER FINGER LEFT THUMB;  Surgeon: Leandrew Koyanagi, MD;  Location: [facility];  Service: Orthopedics;  Laterality: Left;  . TUBAL LIGATION      There were no vitals filed for this visit.  Subjective Assessment - 11/17/17 1157    Subjective  " I am having more pain inthe Foot today and the knee isn't too bad. I fell in the snow the other day"     Currently in Pain?  Yes    Pain  Score  7     Pain Location  Knee    Aggravating Factors   getting out of bed in the morning    Pain Relieving Factors  heat         [facility] PT Assessment - 11/17/17 1238      AROM   Right Knee Extension  -5    Right Knee Flexion  115      Strength   Right Knee Flexion  4-/5    Right Knee Extension  4-/5                  [facility] Adult PT Treatment/Exercise - 11/17/17 1208      Knee/Hip Exercises: Aerobic   Stationary Bike  L1 x 5 min      Knee/Hip Exercises: Seated   Sit to Sand  2 sets;5 reps;with UE support pushing from knees, and verbal cues for nose over toes      Knee/Hip Exercises: Supine   Short Arc Quad Sets  2 sets;10 reps;Strengthening 3# with ball squeeze    Bridges with Ball Squeeze  Both;1 set;10 reps      Knee/Hip Exercises: Sidelying   Hip ABduction  2 sets;10 reps    Hip ABduction Limitations  --      Manual Therapy   Manual Therapy  Soft tissue mobilization;Myofascial  release    Manual therapy comments  rolling foot over therabar for DTM over the PF, MTPR over vastus lateralus x 3     Myofascial Release  DTM using roller over vastus lateralus on R    McConnell  lateral> medial on R knee             PT Education - 11/17/17 1243    Education provided  Yes    Education Details  sit to stand biomechanics using hands on knees and nose over toes technique. reviewed previously provided HEP and beneifts of continued exercise.     Person(s) Educated  Patient    Methods  Explanation;Demonstration;Verbal cues;Handout    Comprehension  Verbalized understanding;Verbal cues required;Returned demonstration       PT Short Term Goals - 11/17/17 1244      PT SHORT TERM GOAL #1   Title  pt to be I with inital HEP     Time  3    Period  Weeks    Status  Achieved      PT SHORT TERM GOAL #2   Title  pt to verbalize techniques to reduce R knee pain and inflammation via RICE and HEP     Time  3    Period  Weeks    Status  Partially Met         PT Long Term Goals - 11/17/17 1244      PT LONG TERM GOAL #1   Title  pt to increase R knee AROM to >/= -3 to 120 degrees on the RLE for functional and efficent gait pattern with </= 2/10 pain    Time  6    Period  Weeks    Status  On-going    Target Date  12/08/17      PT LONG TERM GOAL #2   Title  pt to increase strength in bil LE to >/= 4/5 to provide knee / hip stability with prolonged walking/ standing activities     Time  6    Period  Weeks    Status  On-going    Target Date  12/08/17      PT LONG TERM GOAL #3   Title  pt to increase sitting/ standing and walking time to >/= 20 min with </= 2/10 pain for functional endurance required for ADLs     Time  6    Period  Weeks    Status  On-going    Target Date  12/08/17      PT LONG TERM GOAL #4   Title  pt to be I with all HEP given as of last visit     Time  6    Period  Weeks    Status  On-going    Target Date  12/08/17            Plan - 11/17/17 1239    Clinical Impression Statement  pt continues to report soreness inthe R knee and foot reported at 7/10. reviewed previously provided HEP and benefits of exercises. continued taping for patellar alignment and strengtehing of the hp/ knee. She demonstrates new finding of potential Plantar fasciitis which she plans to discuss with her MD. She required multiple verbal cues for form and assurance that she can do the exercise with sit to stand strengthening.  Following today session she reported decreased pain inthe foot and knee. She is progressing with goals and would benefit from continued physical therapy to reduce hip/ knee pain  and weakness and maximze her function by addressing the deficits listed.     Rehab Potential  Good    PT Frequency  2x / week    PT Duration  3 weeks    PT Treatment/Interventions  ADLs/Self Care Home Management;Cryotherapy;Electrical Stimulation;Iontophoresis 41m/ml Dexamethasone;Moist Heat;Ultrasound;Therapeutic activities;Therapeutic  exercise;Dry needling;Taping;Manual techniques;Patient/family education;Balance training;Neuromuscular re-education    PT Next Visit Plan  update HEP PRN, assess hip/ knee strength, manual techniques for vastus lateralis, VMO activation, hip strengthening, McConnel taping, review prirformis and hamstring stretch    PT Home Exercise Plan  VMO SAQ, hip flexor stretching, supine clams, DF stretch, SL hip abduction, piriformis stretch, hamstring stretch, sit to stand     Consulted and Agree with Plan of Care  Patient       Patient will benefit from skilled therapeutic intervention in order to improve the following deficits and impairments:  Abnormal gait, Pain, Improper body mechanics, Postural dysfunction, Decreased endurance, Decreased range of motion, Decreased strength, Decreased mobility, Decreased activity tolerance, Decreased balance  Visit Diagnosis: Chronic pain of right knee  Stiffness of right knee, not elsewhere classified  Muscle weakness (generalized)  Other abnormalities of gait and mobility     Problem List Patient Active Problem List   Diagnosis Date Noted  . Right leg pain 08/16/2017  . Tenosynovitis of finger 05/26/2017  . Trigger thumb, left thumb 05/18/2017  . Arthritis of carpometacarpal ([facility] joint of right thumb 03/10/2017  . Essential hypertension 03/04/2017  . Healthcare maintenance 03/04/2017  . Depression 03/09/2016  . Carpal tunnel syndrome 03/09/2016  . Diabetes mellitus, type 2 ([place] 08/15/2014  . Poor dental hygiene 08/15/2014  . Sciatica 05/31/2012  . OBESITY, UNSPECIFIED 03/07/2010  . TOBACCO ABUSE 03/07/2010  . PAIN IN JOINT, MULTIPLE SITES 03/07/2010   KStarr LakePT, DPT, LAT, ATC  11/17/17  12:46 PM      [facility]GM[address] [place] [address]Phone: [phone]  Fax:  [phone] Name: FNIAJA STICKLEYMRN: [id number] of Birth:  1March 24, 1967      PHYSICAL THERAPY DISCHARGE SUMMARY  Visits from Start of Care: 5  Current functional level related to goals / functional outcomes: See goals   Remaining deficits: unknown   Education / Equipment: HEP, theraband  Plan: Patient agrees to discharge.  Patient goals were not met. Patient is being discharged due to not returning since the last visit.  ?????         Kedron Uno PT, DPT, LAT, ATC  12/14/17  1:23 PM

## 2017-11-26 ENCOUNTER — Telehealth: Payer: Self-pay | Admitting: Physical Therapy

## 2017-11-26 ENCOUNTER — Ambulatory Visit: Payer: Self-pay | Admitting: Physical Therapy

## 2017-11-26 NOTE — Telephone Encounter (Signed)
Left message regarding no-show to appointment today and asked for patient to call if she cannot make her next appointment.

## 2017-12-02 ENCOUNTER — Telehealth: Payer: Self-pay | Admitting: Physical Therapy

## 2017-12-02 ENCOUNTER — Ambulatory Visit: Payer: Self-pay | Attending: Family Medicine | Admitting: Physical Therapy

## 2017-12-02 NOTE — Telephone Encounter (Signed)
Left message regarding patient's second consecutive no-show. Reminded her that per our attendance policy I have to cancel her future appointments, however, she can call to make an appointment if she wants to continue PT.

## 2017-12-03 ENCOUNTER — Ambulatory Visit: Payer: Self-pay | Admitting: Physical Therapy

## 2017-12-03 ENCOUNTER — Encounter: Payer: Self-pay | Admitting: Family Medicine

## 2017-12-03 ENCOUNTER — Other Ambulatory Visit: Payer: Self-pay

## 2017-12-03 ENCOUNTER — Ambulatory Visit (INDEPENDENT_AMBULATORY_CARE_PROVIDER_SITE_OTHER): Payer: Self-pay | Admitting: Family Medicine

## 2017-12-03 VITALS — BP 170/100 | HR 79 | Temp 98.9°F | Wt 249.0 lb

## 2017-12-03 DIAGNOSIS — F32A Depression, unspecified: Secondary | ICD-10-CM

## 2017-12-03 DIAGNOSIS — F329 Major depressive disorder, single episode, unspecified: Secondary | ICD-10-CM

## 2017-12-03 DIAGNOSIS — M2550 Pain in unspecified joint: Secondary | ICD-10-CM

## 2017-12-03 DIAGNOSIS — Z9189 Other specified personal risk factors, not elsewhere classified: Secondary | ICD-10-CM

## 2017-12-03 DIAGNOSIS — M65312 Trigger thumb, left thumb: Secondary | ICD-10-CM

## 2017-12-03 MED ORDER — KETOROLAC TROMETHAMINE 30 MG/ML IJ SOLN
30.0000 mg | Freq: Once | INTRAMUSCULAR | Status: AC
  Administered 2017-12-03: 30 mg via INTRAMUSCULAR

## 2017-12-03 MED ORDER — LISINOPRIL 5 MG PO TABS: 5.0000 mg | ORAL_TABLET | Freq: Every day | ORAL | 3 refills | Status: DC

## 2017-12-03 MED ORDER — AMITRIPTYLINE HCL 25 MG PO TABS: 25.0000 mg | ORAL_TABLET | Freq: Every day | ORAL | 2 refills | Status: DC

## 2017-12-03 NOTE — Assessment & Plan Note (Signed)
  Switching from zoloft to amitriptyline as above. Follow up 1 month. May need to titrate up TCA. Advised to NOT TAKE ssri and TCA together due to risk for serotonin syndrome, patient verbalized understanding of serious interaction between the 2 medications

## 2017-12-03 NOTE — Assessment & Plan Note (Signed)
  Patient requesting percocet for pain. Discussed with her poor efficacy for opiates in long term. Advised tylenol/ibuprofen in combination scheduled for pain. Started amitriptyline 25 mg every night. Advised patient to cease taking zoloft, advised of interaction between ssri and TCA (serotonin syndrome) Patient states her understanding. Follow up 1 month to assess.

## 2017-12-03 NOTE — Assessment & Plan Note (Signed)
  Patient currently with dental pain due to dental caries/exposed nerves. Discussed with financial planner at clinic who will check on status of orange card and financial assistance program. Encouraged patient to follow up with dentist who had planned for extraction of teeth.

## 2017-12-03 NOTE — Progress Notes (Signed)
    Subjective:    Patient ID: Darlis Loan, female    DOB: 01-Jul-1966, 52 y.o.   MRN: [id number]  CC: requesting pain medication  Diffuse pain: Patient complaining of: Sciatic nerve pain, dental pain, trigger finger pain, tennis elbow, bilateral knee pain. She is unable to see ortho due to poor finances. She is unable to partake in PT due to pain. She cannot tolerate steroid injections due to a reaction from them in the past. She states mobic does not work, tramadol does not work. She is asking for percocet which she has had in past.   She is on zoloft for depression but it is expensive (26 dollars a month) and she cannot afford this, takes it sparingly (once a week).   Dentist- was going to dentist that accepted orange card but financial assistance was not renewed in time to get to appointment for tooth extraction. She has exposed root on upper tooth which is causing severe pain. Has orange card now. Has not rescheduled w/ dentist because she does not have the 30 dollar copay.   Smoking status reviewed- every day smoker  Review of Systems- no fevers, chills, chest pain, SOB.    Objective:  BP (!) 170/100   Pulse 79   Temp 98.9 F (37.2 C) (Oral)   Wt 249 lb (112.9 kg)   LMP 06/23/2013   SpO2 98%   BMI 36.24 kg/m  Vitals and nursing note reviewed  General: well nourished, in no acute distress Cardiac: RRR, clear S1 and S2, no murmurs, rubs, or gallops Respiratory: clear to auscultation bilaterally, no increased work of breathing Extremities: no edema or cyanosis. Skin: warm and dry, no rashes noted Neuro: alert and oriented, no focal deficits, normal gait  Assessment & Plan:    PAIN IN JOINT, MULTIPLE SITES  Patient requesting percocet for pain. Discussed with her poor efficacy for opiates in long term. Advised tylenol/ibuprofen in combination scheduled for pain. Started amitriptyline 25 mg every night. Advised patient to cease taking zoloft, advised of interaction  between ssri and TCA (serotonin syndrome) Patient states her understanding. Follow up 1 month to assess.  Depression  Switching from zoloft to amitriptyline as above. Follow up 1 month. May need to titrate up TCA. Advised to NOT TAKE ssri and TCA together due to risk for serotonin syndrome, patient verbalized understanding of serious interaction between the 2 medications  Poor dental hygiene  Patient currently with dental pain due to dental caries/exposed nerves. Discussed with financial planner at clinic who will check on status of orange card and financial assistance program. Encouraged patient to follow up with dentist who had planned for extraction of teeth.    Return in about 4 weeks (around 12/31/2017).   Lucila Maine, DO Family Medicine Resident PGY-2

## 2017-12-03 NOTE — Patient Instructions (Addendum)
I'm sorry you're not feeling well. Please take tylenol and ibuprofen together, studies have shown these 2 medications together are more effective at treating pain than opiate pain medication.  STOP taking zoloft. START taking amitriptyline every night.  Weight loss and physical activity can help with your chronic pain.   If you have questions or concerns please do not hesitate to call at [phone].  Lucila Maine, DO PGY-2, [facility] 12/03/2017 11:58 AM   Chronic Pain, Adult Chronic pain is a type of pain that lasts or keeps coming back (recurs) for at least six months. You may have chronic headaches, abdominal pain, or body pain. Chronic pain may be related to an illness, such as fibromyalgia or complex regional pain syndrome. Sometimes the cause of chronic pain is not known. Chronic pain can make it hard for you to do daily activities. If not treated, chronic pain can lead to other health problems, including anxiety and depression. Treatment depends on the cause and severity of your pain. You may need to work with a pain specialist to come up with a treatment plan. The plan may include medicine, counseling, and physical therapy. Many people benefit from a combination of two or more types of treatment to control their pain. Follow these instructions at home: Lifestyle  Consider keeping a pain diary to share with your health care providers.  Consider talking with a mental health care provider (psychologist) about how to cope with chronic pain.  Consider joining a chronic pain support group.  Try to control or lower your stress levels. Talk to your health care provider about strategies to do this. General instructions   Take over-the-counter and prescription medicines only as told by your health care provider.  Follow your treatment plan as told by your health care provider. This may include: ? Gentle, regular exercise. ? Eating a healthy diet that includes foods  such as vegetables, fruits, fish, and lean meats. ? Cognitive or behavioral therapy. ? Working with a Community education officer. ? Meditation or yoga. ? Acupuncture or massage therapy. ? Aroma, color, light, or sound therapy. ? Local electrical stimulation. ? Shots (injections) of numbing or pain-relieving medicines into the spine or the area of pain.  Check your pain level as told by your health care provider. Ask your health care provider if you should use a pain scale.  Learn as much as you can about how to manage your chronic pain. Ask your health care provider if an intensive pain rehabilitation program or a chronic pain specialist would be helpful.  Keep all follow-up visits as told by your health care provider. This is important. Contact a health care provider if:  Your pain gets worse.  You have new pain.  You have trouble sleeping.  You have trouble doing your normal activities.  Your pain is not controlled with treatment.  Your have side effects from pain medicine.  You feel weak. Get help right away if:  You lose feeling or have numbness in your body.  You lose control of bowel or bladder function.  Your pain suddenly gets much worse.  You develop shaking or chills.  You develop confusion.  You develop chest pain.  You have trouble breathing or shortness of breath.  You pass out.  You have thoughts about hurting yourself or others. This information is not intended to replace advice given to you by your health care provider. Make sure you discuss any questions you have with your health care provider. Document Released:  08/08/2002 Document Revised: 07/16/2016 Document Reviewed: 05/05/2016 Elsevier Interactive Patient Education  [employer].

## 2017-12-06 ENCOUNTER — Ambulatory Visit: Payer: Self-pay | Admitting: Physical Therapy

## 2017-12-08 ENCOUNTER — Encounter: Payer: No Typology Code available for payment source | Admitting: Physical Therapy

## 2017-12-12 ENCOUNTER — Other Ambulatory Visit: Payer: Self-pay | Admitting: Obstetrics and Gynecology

## 2018-01-21 ENCOUNTER — Other Ambulatory Visit: Payer: Self-pay

## 2018-01-21 ENCOUNTER — Encounter: Payer: Self-pay | Admitting: Family Medicine

## 2018-01-21 ENCOUNTER — Ambulatory Visit (INDEPENDENT_AMBULATORY_CARE_PROVIDER_SITE_OTHER): Payer: Self-pay | Admitting: Family Medicine

## 2018-01-21 VITALS — BP 138/80 | HR 116 | Temp 98.3°F | Wt 251.0 lb

## 2018-01-21 DIAGNOSIS — M2550 Pain in unspecified joint: Secondary | ICD-10-CM

## 2018-01-21 MED ORDER — DULOXETINE HCL 30 MG PO CPEP: 30.0000 mg | ORAL_CAPSULE | Freq: Every day | ORAL | 0 refills | Status: DC

## 2018-01-21 MED ORDER — CYCLOBENZAPRINE HCL 10 MG PO TABS: 10.0000 mg | ORAL_TABLET | Freq: Three times a day (TID) | ORAL | 0 refills | Status: DC | PRN

## 2018-01-21 NOTE — Progress Notes (Signed)
    Subjective:    Patient ID: Melanie Phillips, female    DOB: November 12, 1966, 52 y.o.   MRN: [id number]   CC: pain everywhere  HPI: patient complaining of pain in multiple joints, most noticeably hands, knees, and right foot. She cannot get in with ortho because her financial assistance has not been approved yet. She has arthritis in her knee and hands, carpal tunnel in hands, and plantar fasciitis in her foot. She tried the amytriptyline but it made her drowsy and have nightmares so she stopped it. She is taking tylenol 3 times a day and ibuprofen twice a day. She is using heat on her knee w/ some improvement. No fevers, chills, falls, trauma, joint swelling, rashes, weight changes, excessive fatigue. Endorses depression.  Smoking status reviewed- current every day smoker  Review of Systems- see HPI   Objective:  BP 138/80   Pulse (!) 116   Temp 98.3 F (36.8 C) (Oral)   Wt 251 lb (113.9 kg)   LMP 06/23/2013   SpO2 99%   BMI 36.53 kg/m  Vitals and nursing note reviewed  General: obese woman, well nourished, in no acute distress Cardiac: RRR, clear S1 and S2, no murmurs, rubs, or gallops Respiratory: clear to auscultation bilaterally, no increased work of breathing Extremities: no edema or cyanosis. Warm, well perfused. No joint swelling or deformities appreciated.  Skin: warm and dry, no rashes noted Neuro: alert and oriented, no focal deficits   Assessment & Plan:    PAIN IN JOINT, MULTIPLE SITES  Failed TCA. Will try SNRI. Rx for cymbalta 30 mg sent in to pharmacy. Encouraged combination tylenol, ibuprofen scheduled. Advised patient to get capsaicin cream over the counter to use topically. Reviewed plantar fasciitis stretches.  Patient to follow up with ortho once she gets financial assistance coverage.  Follow up 1 month to assess pain control on SNRI. May need to titrate up dose.  Ultimately this patient needs weight loss to help with her various joint pains and to  discontinue smoking to help with her overall health. Continue to address these topics at future visits.    Return in about 4 weeks (around 02/18/2018).   Lucila Maine, DO Family Medicine Resident PGY-2

## 2018-01-21 NOTE — Patient Instructions (Addendum)
While you're at [facility] please get some CAPSAICIN CREAM  CAPZASIN cream  Please get arch support inserts for your shoes and do the following exercises:  If you have questions or concerns please do not hesitate to call at [phone].  Plantar Fasciitis Rehab Ask your health care provider which exercises are safe for you. Do exercises exactly as told by your health care provider and adjust them as directed. It is normal to feel mild stretching, pulling, tightness, or discomfort as you do these exercises, but you should stop right away if you feel sudden pain or your pain gets worse. Do not begin these exercises until told by your health care provider. Stretching and range of motion exercises These exercises warm up your muscles and joints and improve the movement and flexibility of your foot. These exercises also help to relieve pain. Exercise A: Plantar fascia stretch  1. Sit with your left / right leg crossed over your opposite knee. 2. Hold your heel with one hand with that thumb near your arch. With your other hand, hold your toes and gently pull them back toward the top of your foot. You should feel a stretch on the bottom of your toes or your foot or both. 3. Hold this stretch for__________ seconds. 4. Slowly release your toes and return to the starting position. Repeat __________ times. Complete this exercise __________ times a day. Exercise B: Gastroc, standing  1. Stand with your hands against a wall. 2. Extend your left / right leg behind you, and bend your front knee slightly. 3. Keeping your heels on the floor and keeping your back knee straight, shift your weight toward the wall without arching your back. You should feel a gentle stretch in your left / right calf. 4. Hold this position for __________ seconds. Repeat __________ times. Complete this exercise __________ times a day. Exercise C: Soleus, standing 1. Stand with your hands against a wall. 2. Extend your left /  right leg behind you, and bend your front knee slightly. 3. Keeping your heels on the floor, bend your back knee and slightly shift your weight over the back leg. You should feel a gentle stretch deep in your calf. 4. Hold this position for __________ seconds. Repeat __________ times. Complete this exercise __________ times a day. Exercise D: Gastrocsoleus, standing 1. Stand with the ball of your left / right foot on a step. The ball of your foot is on the walking surface, right under your toes. 2. Keep your other foot firmly on the same step. 3. Hold onto the wall or a railing for balance. 4. Slowly lift your other foot, allowing your body weight to press your heel down over the edge of the step. You should feel a stretch in your left / right calf. 5. Hold this position for __________ seconds. 6. Return both feet to the step. 7. Repeat this exercise with a slight bend in your left / right knee. Repeat __________ times with your left / right knee straight and __________ times with your left / right knee bent. Complete this exercise __________ times a day. Balance exercise This exercise builds your balance and strength control of your arch to help take pressure off your plantar fascia. Exercise E: Single leg stand 1. Without shoes, stand near a railing or in a doorway. You may hold onto the railing or door frame as needed. 2. Stand on your left / right foot. Keep your big toe down on the floor and try to keep  your arch lifted. Do not let your foot roll inward. 3. Hold this position for __________ seconds. 4. If this exercise is too easy, you can try it with your eyes closed or while standing on a pillow. Repeat __________ times. Complete this exercise __________ times a day. This information is not intended to replace advice given to you by your health care provider. Make sure you discuss any questions you have with your health care provider. Document Released: 11/16/2005 Document Revised:  07/21/2016 Document Reviewed: 09/30/2015 Elsevier Interactive Patient Education  2018 [employer].

## 2018-01-21 NOTE — Assessment & Plan Note (Signed)
  Failed TCA. Will try SNRI. Rx for cymbalta 30 mg sent in to pharmacy. Encouraged combination tylenol, ibuprofen scheduled. Advised patient to get capsaicin cream over the counter to use topically. Reviewed plantar fasciitis stretches.  Patient to follow up with ortho once she gets financial assistance coverage.  Follow up 1 month to assess pain control on SNRI. May need to titrate up dose.  Ultimately this patient needs weight loss to help with her various joint pains and to discontinue smoking to help with her overall health. Continue to address these topics at future visits.

## 2018-01-25 ENCOUNTER — Telehealth: Payer: Self-pay | Admitting: *Deleted

## 2018-01-25 NOTE — Telephone Encounter (Signed)
-----   Message from Steve Rattler, DO sent at 01/24/2018  4:29 PM EST ----- Regarding: mailed form  Please let Ms. Snipe know I mailed her form to the disability center using the envelope provided with her form.  Lucila Maine, DO PGY-2, [facility] 01/24/2018 4:30 PM

## 2018-01-25 NOTE — Telephone Encounter (Signed)
Patient aware. Jazmin Hartsell,CMA  

## 2018-02-18 ENCOUNTER — Ambulatory Visit (INDEPENDENT_AMBULATORY_CARE_PROVIDER_SITE_OTHER): Payer: Self-pay

## 2018-02-18 ENCOUNTER — Encounter (INDEPENDENT_AMBULATORY_CARE_PROVIDER_SITE_OTHER): Payer: Self-pay | Admitting: Orthopaedic Surgery

## 2018-02-18 ENCOUNTER — Ambulatory Visit (INDEPENDENT_AMBULATORY_CARE_PROVIDER_SITE_OTHER): Payer: Self-pay | Admitting: Orthopaedic Surgery

## 2018-02-18 DIAGNOSIS — M65311 Trigger thumb, right thumb: Secondary | ICD-10-CM

## 2018-02-18 DIAGNOSIS — M1711 Unilateral primary osteoarthritis, right knee: Secondary | ICD-10-CM

## 2018-02-18 NOTE — Progress Notes (Signed)
Office Visit Note   Patient: Melanie Phillips           Date of Birth: Feb 17, 1966           MRN: [id number] Visit Date: 02/18/2018              Requested by: Steve Rattler, DO [place], [place] [address] PCP: Steve Rattler, DO   Assessment & Plan: Visit Diagnoses:  1. Trigger finger of right thumb   2. Unilateral primary osteoarthritis, right knee     Plan: Impression is right trigger thumb.  Because Brentlee is unable to tolerate cortisone injections, we will go ahead and proceed with a right thumb A1 pulley release.  The patient agrees and would like to proceed as well.  Risk benefits possible locations reviewed.  Rehab care time discussed with all questions were answered.  Patient will call with concerns or questions in the meantime.  Follow-Up Instructions: Return for two weeks po.   Orders:  Orders Placed This Encounter  Procedures  . XR KNEE 3 VIEW RIGHT   No orders of the defined types were placed in this encounter.     Procedures: No procedures performed   Clinical Data: No additional findings.   Subjective: Chief Complaint  Patient presents with  . Right Knee - Pain  . Right Thumb - Pain    HPI patient is a pleasant 52 year old female who presents our clinic today with right thumb triggering.  This is been ongoing for the past 2 weeks and is progressively worsened.  Pain and triggering is worse in the morning.  Of note, she is status post right thumb ligament reconstruction tendon interposition as well as right carpal tunnel release from [facility] arthritis, date of surgery 03/10/2017.  Doing well until this recent triggering.  Also of note, she is status post left thumb trigger finger release last year.  Doing well with that.  No previous cortisone injections to the trigger thumbs as she has severe allergic reactions to these.  Also complaining of right knee achy pain chronically that's worse with activity.  Review of Systems as detailed in HPI.  All  others are negative.   Objective: Vital Signs: LMP 06/23/2013   Physical Exam well-developed well-nourished female no acute distress.  Alert and oriented x3.  Ortho Exam examination of her right thumb reveals marked tenderness and a palpable nodule at the A1 pulley.  Marked triggering.  Negative grind and negative Finkelstein.  She is rest intact distally.  Specialty Comments:  No specialty comments available.  Imaging: No new imaging.   PMFS History: Patient Active Problem List   Diagnosis Date Noted  . Trigger finger of right thumb 02/18/2018  . Right leg pain 08/16/2017  . Tenosynovitis of finger 05/26/2017  . Trigger thumb, left thumb 05/18/2017  . Arthritis of carpometacarpal [facility]) joint of right thumb 03/10/2017  . Essential hypertension 03/04/2017  . Healthcare maintenance 03/04/2017  . Depression 03/09/2016  . Carpal tunnel syndrome 03/09/2016  . Diabetes mellitus, type 2 ([place]) 08/15/2014  . Poor dental hygiene 08/15/2014  . Sciatica 05/31/2012  . OBESITY, UNSPECIFIED 03/07/2010  . TOBACCO ABUSE 03/07/2010  . PAIN IN JOINT, MULTIPLE SITES 03/07/2010   Past Medical History:  Diagnosis Date  . Anxiety   . Arthritis   . Back pain   . Depression   . Diabetes mellitus without complication ([place])   . GERD (gastroesophageal reflux disease)   . Hyperlipidemia   . Hypertension  Family History  Problem Relation Age of Onset  . Diabetes Mother   . Diabetes Sister     Past Surgical History:  Procedure Laterality Date  . CARPAL TUNNEL RELEASE Right 03/10/2017   Procedure: CARPAL TUNNEL RELEASE;  Surgeon: Leandrew Koyanagi, MD;  Location: [place];  Service: Orthopedics;  Laterality: Right;  . CARPOMETACARPEL SUSPENSION PLASTY Right 03/10/2017   Procedure: Right thumb Ligament Reconstruction Tendon Interposition, Right Carpal tunnel release;  Surgeon: Leandrew Koyanagi, MD;  Location: [place];  Service: Orthopedics;  Laterality: Right;  .  TRIGGER FINGER RELEASE Left 05/26/2017   Procedure: RELEASE TRIGGER FINGER LEFT THUMB;  Surgeon: Leandrew Koyanagi, MD;  Location: [place];  Service: Orthopedics;  Laterality: Left;  . TUBAL LIGATION     Social History   Occupational History  . Not on file  Tobacco Use  . Smoking status: Current Every Day Smoker    Packs/day: 0.25    Types: Cigarettes  . Smokeless tobacco: Never Used  . Tobacco comment: using electronic cigs with regular cigs  Substance and Sexual Activity  . Alcohol use: Yes    Alcohol/week: 0.0 oz    Comment: rarely  . Drug use: No  . Sexual activity: Yes    Birth control/protection: Surgical

## 2018-02-21 ENCOUNTER — Other Ambulatory Visit: Payer: Self-pay

## 2018-02-21 ENCOUNTER — Encounter (HOSPITAL_BASED_OUTPATIENT_CLINIC_OR_DEPARTMENT_OTHER): Payer: Self-pay | Admitting: *Deleted

## 2018-02-22 ENCOUNTER — Encounter (HOSPITAL_BASED_OUTPATIENT_CLINIC_OR_DEPARTMENT_OTHER)
Admission: RE | Admit: 2018-02-22 | Discharge: 2018-02-22 | Disposition: A | Discharge status: Home / Self Care | Payer: No Typology Code available for payment source | Source: Ambulatory Visit | Attending: Orthopaedic Surgery | Admitting: Orthopaedic Surgery

## 2018-02-22 LAB — BASIC METABOLIC PANEL
Anion gap: 11 (ref 5–15)
BUN: 10 mg/dL (ref 6–20)
CALCIUM: 9.6 mg/dL (ref 8.9–10.3)
CO2: 26 mmol/L (ref 22–32)
Chloride: 103 mmol/L (ref 101–111)
Creatinine, Ser: 0.74 mg/dL (ref 0.44–1.00)
GFR calc non Af Amer: >60 mL/min (ref >60)
Glucose, Bld: 117 mg/dL — ABNORMAL HIGH (ref 65–99)
Potassium: 4.6 mmol/L (ref 3.5–5.1)
SODIUM: 140 mmol/L (ref 135–145)

## 2018-02-23 ENCOUNTER — Ambulatory Visit (HOSPITAL_BASED_OUTPATIENT_CLINIC_OR_DEPARTMENT_OTHER): Payer: Self-pay | Admitting: Anesthesiology

## 2018-02-23 ENCOUNTER — Ambulatory Visit (HOSPITAL_BASED_OUTPATIENT_CLINIC_OR_DEPARTMENT_OTHER)
Admission: RE | Admit: 2018-02-23 | Discharge: 2018-02-23 | Disposition: A | Discharge status: Home / Self Care | Payer: Self-pay | Source: Ambulatory Visit | Attending: Orthopaedic Surgery | Admitting: Orthopaedic Surgery

## 2018-02-23 ENCOUNTER — Other Ambulatory Visit: Payer: Self-pay

## 2018-02-23 ENCOUNTER — Encounter (HOSPITAL_BASED_OUTPATIENT_CLINIC_OR_DEPARTMENT_OTHER)
Admission: RE | Disposition: A | Discharge status: Home / Self Care | Payer: Self-pay | Source: Ambulatory Visit | Attending: Orthopaedic Surgery

## 2018-02-23 ENCOUNTER — Encounter (HOSPITAL_BASED_OUTPATIENT_CLINIC_OR_DEPARTMENT_OTHER): Payer: Self-pay | Admitting: Anesthesiology

## 2018-02-23 DIAGNOSIS — F419 Anxiety disorder, unspecified: Secondary | ICD-10-CM | POA: Insufficient documentation

## 2018-02-23 DIAGNOSIS — I10 Essential (primary) hypertension: Secondary | ICD-10-CM | POA: Insufficient documentation

## 2018-02-23 DIAGNOSIS — M65311 Trigger thumb, right thumb: Secondary | ICD-10-CM | POA: Insufficient documentation

## 2018-02-23 DIAGNOSIS — F17210 Nicotine dependence, cigarettes, uncomplicated: Secondary | ICD-10-CM | POA: Insufficient documentation

## 2018-02-23 DIAGNOSIS — Z885 Allergy status to narcotic agent status: Secondary | ICD-10-CM | POA: Insufficient documentation

## 2018-02-23 DIAGNOSIS — E785 Hyperlipidemia, unspecified: Secondary | ICD-10-CM | POA: Insufficient documentation

## 2018-02-23 DIAGNOSIS — M659 Synovitis and tenosynovitis, unspecified: Secondary | ICD-10-CM

## 2018-02-23 DIAGNOSIS — Z7982 Long term (current) use of aspirin: Secondary | ICD-10-CM | POA: Insufficient documentation

## 2018-02-23 DIAGNOSIS — E119 Type 2 diabetes mellitus without complications: Secondary | ICD-10-CM | POA: Insufficient documentation

## 2018-02-23 DIAGNOSIS — K219 Gastro-esophageal reflux disease without esophagitis: Secondary | ICD-10-CM | POA: Insufficient documentation

## 2018-02-23 DIAGNOSIS — F329 Major depressive disorder, single episode, unspecified: Secondary | ICD-10-CM | POA: Insufficient documentation

## 2018-02-23 DIAGNOSIS — Z7984 Long term (current) use of oral hypoglycemic drugs: Secondary | ICD-10-CM | POA: Insufficient documentation

## 2018-02-23 DIAGNOSIS — Z79899 Other long term (current) drug therapy: Secondary | ICD-10-CM | POA: Insufficient documentation

## 2018-02-23 HISTORY — PX: TRIGGER FINGER RELEASE: SHX641

## 2018-02-23 LAB — GLUCOSE, CAPILLARY
GLUCOSE-CAPILLARY: 133 mg/dL — AB (ref 65–99)
GLUCOSE-CAPILLARY: 109 mg/dL — AB (ref 65–99)

## 2018-02-23 SURGERY — RELEASE, A1 PULLEY, FOR TRIGGER FINGER
Anesthesia: Regional | Site: Thumb | Laterality: Right

## 2018-02-23 MED ORDER — BUPIVACAINE HCL (PF) 0.25 % IJ SOLN
INTRAMUSCULAR | Status: DC | PRN
  Administered 2018-02-23: 6 mL

## 2018-02-23 MED ORDER — SCOPOLAMINE 1 MG/3DAYS TD PT72
MEDICATED_PATCH | TRANSDERMAL | Status: AC
  Filled 2018-02-23: qty 1

## 2018-02-23 MED ORDER — DEXAMETHASONE SODIUM PHOSPHATE 10 MG/ML IJ SOLN
INTRAMUSCULAR | Status: AC
  Filled 2018-02-23: qty 1

## 2018-02-23 MED ORDER — FENTANYL CITRATE (PF) 100 MCG/2ML IJ SOLN
50.0000 ug | INTRAMUSCULAR | Status: DC | PRN
  Administered 2018-02-23: 50 ug via INTRAVENOUS

## 2018-02-23 MED ORDER — MIDAZOLAM HCL 2 MG/2ML IJ SOLN
INTRAMUSCULAR | Status: AC
Start: 2018-02-23 — End: 2018-02-23
  Filled 2018-02-23: qty 2

## 2018-02-23 MED ORDER — SCOPOLAMINE 1 MG/3DAYS TD PT72
1.0000 | MEDICATED_PATCH | Freq: Once | TRANSDERMAL | Status: DC | PRN
  Administered 2018-02-23: 1.5 mg via TRANSDERMAL

## 2018-02-23 MED ORDER — LACTATED RINGERS IV SOLN
INTRAVENOUS | Status: DC
  Administered 2018-02-23: 12:00:00 via INTRAVENOUS

## 2018-02-23 MED ORDER — ROPIVACAINE HCL 5 MG/ML IJ SOLN: INTRAMUSCULAR | Status: DC | PRN

## 2018-02-23 MED ORDER — SENNOSIDES-DOCUSATE SODIUM 8.6-50 MG PO TABS: 1.0000 | ORAL_TABLET | Freq: Every evening | ORAL | 1 refills | Status: DC | PRN

## 2018-02-23 MED ORDER — CEFAZOLIN SODIUM-DEXTROSE 2-4 GM/100ML-% IV SOLN
2.0000 g | INTRAVENOUS | Status: AC
  Administered 2018-02-23: 2 g via INTRAVENOUS

## 2018-02-23 MED ORDER — MIDAZOLAM HCL 2 MG/2ML IJ SOLN
1.0000 mg | INTRAMUSCULAR | Status: DC | PRN
  Administered 2018-02-23 (×2): 1 mg via INTRAVENOUS

## 2018-02-23 MED ORDER — BUPIVACAINE HCL (PF) 0.25 % IJ SOLN
INTRAMUSCULAR | Status: AC
Start: 2018-02-23 — End: 2018-02-23
  Filled 2018-02-23: qty 30

## 2018-02-23 MED ORDER — PROPOFOL 10 MG/ML IV BOLUS
INTRAVENOUS | Status: AC
  Filled 2018-02-23: qty 20

## 2018-02-23 MED ORDER — CEFAZOLIN SODIUM-DEXTROSE 2-4 GM/100ML-% IV SOLN
INTRAVENOUS | Status: AC
Start: 2018-02-23 — End: 2018-02-23
  Filled 2018-02-23: qty 100

## 2018-02-23 MED ORDER — CHLORHEXIDINE GLUCONATE 4 % EX LIQD: 60.0000 mL | Freq: Once | CUTANEOUS | Status: DC

## 2018-02-23 MED ORDER — FENTANYL CITRATE (PF) 100 MCG/2ML IJ SOLN
INTRAMUSCULAR | Status: AC
Start: 2018-02-23 — End: 2018-02-23
  Filled 2018-02-23: qty 2

## 2018-02-23 MED ORDER — PROPOFOL 500 MG/50ML IV EMUL
INTRAVENOUS | Status: DC | PRN
  Administered 2018-02-23: 75 ug/kg/min via INTRAVENOUS

## 2018-02-23 MED ORDER — OXYCODONE-ACETAMINOPHEN 5-325 MG PO TABS: 1.0000 | ORAL_TABLET | Freq: Four times a day (QID) | ORAL | 0 refills | Status: DC | PRN

## 2018-02-23 MED ORDER — LIDOCAINE HCL (PF) 0.5 % IJ SOLN
INTRAMUSCULAR | Status: DC | PRN
  Administered 2018-02-23: 45 mL via INTRAVENOUS

## 2018-02-23 MED ORDER — LIDOCAINE HCL (CARDIAC) 20 MG/ML IV SOLN
INTRAVENOUS | Status: DC | PRN
  Administered 2018-02-23: 50 mg via INTRAVENOUS

## 2018-02-23 MED ORDER — ONDANSETRON HCL 4 MG/2ML IJ SOLN
INTRAMUSCULAR | Status: AC
  Filled 2018-02-23: qty 2

## 2018-02-23 MED ORDER — ONDANSETRON HCL 4 MG/2ML IJ SOLN
INTRAMUSCULAR | Status: DC | PRN
  Administered 2018-02-23: 4 mg via INTRAVENOUS

## 2018-02-23 MED ORDER — LACTATED RINGERS IV SOLN
INTRAVENOUS | Status: DC
  Administered 2018-02-23: 11:00:00 via INTRAVENOUS

## 2018-02-23 SURGICAL SUPPLY — 49 items
BANDAGE ACE 3X5.8 VEL STRL LF (GAUZE/BANDAGES/DRESSINGS) ×3 IMPLANT
BLADE MINI RND TIP GREEN BEAV (BLADE) IMPLANT
BLADE SURG 15 STRL LF DISP TIS (BLADE) ×1 IMPLANT
BLADE SURG 15 STRL SS (BLADE) ×3
BNDG CMPR 9X4 STRL LF SNTH (GAUZE/BANDAGES/DRESSINGS)
BNDG ESMARK 4X9 LF (GAUZE/BANDAGES/DRESSINGS) IMPLANT
BRUSH SCRUB EZ PLAIN DRY (MISCELLANEOUS) ×3 IMPLANT
CANISTER SUCT 1200ML W/VALVE (MISCELLANEOUS) ×1 IMPLANT
CORD BIPOLAR FORCEPS 12FT (ELECTRODE) ×3 IMPLANT
COVER BACK TABLE 60X90IN (DRAPES) ×3 IMPLANT
COVER MAYO STAND STRL (DRAPES) ×3 IMPLANT
CUFF TOURNIQUET SINGLE 18IN (TOURNIQUET CUFF) ×3 IMPLANT
DECANTER SPIKE VIAL GLASS SM (MISCELLANEOUS) IMPLANT
DRAPE EXTREMITY T 121X128X90 (DRAPE) ×3 IMPLANT
DRAPE SURG 17X23 STRL (DRAPES) ×3 IMPLANT
GAUZE SPONGE 4X4 12PLY STRL (GAUZE/BANDAGES/DRESSINGS) ×3 IMPLANT
GAUZE XEROFORM 1X8 LF (GAUZE/BANDAGES/DRESSINGS) ×3 IMPLANT
GLOVE BIO SURGEON STRL SZ 6.5 (GLOVE) ×1 IMPLANT
GLOVE BIO SURGEONS STRL SZ 6.5 (GLOVE) ×1
GLOVE BIOGEL PI IND STRL 7.0 (GLOVE) ×1 IMPLANT
GLOVE BIOGEL PI INDICATOR 7.0 (GLOVE) ×4
GLOVE ECLIPSE 7.0 STRL STRAW (GLOVE) ×3 IMPLANT
GLOVE EXAM NITRILE MD LF STRL (GLOVE) ×2 IMPLANT
GLOVE SKINSENSE NS SZ7.5 (GLOVE) ×2
GLOVE SKINSENSE STRL SZ7.5 (GLOVE) ×1 IMPLANT
GLOVE SURG SYN 7.5  E (GLOVE) ×2
GLOVE SURG SYN 7.5 E (GLOVE) ×1 IMPLANT
GLOVE SURG SYN 7.5 PF PI (GLOVE) ×1 IMPLANT
GOWN STRL REIN XL XLG (GOWN DISPOSABLE) ×3 IMPLANT
GOWN STRL REUS W/ TWL LRG LVL3 (GOWN DISPOSABLE) IMPLANT
GOWN STRL REUS W/ TWL XL LVL3 (GOWN DISPOSABLE) ×1 IMPLANT
GOWN STRL REUS W/TWL LRG LVL3 (GOWN DISPOSABLE) ×3
GOWN STRL REUS W/TWL XL LVL3 (GOWN DISPOSABLE) ×3
NDL HYPO 25X1 1.5 SAFETY (NEEDLE) ×1 IMPLANT
NEEDLE HYPO 25X1 1.5 SAFETY (NEEDLE) ×3 IMPLANT
NS IRRIG 1000ML POUR BTL (IV SOLUTION) ×3 IMPLANT
PACK BASIN DAY SURGERY FS (CUSTOM PROCEDURE TRAY) ×3 IMPLANT
PAD CAST 3X4 CTTN HI CHSV (CAST SUPPLIES) ×1 IMPLANT
PADDING CAST COTTON 3X4 STRL (CAST SUPPLIES)
RUBBERBAND STERILE (MISCELLANEOUS) ×6 IMPLANT
STOCKINETTE 4X48 STRL (DRAPES) ×3 IMPLANT
SUT ETHILON 4 0 PS 2 18 (SUTURE) ×3 IMPLANT
SYR BULB 3OZ (MISCELLANEOUS) ×3 IMPLANT
SYR CONTROL 10ML LL (SYRINGE) ×3 IMPLANT
TOWEL OR 17X24 6PK STRL BLUE (TOWEL DISPOSABLE) ×3 IMPLANT
TRAY DSU PREP LF (CUSTOM PROCEDURE TRAY) ×3 IMPLANT
TUBE CONNECTING 20'X1/4 (TUBING) ×1
TUBE CONNECTING 20X1/4 (TUBING) ×2 IMPLANT
UNDERPAD 30X30 (UNDERPADS AND DIAPERS) ×3 IMPLANT

## 2018-02-23 NOTE — H&P (Signed)
PREOPERATIVE H&P  Chief Complaint: right trigger thumb  HPI: Melanie Phillips is a 52 y.o. female who presents for surgical treatment of right trigger thumb.  She denies any changes in medical history.  Past Medical History:  Diagnosis Date  . Anxiety   . Arthritis   . Back pain   . Depression   . Diabetes mellitus without complication ([place])   . GERD (gastroesophageal reflux disease)   . Hyperlipidemia   . Hypertension    Past Surgical History:  Procedure Laterality Date  . CARPAL TUNNEL RELEASE Right 03/10/2017   Procedure: CARPAL TUNNEL RELEASE;  Surgeon: Leandrew Koyanagi, MD;  Location: [place];  Service: Orthopedics;  Laterality: Right;  . CARPOMETACARPEL SUSPENSION PLASTY Right 03/10/2017   Procedure: Right thumb Ligament Reconstruction Tendon Interposition, Right Carpal tunnel release;  Surgeon: Leandrew Koyanagi, MD;  Location: [place];  Service: Orthopedics;  Laterality: Right;  . TRIGGER FINGER RELEASE Left 05/26/2017   Procedure: RELEASE TRIGGER FINGER LEFT THUMB;  Surgeon: Leandrew Koyanagi, MD;  Location: [place];  Service: Orthopedics;  Laterality: Left;  . TUBAL LIGATION     Social History   Socioeconomic History  . Marital status: Single    Spouse name: Not on file  . Number of children: Not on file  . Years of education: Not on file  . Highest education level: Not on file  Occupational History  . Not on file  Social Needs  . Financial resource strain: Not on file  . Food insecurity:    Worry: Not on file    Inability: Not on file  . Transportation needs:    Medical: Not on file    Non-medical: Not on file  Tobacco Use  . Smoking status: Current Every Day Smoker    Packs/day: 0.25    Types: Cigarettes  . Smokeless tobacco: Never Used  . Tobacco comment: using electronic cigs with regular cigs  Substance and Sexual Activity  . Alcohol use: Yes    Alcohol/week: 0.0 oz    Comment: rarely  . Drug use: No    . Sexual activity: Yes    Birth control/protection: Surgical  Lifestyle  . Physical activity:    Days per week: Not on file    Minutes per session: Not on file  . Stress: Not on file  Relationships  . Social connections:    Talks on phone: Not on file    Gets together: Not on file    Attends religious service: Not on file    Active member of club or organization: Not on file    Attends meetings of clubs or organizations: Not on file    Relationship status: Not on file  Other Topics Concern  . Not on file  Social History Narrative  . Not on file   Family History  Problem Relation Age of Onset  . Diabetes Mother   . Diabetes Sister    Allergies  Allergen Reactions  . Vicodin [Hydrocodone-Acetaminophen] Nausea And Vomiting   Prior to Admission medications   Medication Sig Start Date End Date Taking? Authorizing Provider  aspirin EC 81 MG tablet Take 1 tablet (81 mg total) by mouth daily. 08/15/14  Yes Luiz Blare Y, DO  cyclobenzaprine (FLEXERIL) 10 MG tablet Take 1 tablet (10 mg total) by mouth 3 (three) times daily as needed for muscle spasms. 01/21/18  Yes Riccio, Levada Dy C, DO  DULoxetine (CYMBALTA) 30 MG capsule Take 1 capsule (  30 mg total) by mouth daily. 01/21/18  Yes Riccio, Angela C, DO  lisinopril (PRINIVIL,ZESTRIL) 5 MG tablet Take 1 tablet (5 mg total) by mouth daily. 12/03/17  Yes Riccio, Angela C, DO  metFORMIN (GLUCOPHAGE) 1000 MG tablet TAKE 1 TABLET BY MOUTH TWICE DAILY WITH A MEAL 12/13/17  Yes Riccio, Angela C, DO  oxyCODONE-acetaminophen (PERCOCET/ROXICET) 5-325 MG tablet Take by mouth every 4 (four) hours as needed for severe pain.   Yes [provider]  ACCU-CHEK FASTCLIX LANCETS MISC Use as needed to check blood sugar. 08/13/14   Rumley, Burna Cash, DO  Blood Glucose Monitoring Suppl (ACCU-CHEK NANO SMARTVIEW) W/DEVICE KIT 1 kit by Does not apply route 4 (four) times daily -  before meals and at bedtime. 08/13/14   Rumley, Burna Cash, DO  glucose blood  (ACCU-CHEK SMARTVIEW) test strip Use as instructed 02/17/16   Katheren Shams, DO  metroNIDAZOLE (FLAGYL) 500 MG tablet Take 1 tablet (500 mg total) by mouth 2 (two) times daily. 03/08/17   Katheren Shams, DO  omeprazole (PRILOSEC) 40 MG capsule Take 1 capsule (40 mg total) by mouth daily. 05/19/17   Katheren Shams, DO     Positive ROS: All other systems have been reviewed and were otherwise negative with the exception of those mentioned in the HPI and as above.  Physical Exam: General: Alert, no acute distress Cardiovascular: No pedal edema Respiratory: No cyanosis, no use of accessory musculature GI: abdomen soft Skin: No lesions in the area of chief complaint Neurologic: Sensation intact distally Psychiatric: Patient is competent for consent with normal mood and affect Lymphatic: no lymphedema  MUSCULOSKELETAL: exam stable  Assessment: right trigger thumb  Plan: Plan for Procedure(s): RELEASE RIGHT TRIGGER THUMB  The risks benefits and alternatives were discussed with the patient including but not limited to the risks of nonoperative treatment, versus surgical intervention including infection, bleeding, nerve injury,  blood clots, cardiopulmonary complications, morbidity, mortality, among others, and they were willing to proceed.   Eduard Roux, MD   02/23/2018 10:57 AM

## 2018-02-23 NOTE — Discharge Instructions (Signed)
Post Anesthesia Home Care Instructions  Activity: Get plenty of rest for the remainder of the day. A responsible individual must stay with you for 24 hours following the procedure.  For the next 24 hours, DO NOT: -Drive a car -Paediatric nurse -Drink alcoholic beverages -Take any medication unless instructed by your physician -Make any legal decisions or sign important papers.  Meals: Start with liquid foods such as gelatin or soup. Progress to regular foods as tolerated. Avoid greasy, spicy, heavy foods. If nausea and/or vomiting occur, drink only clear liquids until the nausea and/or vomiting subsides. Call your physician if vomiting continues.  Special Instructions/Symptoms: Your throat may feel dry or sore from the anesthesia or the breathing tube placed in your throat during surgery. If this causes discomfort, gargle with warm salt water. The discomfort should disappear within 24 hours.  If you had a scopolamine patch placed behind your ear for the management of post- operative nausea and/or vomiting:  1. The medication in the patch is effective for 72 hours, after which it should be removed.  Wrap patch in a tissue and discard in the trash. Wash hands thoroughly with soap and water. 2. You may remove the patch earlier than 72 hours if you experience unpleasant side effects which may include dry mouth, dizziness or visual disturbances. 3. Avoid touching the patch. Wash your hands with soap and water after contact with the patch.   Postoperative instructions:  Weightbearing instructions: as tolerated.  No heavy lifting for 4 weeks  Keep your dressing and/or splint clean and dry at all times.  You can remove your dressing on post-operative day #3 and change with a dry/sterile dressing or Band-Aids as needed thereafter.    Incision instructions:  Do not soak your incision for 3 weeks after surgery.  If the incision gets wet, pat dry and do not scrub the incision.  Pain control:   You have been given a prescription to be taken as directed for post-operative pain control.  In addition, elevate the operative extremity above the heart at all times to prevent swelling and throbbing pain.  Take over-the-counter Colace, 100mg  by mouth twice a day while taking narcotic pain medications to help prevent constipation.  Follow up appointments: 1) 10-14 days for suture removal and wound check. 2) Dr. Erlinda Hong as scheduled.   -------------------------------------------------------------------------------------------------------------  After Surgery Pain Control:  After your surgery, post-surgical discomfort or pain is likely. This discomfort can last several days to a few weeks. At certain times of the day your discomfort may be more intense.  Did you receive a nerve block?  A nerve block can provide pain relief for one hour to two days after your surgery. As long as the nerve block is working, you will experience little or no sensation in the area the surgeon operated on.  As the nerve block wears off, you will begin to experience pain or discomfort. It is very important that you begin taking your prescribed pain medication before the nerve block fully wears off. Treating your pain at the first sign of the block wearing off will ensure your pain is better controlled and more tolerable when full-sensation returns. Do not wait until the pain is intolerable, as the medicine will be less effective. It is better to treat pain in advance than to try and catch up.  General Anesthesia:  If you did not receive a nerve block during your surgery, you will need to start taking your pain medication shortly after your surgery and  should continue to do so as prescribed by your surgeon.  Pain Medication:  Most commonly we prescribe Vicodin and Percocet for post-operative pain. Both of these medications contain a combination of acetaminophen (Tylenol) and a narcotic to help control pain.   It takes  between 30 and 45 minutes before pain medication starts to work. It is important to take your medication before your pain level gets too intense.   Nausea is a common side effect of many pain medications. You will want to eat something before taking your pain medicine to help prevent nausea.   If you are taking a prescription pain medication that contains acetaminophen, we recommend that you do not take additional over the counter acetaminophen (Tylenol).  Other pain relieving options:   Using a cold pack to ice the affected area a few times a day (15 to 20 minutes at a time) can help to relieve pain, reduce swelling and bruising.   Elevation of the affected area can also help to reduce pain and swelling.

## 2018-02-23 NOTE — Anesthesia Preprocedure Evaluation (Signed)
Anesthesia Evaluation  Patient identified by MRN, date of birth, ID band Patient awake    Reviewed: Allergy & Precautions, NPO status , Patient's Chart, lab work & pertinent test results  Airway Mallampati: II  TM Distance: >3 FB Neck ROM: Full    Dental  (+) Teeth Intact, Dental Advisory Given, Missing,    Pulmonary Current Smoker,    Pulmonary exam normal breath sounds clear to auscultation       Cardiovascular hypertension, Pt. on medications Normal cardiovascular exam Rhythm:Regular Rate:Normal     Neuro/Psych PSYCHIATRIC DISORDERS Anxiety Depression negative neurological ROS     GI/Hepatic Neg liver ROS, GERD  Medicated,  Endo/Other  diabetes, Type 2, Oral Hypoglycemic AgentsObesity   Renal/GU negative Renal ROS     Musculoskeletal  (+) Arthritis , Osteoarthritis,    Abdominal   Peds  Hematology negative hematology ROS (+)   Anesthesia Other Findings Day of surgery medications reviewed with the patient.  Reproductive/Obstetrics                             Anesthesia Physical  Anesthesia Plan  ASA: II  Anesthesia Plan:    Post-op Pain Management:    Induction: Intravenous  PONV Risk Score and Plan: 1 and Ondansetron  Airway Management Planned: Nasal Cannula  Additional Equipment:   Intra-op Plan:   Post-operative Plan:   Informed Consent: I have reviewed the patients History and Physical, chart, labs and discussed the procedure including the risks, benefits and alternatives for the proposed anesthesia with the patient or authorized representative who has indicated his/her understanding and acceptance.   Dental advisory given  Plan Discussed with:   Anesthesia Plan Comments: (Risks/benefits of regional block discussed with patient including risk of bleeding, infection, nerve damage, and possibility of failed block.  Also discussed backup plan of general anesthesia and  associated risks.  Patient wishes to proceed.)        Anesthesia Quick Evaluation

## 2018-02-23 NOTE — Transfer of Care (Signed)
Immediate Anesthesia Transfer of Care Note  Patient: Melanie Phillips  Procedure(s) Performed: RELEASE RIGHT TRIGGER THUMB (Right Thumb)  Patient Location: PACU  Anesthesia Type:Bier block  Level of Consciousness: awake, sedated and patient cooperative  Airway & Oxygen Therapy: Patient Spontanous Breathing  Post-op Assessment: Report given to RN and Post -op Vital signs reviewed and stable  Post vital signs: Reviewed and stable  Last Vitals:  Vitals Value Taken Time  BP    Temp    Pulse 86 02/23/2018 12:20 PM  Resp 17 02/23/2018 12:20 PM  SpO2 96 % 02/23/2018 12:20 PM  Vitals shown include unvalidated device data.  Last Pain:  Vitals:   02/23/18 1106  TempSrc: Oral         Complications: No apparent anesthesia complications

## 2018-02-23 NOTE — Anesthesia Procedure Notes (Deleted)
Anesthesia Regional Block: TAP block   Pre-Anesthetic Checklist: ,, timeout performed, Correct Patient, Correct Site, Correct Laterality, Correct Procedure, Correct Position, site marked, Risks and benefits discussed,  Surgical consent,  Pre-op evaluation,  At surgeon's request and post-op pain management  Laterality: Left and Right  Prep: chloraprep       Needles:  Injection technique: Single-shot  Needle Type: Echogenic Stimulator Needle     Needle Length: 5cm  Needle Gauge: 22     Additional Needles:   Procedures:, nerve stimulator,,, ultrasound used (permanent image in chart),,,,  Narrative:  Start time: 02/23/2018 10:47 AM End time: 02/23/2018 11:00 AM Injection made incrementally with aspirations every 5 mL.  Performed by: Personally  Anesthesiologist: Janeece Riggers, MD  Additional Notes: Functioning IV was confirmed and monitors were applied.  A 33mm 22ga Arrow echogenic stimulator needle was used. Sterile prep and drape,hand hygiene and sterile gloves were used. Ultrasound guidance: relevant anatomy identified, needle position confirmed, local anesthetic spread visualized around nerve(s)., vascular puncture avoided.  Image printed for medical record. Negative aspiration and negative test dose prior to incremental administration of local anesthetic. The patient tolerated the procedure well.

## 2018-02-23 NOTE — Op Note (Signed)
   Date of Surgery: 02/23/2018  INDICATIONS: Ms. Swander is a 52 y.o.-year-old female who presents for surgical treatment of a right trigger thumb;  The patient did consent to the procedure after discussion of the risks and benefits.  PREOPERATIVE DIAGNOSIS: stenosing flexor tenosynovitis of right thumb  POSTOPERATIVE DIAGNOSIS: Same.  PROCEDURE:  1. Incision of A1 pulley for trigger finger release, right thumb 2. Tenolysis of right FPL tendon  SURGEON: N. Eduard Roux, M.D.  ASSIST: Ciro Backer [place], [place]; necessary for the timely completion of procedure and due to complexity of procedure..  ANESTHESIA:  Bier block  IV FLUIDS AND URINE: See anesthesia.  ESTIMATED BLOOD LOSS: minimal mL  COMPLICATIONS: None.  DESCRIPTION OF PROCEDURE: The patient was identified in the preoperative holding area. The operative site was marked by the surgeon confirmed with the patient. He is brought back to the operating room. She was placed supine on table. A nonsterile tourniquet was placed on the upper forearm. The extremity was exsanguinated using Esmarch bandage and the tourniquet was inflated to 250 mmHg. The Bier block was administered. The operative extremity was prepped and draped in standard sterile fashion. Timeout was performed. Antibiotics were given. Timeout was performed. A horizontal incision based in the palmar crease in line with the thumb was used. Blunt dissection was taken down to the level of the flexor tendon. The neurovascular bundles were identified on each side of the tendon sheath and protected. The possible edge of the A1 pulley was identified. This was sharply incised. Of note the tendon was of good quality and did not exhibit any tears. The A1 pulley was incised along its full width. Care was taken not to violate the A2 pulley. The palmar pulley was then visualized and released also.  Tenolysis of the FPL tendon was performed using metzenbaum scissors. The tourniquet was then  deflated and hemostasis was obtained. Local anesthesia was infiltrated. The wound was thoroughly irrigated and closed with 3-0 nylon sutures. Sterile dressings were applied and the hand was placed in a soft dressing. Patient tolerated the procedure well and was taken to the PACU in stable condition.  POSTOPERATIVE PLAN: Patient will be weight bearing as tolerated and to avoid heavy lifting for 4 weeks.    Azucena Cecil, MD [facility] [phone] 12:03 PM

## 2018-02-23 NOTE — Anesthesia Postprocedure Evaluation (Signed)
Anesthesia Post Note  Patient: Melanie Phillips  Procedure(s) Performed: RELEASE RIGHT TRIGGER THUMB (Right Thumb)     Patient location during evaluation: PACU Anesthesia Type: MAC Level of consciousness: awake and alert Pain management: pain level controlled Vital Signs Assessment: post-procedure vital signs reviewed and stable Respiratory status: spontaneous breathing, nonlabored ventilation, respiratory function stable and patient connected to nasal cannula oxygen Cardiovascular status: stable and blood pressure returned to baseline Postop Assessment: no apparent nausea or vomiting Anesthetic complications: no    Last Vitals:  Vitals:   02/23/18 1106 02/23/18 1219  BP: (!) 157/85   Pulse: 88 86  Resp: 18 17  Temp: 36.9 C 37 C  SpO2: 98% 96%    Last Pain:  Vitals:   02/23/18 1106  TempSrc: Oral                 Merril Isakson

## 2018-02-24 ENCOUNTER — Encounter (HOSPITAL_BASED_OUTPATIENT_CLINIC_OR_DEPARTMENT_OTHER): Payer: Self-pay | Admitting: Orthopaedic Surgery

## 2018-03-14 ENCOUNTER — Ambulatory Visit (INDEPENDENT_AMBULATORY_CARE_PROVIDER_SITE_OTHER): Payer: Self-pay | Admitting: Orthopaedic Surgery

## 2018-03-14 ENCOUNTER — Encounter (INDEPENDENT_AMBULATORY_CARE_PROVIDER_SITE_OTHER): Payer: Self-pay | Admitting: Orthopaedic Surgery

## 2018-03-14 DIAGNOSIS — M65311 Trigger thumb, right thumb: Secondary | ICD-10-CM

## 2018-03-14 MED ORDER — OXYCODONE-ACETAMINOPHEN 5-325 MG PO TABS: 1.0000 | ORAL_TABLET | Freq: Every day | ORAL | 0 refills | Status: DC | PRN

## 2018-03-14 MED ORDER — MUPIROCIN 2 % EX OINT: 1.0000 "application " | TOPICAL_OINTMENT | Freq: Two times a day (BID) | CUTANEOUS | 0 refills | Status: DC

## 2018-03-14 NOTE — Progress Notes (Signed)
Patient is two-week status post trigger thumb release.  She is doing well overall.  She says that she saw a small amount of pus come out of the incision last night.  Denies any constitutional symptoms.  On exam today the incision is well-healed.  There is no fluctuance or cellulitis or signs of infection.  There is no drainage.  The incisions completely healed and dry.  Sutures removed today.  Bactroban ointment twice a day to help facilitate scar healing.  Questions encouraged and answered.  Follow-up in 6 weeks for recheck.

## 2018-03-25 ENCOUNTER — Ambulatory Visit: Payer: No Typology Code available for payment source

## 2018-03-25 ENCOUNTER — Other Ambulatory Visit: Payer: No Typology Code available for payment source

## 2018-03-30 ENCOUNTER — Other Ambulatory Visit: Payer: Self-pay

## 2018-03-30 MED ORDER — ACCU-CHEK FASTCLIX LANCETS MISC: 5 refills | Status: DC

## 2018-03-30 MED ORDER — GLUCOSE BLOOD VI STRP: ORAL_STRIP | 12 refills | Status: DC

## 2018-04-01 ENCOUNTER — Other Ambulatory Visit: Payer: No Typology Code available for payment source

## 2018-04-05 ENCOUNTER — Other Ambulatory Visit (HOSPITAL_COMMUNITY): Payer: Self-pay | Admitting: Obstetrics and Gynecology

## 2018-04-05 DIAGNOSIS — N63 Unspecified lump in unspecified breast: Secondary | ICD-10-CM

## 2018-04-08 ENCOUNTER — Other Ambulatory Visit (HOSPITAL_COMMUNITY): Payer: Self-pay | Admitting: Obstetrics and Gynecology

## 2018-04-08 ENCOUNTER — Ambulatory Visit
Admission: RE | Admit: 2018-04-08 | Discharge: 2018-04-08 | Disposition: A | Discharge status: Home / Self Care | Payer: No Typology Code available for payment source | Source: Ambulatory Visit | Attending: Obstetrics and Gynecology | Admitting: Obstetrics and Gynecology

## 2018-04-08 DIAGNOSIS — N63 Unspecified lump in unspecified breast: Secondary | ICD-10-CM

## 2018-04-08 DIAGNOSIS — N6320 Unspecified lump in the left breast, unspecified quadrant: Secondary | ICD-10-CM

## 2018-04-25 ENCOUNTER — Other Ambulatory Visit: Payer: Self-pay | Admitting: Family Medicine

## 2018-04-28 ENCOUNTER — Ambulatory Visit (INDEPENDENT_AMBULATORY_CARE_PROVIDER_SITE_OTHER): Payer: Self-pay | Admitting: Orthopaedic Surgery

## 2018-06-28 ENCOUNTER — Other Ambulatory Visit: Payer: Self-pay | Admitting: Family Medicine

## 2018-06-28 NOTE — Telephone Encounter (Signed)
Patient needs a new glucometer.  Jazmin Hartsell,CMA

## 2018-06-28 NOTE — Telephone Encounter (Signed)
Pt needs new glucometer,  She also need refills on omeprazole.  She contacted [facility] and there was no Rx for that. [facility] on [address]

## 2018-06-29 MED ORDER — ACCU-CHEK AVIVA PLUS W/DEVICE KIT: 1.0000 | PACK | Freq: Once | 0 refills | Status: AC

## 2018-06-29 MED ORDER — OMEPRAZOLE 40 MG PO CPDR: 40.0000 mg | DELAYED_RELEASE_CAPSULE | Freq: Every day | ORAL | 0 refills | Status: DC

## 2018-06-29 NOTE — Telephone Encounter (Signed)
Sent requested prescriptions into [facility].  Lucila Maine, DO PGY-2, [facility] 06/29/2018 1:56 PM

## 2018-08-11 ENCOUNTER — Ambulatory Visit (INDEPENDENT_AMBULATORY_CARE_PROVIDER_SITE_OTHER): Payer: Medicaid Other

## 2018-08-11 ENCOUNTER — Ambulatory Visit (INDEPENDENT_AMBULATORY_CARE_PROVIDER_SITE_OTHER): Payer: Medicaid Other | Admitting: Orthopaedic Surgery

## 2018-08-11 ENCOUNTER — Encounter (INDEPENDENT_AMBULATORY_CARE_PROVIDER_SITE_OTHER): Payer: Self-pay | Admitting: Orthopaedic Surgery

## 2018-08-11 VITALS — Ht 69.0 in | Wt 253.0 lb

## 2018-08-11 DIAGNOSIS — M1711 Unilateral primary osteoarthritis, right knee: Secondary | ICD-10-CM

## 2018-08-11 NOTE — Progress Notes (Signed)
Office Visit Note   Patient: Melanie Phillips           Date of Birth: [birth year]           MRN: [id number] Visit Date: 08/11/2018              Requested by: Steve Rattler, DO [place], [place] [id number] PCP: Steve Rattler, DO   Assessment & Plan: Visit Diagnoses:  1. Unilateral primary osteoarthritis, right knee     Plan: Impression is status post right thumb A1 pulley release doing well.  #2 right knee primary localized osteoarthritis.  Regards to her right knee, she is exhausted all conservative treatment options and would like to proceed with a right total knee replacement.  Risks, benefits possible occasions reviewed.  Rehab and recovery time discussed.  All questions were answered.  Paperwork complete.  Call with concerns or questions in the meantime.  We will redraw a new hemoglobin A1c today. Total face to face encounter time was greater than 25 minutes and over half of this time was spent in counseling and/or coordination of care.  Follow-Up Instructions: Return for 2 weeks post-op.   Orders:  Orders Placed This Encounter  Procedures  . XR KNEE 3 VIEW RIGHT  . Hemoglobin A1c   No orders of the defined types were placed in this encounter.     Procedures: No procedures performed   Clinical Data: No additional findings.   Subjective: Chief Complaint  Patient presents with  . Right Knee - Pain  . Right Thumb - Pain    HPI patient is a pleasant 52 year old female presents to our clinic today 6 months status post right thumb A1 pulley release.  Doing very well in regards to her thumb.  Other issue she brings up today is her right knee.  History of osteoarthritis which is been ongoing for the past several years.  Has recently worsened.  No known injury or change in activity.  The pain she has is to the entire knee and runs down the anterior aspect of her leg.  She does have associated instability.  Pain is worse going from seated to standing  positions as well as going up and down stairs.  She has tried heat, elevation with pillows as well as oral anti-inflammatories all without relief of symptoms.  She has had a previous intra-articular cortisone injection for which caused significant swelling.  At this point, she is ready to proceed with definitive treatment of a right total knee replacement.  Of note, she is diabetic and her last hemoglobin A1c appears to be over a year ago which was 6.3.  Review of Systems as detailed in HPI.  All others reviewed and are negative.   Objective: Vital Signs: Ht 5\' 9"  (1.753 m)   Wt 253 lb (114.8 kg)   LMP 06/23/2013   BMI 37.36 kg/m   Physical Exam well-developed well-nourished female no acute distress.  Alert and oriented x3.  Ortho Exam examination of her right knee reveals 1+ effusion.  Range of motion 0 to 110 degrees.  Valgus deformity.  Moderate patellofemoral crepitus.  She is stable valgus varus stress.  Moderate joint line tenderness medial and lateral compartment.  She is neurovascular intact distally.  Specialty Comments:  No specialty comments available.  Imaging: Xr Knee 3 View Right  Result Date: 08/11/2018 Severe tricompartmental degenerative changes    PMFS History: Patient Active Problem List   Diagnosis Date Noted  . Unilateral  primary osteoarthritis, right knee 08/11/2018  . Trigger finger of right thumb 02/18/2018  . Right leg pain 08/16/2017  . Tenosynovitis of finger 05/26/2017  . Trigger thumb, left thumb 05/18/2017  . Arthritis of carpometacarpal [facility]) joint of right thumb 03/10/2017  . Essential hypertension 03/04/2017  . Healthcare maintenance 03/04/2017  . Depression 03/09/2016  . Carpal tunnel syndrome 03/09/2016  . Diabetes mellitus, type 2 ([place]) 08/15/2014  . Poor dental hygiene 08/15/2014  . Sciatica 05/31/2012  . OBESITY, UNSPECIFIED 03/07/2010  . TOBACCO ABUSE 03/07/2010  . PAIN IN JOINT, MULTIPLE SITES 03/07/2010   Past Medical History:    Diagnosis Date  . Anxiety   . Arthritis   . Back pain   . Depression   . Diabetes mellitus without complication ([place])   . GERD (gastroesophageal reflux disease)   . Hyperlipidemia   . Hypertension     Family History  Problem Relation Age of Onset  . Diabetes Mother   . Diabetes Sister     Past Surgical History:  Procedure Laterality Date  . CARPAL TUNNEL RELEASE Right 03/10/2017   Procedure: CARPAL TUNNEL RELEASE;  Surgeon: Leandrew Koyanagi, MD;  Location: [facility];  Service: Orthopedics;  Laterality: Right;  . CARPOMETACARPEL SUSPENSION PLASTY Right 03/10/2017   Procedure: Right thumb Ligament Reconstruction Tendon Interposition, Right Carpal tunnel release;  Surgeon: Leandrew Koyanagi, MD;  Location: [facility];  Service: Orthopedics;  Laterality: Right;  . TRIGGER FINGER RELEASE Left 05/26/2017   Procedure: RELEASE TRIGGER FINGER LEFT THUMB;  Surgeon: Leandrew Koyanagi, MD;  Location: [facility];  Service: Orthopedics;  Laterality: Left;  . TRIGGER FINGER RELEASE Right 02/23/2018   Procedure: RELEASE RIGHT TRIGGER THUMB;  Surgeon: Leandrew Koyanagi, MD;  Location: [facility];  Service: Orthopedics;  Laterality: Right;  Bier block  . TUBAL LIGATION     Social History   Occupational History  . Not on file  Tobacco Use  . Smoking status: Current Every Day Smoker    Packs/day: 0.25    Types: Cigarettes  . Smokeless tobacco: Never Used  . Tobacco comment: does not use e cigarettes anymore  Substance and Sexual Activity  . Alcohol use: Yes    Alcohol/week: 0.0 standard drinks    Comment: rarely  . Drug use: No  . Sexual activity: Yes    Birth control/protection: Surgical

## 2018-08-12 LAB — HEMOGLOBIN A1C
EAG (MMOL/L): 7.6 (calc)
Hgb A1c MFr Bld: 6.4 % of total Hgb — ABNORMAL HIGH (ref <5.7)
MEAN PLASMA GLUCOSE: 137 (calc)

## 2018-08-12 LAB — TIQ-NTM

## 2018-08-12 NOTE — Progress Notes (Signed)
A1c is 6.4.  Ok to proceed with TKA

## 2018-08-29 ENCOUNTER — Other Ambulatory Visit: Payer: Self-pay | Admitting: Family Medicine

## 2018-09-01 NOTE — Progress Notes (Addendum)
Called patient x 3 and left messages, no answer no return call.  Called and spoke with Sherrie B. At Dr. Phoebe Sharps office and informed her the patient did not come to her preadmission appointment. Sherrie verbalized understanding and will inform Dr. Erlinda Hong

## 2018-09-01 NOTE — Pre-Procedure Instructions (Signed)
TENLEY WINWARD  09/01/2018      [facility] [place] [facility] [place] [address] [place]) [place] [address] Phone: [phone] Fax: [phone]    Your procedure is scheduled on September 12, 2018.  Report to [facility] Admitting at [phone] AM.  Call this number if you have problems the morning of surgery:  [phone]   Remember:  Do not eat or drink after midnight.   Take these medicines the morning of surgery with A SIP OF WATER  Cyclobenzaprine (flexeril) Omeprazole (prilosec) Eye drops-if needed  Follow your surgeon's instructions on when to hold/resume aspirin.  If no instructions were given call the office to determine how they would like to you take aspirin  7 days prior to surgery STOP taking any Aspirin Aleve, Naproxen, Ibuprofen, Motrin, Advil, Goody's, BC's, all herbal medications, fish oil, and all vitamins    WHAT DO I DO ABOUT MY DIABETES MEDICATION?  Marland Kitchen Do not take oral diabetes medicines (pills) the morning of surgery-metformin (glucophage).  Reviewed and Endorsed by [facility] Patient Education Committee, August 2015    How to Manage Your Diabetes Before and After Surgery  Why is it important to control my blood sugar before and after surgery? . Improving blood sugar levels before and after surgery helps healing and can limit problems. . A way of improving blood sugar control is eating a healthy diet by: o  Eating less sugar and carbohydrates o  Increasing activity/exercise o  Talking with your doctor about reaching your blood sugar goals . High blood sugars (greater than 180 mg/dL) can raise your risk of infections and slow your recovery, so you will need to focus on controlling your diabetes during the weeks before surgery. . Make sure that the doctor who takes care of your diabetes knows about your planned surgery including the date and location.  How do I manage my blood sugar before  surgery? . Check your blood sugar at least 4 times a day, starting 2 days before surgery, to make sure that the level is not too high or low. o Check your blood sugar the morning of your surgery when you wake up and every 2 hours until you get to the Short Stay unit. . If your blood sugar is less than 70 mg/dL, you will need to treat for low blood sugar: o Do not take insulin. o Treat a low blood sugar (less than 70 mg/dL) with  cup of clear juice (cranberry or apple), 4 glucose tablets, OR glucose gel. Recheck blood sugar in 15 minutes after treatment (to make sure it is greater than 70 mg/dL). If your blood sugar is not greater than 70 mg/dL on recheck, call [phone] o  for further instructions. . Report your blood sugar to the short stay nurse when you get to Short Stay.  . If you are admitted to the hospital after surgery: o Your blood sugar will be checked by the staff and you will probably be given insulin after surgery (instead of oral diabetes medicines) to make sure you have good blood sugar levels. o The goal for blood sugar control after surgery is 80-180 mg/dL.   [facility]- Preparing For Surgery  Before surgery, you can play an important role. Because skin is not sterile, your skin needs to be as free of germs as possible. You can reduce the number of germs on your skin by washing with CHG (chlorahexidine gluconate) Soap  before surgery.  CHG is an antiseptic cleaner which kills germs and bonds with the skin to continue killing germs even after washing.    Oral Hygiene is also important to reduce your risk of infection.  Remember - BRUSH YOUR TEETH THE MORNING OF SURGERY WITH YOUR REGULAR TOOTHPASTE  Please do not use if you have an allergy to CHG or antibacterial soaps. If your skin becomes reddened/irritated stop using the CHG.  Do not shave (including legs and underarms) for at least 48 hours prior to first CHG shower. It is OK to shave your face.  Please follow these  instructions carefully.   1. Shower the NIGHT BEFORE SURGERY and the MORNING OF SURGERY with CHG.   2. If you chose to wash your hair, wash your hair first as usual with your normal shampoo.  3. After you shampoo, rinse your hair and body thoroughly to remove the shampoo.  4. Use CHG as you would any other liquid soap. You can apply CHG directly to the skin and wash gently with a scrungie or a clean washcloth.   5. Apply the CHG Soap to your body ONLY FROM THE NECK DOWN.  Do not use on open wounds or open sores. Avoid contact with your eyes, ears, mouth and genitals (private parts). Wash Face and genitals (private parts)  with your normal soap.  6. Wash thoroughly, paying special attention to the area where your surgery will be performed.  7. Thoroughly rinse your body with warm water from the neck down.  8. DO NOT shower/wash with your normal soap after using and rinsing off the CHG Soap.  9. Pat yourself dry with a CLEAN TOWEL.  10. Wear CLEAN PAJAMAS to bed the night before surgery, wear comfortable clothes the morning of surgery  11. Place CLEAN SHEETS on your bed the night of your first shower and DO NOT SLEEP WITH PETS.  Day of Surgery:  Do not apply any deodorants/lotions.  Please wear clean clothes to the hospital/surgery center.   Remember to brush your teeth WITH YOUR REGULAR TOOTHPASTE.   Do not wear jewelry, make-up or nail polish.  Do not wear lotions, powders, or perfumes, or deodorant.  Do not shave 48 hours prior to surgery.   Do not bring valuables to the hospital.  [facility] is not responsible for any belongings or valuables.  Contacts, dentures or bridgework may not be worn into surgery.  Leave your suitcase in the car.  After surgery it may be brought to your room.  For patients admitted to the hospital, discharge time will be determined by your treatment team.  Patients discharged the day of surgery will not be allowed to drive home.   Please read over  the following fact sheets that you were given.

## 2018-09-02 ENCOUNTER — Emergency Department (HOSPITAL_COMMUNITY)
Admission: EM | Admit: 2018-09-02 | Discharge: 2018-09-02 | Disposition: A | Discharge status: Home / Self Care | Payer: Medicaid Other | Attending: Emergency Medicine | Admitting: Emergency Medicine

## 2018-09-02 ENCOUNTER — Other Ambulatory Visit: Payer: Self-pay | Admitting: *Deleted

## 2018-09-02 ENCOUNTER — Other Ambulatory Visit: Payer: Self-pay

## 2018-09-02 ENCOUNTER — Inpatient Hospital Stay (HOSPITAL_COMMUNITY)
Admission: RE | Admit: 2018-09-02 | Discharge: 2018-09-02 | Disposition: A | Discharge status: Home / Self Care | Payer: No Typology Code available for payment source | Source: Ambulatory Visit

## 2018-09-02 ENCOUNTER — Encounter (HOSPITAL_COMMUNITY): Payer: Self-pay | Admitting: *Deleted

## 2018-09-02 ENCOUNTER — Emergency Department (HOSPITAL_COMMUNITY): Payer: Medicaid Other

## 2018-09-02 DIAGNOSIS — F17210 Nicotine dependence, cigarettes, uncomplicated: Secondary | ICD-10-CM | POA: Insufficient documentation

## 2018-09-02 DIAGNOSIS — Y999 Unspecified external cause status: Secondary | ICD-10-CM | POA: Insufficient documentation

## 2018-09-02 DIAGNOSIS — I10 Essential (primary) hypertension: Secondary | ICD-10-CM | POA: Diagnosis not present

## 2018-09-02 DIAGNOSIS — Z79899 Other long term (current) drug therapy: Secondary | ICD-10-CM | POA: Diagnosis not present

## 2018-09-02 DIAGNOSIS — Z7984 Long term (current) use of oral hypoglycemic drugs: Secondary | ICD-10-CM | POA: Diagnosis not present

## 2018-09-02 DIAGNOSIS — W1842XA Slipping, tripping and stumbling without falling due to stepping into hole or opening, initial encounter: Secondary | ICD-10-CM | POA: Diagnosis not present

## 2018-09-02 DIAGNOSIS — Y9301 Activity, walking, marching and hiking: Secondary | ICD-10-CM | POA: Insufficient documentation

## 2018-09-02 DIAGNOSIS — Z7982 Long term (current) use of aspirin: Secondary | ICD-10-CM | POA: Diagnosis not present

## 2018-09-02 DIAGNOSIS — S93401A Sprain of unspecified ligament of right ankle, initial encounter: Secondary | ICD-10-CM | POA: Insufficient documentation

## 2018-09-02 DIAGNOSIS — E119 Type 2 diabetes mellitus without complications: Secondary | ICD-10-CM | POA: Insufficient documentation

## 2018-09-02 DIAGNOSIS — Y929 Unspecified place or not applicable: Secondary | ICD-10-CM | POA: Diagnosis not present

## 2018-09-02 DIAGNOSIS — S99911A Unspecified injury of right ankle, initial encounter: Secondary | ICD-10-CM | POA: Diagnosis present

## 2018-09-02 MED ORDER — IBUPROFEN 800 MG PO TABS: 800.0000 mg | ORAL_TABLET | Freq: Three times a day (TID) | ORAL | 0 refills | Status: DC | PRN

## 2018-09-02 NOTE — ED Notes (Signed)
ED Provider at bedside. 

## 2018-09-02 NOTE — ED Notes (Signed)
Bed: [facility] Expected date:  Expected time:  Means of arrival:  Comments: 

## 2018-09-02 NOTE — Discharge Instructions (Signed)
It was my pleasure taking care of you today!   Ibuprofen as needed for pain for the next 48 hours. Ice and alleviate for additional pain relief. If your symptoms persist without improvement in one week, please follow up with your orthopedic doctor.    TREATMENT  Rest, ice, elevation, and compression are the basic modes of treatment.    HOME CARE INSTRUCTIONS  Apply ice to the sore area for 15 to 20 minutes, 3 to 4 times per day. Do this while you are awake for the first 2 days. This can be stopped when the swelling goes away. Put the ice in a plastic bag and place a towel between the bag of ice and your skin.  Keep your leg elevated when possible to lessen swelling.  You may take off your ankle stabilizer at night and to take a shower or bath. Wiggle your toes several times per day if you are able.  ACTIVITY:            - Weight bearing as tolerated - if you can do it, do it. If it hurts, stop.             - Exercises should be limited to pain free range of motion            - Can start mobilization by tracing the alphabet with your foot in the air.       SEEK MEDICAL CARE IF:  You have an increase in bruising, swelling, or pain.  Your toes feel cold.  Pain relief is not achieved with medications.  EMERGENCY:: Your toes are numb or blue or you have severe pain.   MAKE SURE YOU:  Understand these instructions.  Will watch your condition.  Will get help right away if you are not doing well or get worse

## 2018-09-02 NOTE — ED Provider Notes (Signed)
[facility] DEPT Provider Note   CSN: [id number] Arrival date & time: 09/02/18  1020     History   Chief Complaint Chief Complaint  Patient presents with  . Fall  . Ankle Pain    right    HPI Melanie Phillips is a 52 y.o. female.  The history is provided by the patient and medical records. No language interpreter was used.  Fall   Ankle Pain    Melanie Phillips is a 52 y.o. female  with a PMH as listed below who presents to the Emergency Department complaining of acute onset of right ankle pain which occurred after injury this morning.  Patient states that she was walking when she stepped into a hole, twisting her right ankle.  Endorses pain and swelling to the right lateral ankle.  No numbness or tingling.  No medications taken prior to arrival for symptoms.  She is followed by orthopedics and was actually on her way to a preop appointment for a total right knee replacement that should be occurring next week.    Past Medical History:  Diagnosis Date  . Anxiety   . Arthritis   . Back pain   . Depression   . Diabetes mellitus without complication ([place])   . GERD (gastroesophageal reflux disease)   . Hyperlipidemia   . Hypertension     Patient Active Problem List   Diagnosis Date Noted  . Unilateral primary osteoarthritis, right knee 08/11/2018  . Trigger finger of right thumb 02/18/2018  . Right leg pain 08/16/2017  . Tenosynovitis of finger 05/26/2017  . Trigger thumb, left thumb 05/18/2017  . Arthritis of carpometacarpal [facility]) joint of right thumb 03/10/2017  . Essential hypertension 03/04/2017  . Healthcare maintenance 03/04/2017  . Depression 03/09/2016  . Carpal tunnel syndrome 03/09/2016  . Diabetes mellitus, type 2 ([place]) 08/15/2014  . Poor dental hygiene 08/15/2014  . Sciatica 05/31/2012  . OBESITY, UNSPECIFIED 03/07/2010  . TOBACCO ABUSE 03/07/2010  . PAIN IN JOINT, MULTIPLE SITES 03/07/2010    Past Surgical History:    Procedure Laterality Date  . CARPAL TUNNEL RELEASE Right 03/10/2017   Procedure: CARPAL TUNNEL RELEASE;  Surgeon: Leandrew Koyanagi, MD;  Location: [facility];  Service: Orthopedics;  Laterality: Right;  . CARPOMETACARPEL SUSPENSION PLASTY Right 03/10/2017   Procedure: Right thumb Ligament Reconstruction Tendon Interposition, Right Carpal tunnel release;  Surgeon: Leandrew Koyanagi, MD;  Location: [facility];  Service: Orthopedics;  Laterality: Right;  . TRIGGER FINGER RELEASE Left 05/26/2017   Procedure: RELEASE TRIGGER FINGER LEFT THUMB;  Surgeon: Leandrew Koyanagi, MD;  Location: [facility];  Service: Orthopedics;  Laterality: Left;  . TRIGGER FINGER RELEASE Right 02/23/2018   Procedure: RELEASE RIGHT TRIGGER THUMB;  Surgeon: Leandrew Koyanagi, MD;  Location: [facility];  Service: Orthopedics;  Laterality: Right;  Bier block  . TUBAL LIGATION       OB History    Gravida  6   Para      Term      Preterm      AB      Living  6     SAB      TAB      Ectopic      Multiple      Live Births               Home Medications    Prior to Admission medications   Medication  Sig Start Date End Date Taking? Authorizing Provider  ACCU-CHEK FASTCLIX LANCETS MISC Use as needed to check blood sugar. 03/30/18   Steve Rattler, DO  aspirin EC 81 MG tablet Take 1 tablet (81 mg total) by mouth daily. Patient taking differently: Take 81 mg by mouth every evening.  08/15/14   Katheren Shams, DO  cyclobenzaprine (FLEXERIL) 10 MG tablet TAKE 1 TABLET BY MOUTH THREE TIMES DAILY AS NEEDED FOR MUSCLE SPASMS Patient taking differently: Take 10 mg by mouth 3 (three) times daily as needed for muscle spasms.  06/29/18   Steve Rattler, DO  DULoxetine (CYMBALTA) 30 MG capsule TAKE 1 CAPSULE BY MOUTH ONCE DAILY Patient taking differently: Take 30 mg by mouth every evening.  06/29/18   Steve Rattler, DO  glucose blood (ACCU-CHEK SMARTVIEW) test strip  Use as instructed 03/30/18   Steve Rattler, DO  ibuprofen (ADVIL,MOTRIN) 200 MG tablet Take 400 mg by mouth every 8 (eight) hours as needed (for pain.).    [provider]  lisinopril (PRINIVIL,ZESTRIL) 5 MG tablet TAKE 1 TABLET BY MOUTH ONCE DAILY Patient taking differently: Take 5 mg by mouth daily.  04/26/18   Steve Rattler, DO  metFORMIN (GLUCOPHAGE) 1000 MG tablet TAKE 1 TABLET BY MOUTH TWICE DAILY WITH A MEAL Patient taking differently: Take 1,000 mg by mouth 2 (two) times daily.  12/13/17   Steve Rattler, DO  mupirocin ointment (BACTROBAN) 2 % Apply 1 application topically 2 (two) times daily. Patient not taking: Reported on 08/31/2018 03/14/18   Leandrew Koyanagi, MD  omeprazole (PRILOSEC) 40 MG capsule Take 1 capsule (40 mg total) by mouth daily. 06/29/18   Steve Rattler, DO  Polyethyl Glycol-Propyl Glycol (LUBRICANT EYE DROPS) 0.4-0.3 % SOLN Place 1 drop into both eyes 3 (three) times daily as needed (for irritated eyes.).     [provider]  senna-docusate (SENOKOT S) 8.6-50 MG tablet Take 1 tablet by mouth at bedtime as needed. Patient not taking: Reported on 08/31/2018 02/23/18   Leandrew Koyanagi, MD    Family History Family History  Problem Relation Age of Onset  . Diabetes Mother   . Diabetes Sister     Social History Social History   Tobacco Use  . Smoking status: Current Every Day Smoker    Packs/day: 0.50    Types: Cigarettes  . Smokeless tobacco: Never Used  . Tobacco comment: does not use e cigarettes anymore  Substance Use Topics  . Alcohol use: Yes    Alcohol/week: 0.0 standard drinks    Comment: rarely  . Drug use: No     Allergies   Vicodin [hydrocodone-acetaminophen]   Review of Systems Review of Systems  Musculoskeletal: Positive for arthralgias and myalgias.  All other systems reviewed and are negative.    Physical Exam Updated Vital Signs BP (!) 137/101 (BP Location: Right Arm)   Pulse 96   Temp 98 F (36.7 C) (Oral)    Resp 20   LMP 06/23/2013   SpO2 99%   Physical Exam  Constitutional: She appears well-developed and well-nourished. No distress.  HENT:  Head: Normocephalic and atraumatic.  Neck: Neck supple.  Cardiovascular: Normal rate, regular rhythm and normal heart sounds.  No murmur heard. Pulmonary/Chest: Effort normal and breath sounds normal. No respiratory distress. She has no wheezes. She has no rales.  Musculoskeletal:  Right ankle with tenderness to palpation around lateral malleolus. + mild swelling.  Full ROM. No erythema, ecchymosis, or deformity appreciated.  No break in skin. No pain to fifth metatarsal area or navicular region. Achilles intact. Good pedal pulse and cap refill of toes.Sensation grossly intact.  Neurological: She is alert.  Skin: Skin is warm and dry.  Nursing note and vitals reviewed.    ED Treatments / Results  Labs (all labs ordered are listed, but only abnormal results are displayed) Labs Reviewed - No data to display  EKG None  Radiology Dg Ankle Complete Right  Result Date: 09/02/2018 CLINICAL DATA:  fell in hole, twisted right ankle. EXAM: RIGHT ANKLE - COMPLETE 3+ VIEW COMPARISON:  06/03/2009 FINDINGS: Mild diffuse soft tissue swelling. No fracture, subluxation or dislocation. Small plantar calcaneal spur. Joint spaces maintained. IMPRESSION: No acute bony abnormality. Electronically Signed   By: Rolm Baptise M.D.   On: 09/02/2018 10:52    Procedures Procedures (including critical care time)  Medications Ordered in ED Medications - No data to display   Initial Impression / Assessment and Plan / ED Course  I have reviewed the triage vital signs and the nursing notes.  Pertinent labs & imaging results that were available during my care of the patient were reviewed by me and considered in my medical decision making (see chart for details).    Melanie Phillips is a 52 y.o. female who presents to ED for acute onset of right ankle pain after twisting  her ankle just prior to arrival.  Neurovascularly intact on exam.  X-ray negative with no acute findings.  Placed in ASO brace and crutches provided.  She has an orthopedist whom she can follow-up with if symptoms persist.  She is actually scheduled for a total knee replacement next week.  Short course of NSAIDs for the next 48 hours for pain.  All questions answered.   Final Clinical Impressions(s) / ED Diagnoses   Final diagnoses:  Sprain of right ankle, unspecified ligament, initial encounter    ED Discharge Orders    None       Enza Shone, [name], PA-C 09/02/18 1136    Malvin Johns, MD 09/02/18 [phone]

## 2018-09-02 NOTE — ED Triage Notes (Signed)
Pt states she was on her way to her pre-surgery appointment when she fell into a hole and twisted right her ankle.  Pt denies hitting head or passing out. Pt is supposed to have a total right knee replacement next week. Pt a/o x 4 in triage.

## 2018-09-07 ENCOUNTER — Encounter (HOSPITAL_COMMUNITY)
Admission: RE | Admit: 2018-09-07 | Discharge: 2018-09-07 | Disposition: A | Discharge status: Home / Self Care | Payer: Medicaid Other | Source: Ambulatory Visit | Attending: Physician Assistant | Admitting: Physician Assistant

## 2018-09-07 ENCOUNTER — Other Ambulatory Visit: Payer: Self-pay

## 2018-09-07 ENCOUNTER — Encounter (HOSPITAL_COMMUNITY)
Admission: RE | Admit: 2018-09-07 | Discharge: 2018-09-07 | Disposition: A | Discharge status: Home / Self Care | Payer: Medicaid Other | Source: Ambulatory Visit | Attending: Orthopaedic Surgery | Admitting: Orthopaedic Surgery

## 2018-09-07 ENCOUNTER — Encounter (HOSPITAL_COMMUNITY): Payer: Self-pay

## 2018-09-07 DIAGNOSIS — M1711 Unilateral primary osteoarthritis, right knee: Secondary | ICD-10-CM | POA: Diagnosis not present

## 2018-09-07 DIAGNOSIS — Z01818 Encounter for other preprocedural examination: Secondary | ICD-10-CM | POA: Insufficient documentation

## 2018-09-07 LAB — COMPREHENSIVE METABOLIC PANEL
ALT: 12 U/L (ref 0–44)
AST: 16 U/L (ref 15–41)
Albumin: 3.6 g/dL (ref 3.5–5.0)
Alkaline Phosphatase: 95 U/L (ref 38–126)
Anion gap: 11 (ref 5–15)
BUN: 11 mg/dL (ref 6–20)
CO2: 22 mmol/L (ref 22–32)
CREATININE: 0.76 mg/dL (ref 0.44–1.00)
Calcium: 9.5 mg/dL (ref 8.9–10.3)
Chloride: 107 mmol/L (ref 98–111)
GFR calc non Af Amer: >60 mL/min (ref >60)
GLUCOSE: 119 mg/dL — AB (ref 70–99)
Potassium: 4.3 mmol/L (ref 3.5–5.1)
Sodium: 140 mmol/L (ref 135–145)
Total Bilirubin: 0.4 mg/dL (ref 0.3–1.2)
Total Protein: 6.8 g/dL (ref 6.5–8.1)

## 2018-09-07 LAB — CBC WITH DIFFERENTIAL/PLATELET
Abs Immature Granulocytes: 0.02 10*3/uL (ref 0.00–0.07)
BASOS PCT: 0 %
Basophils Absolute: 0 10*3/uL (ref 0.0–0.1)
EOS ABS: 0.1 10*3/uL (ref 0.0–0.5)
Eosinophils Relative: 1 %
HCT: 47.7 % — ABNORMAL HIGH (ref 36.0–46.0)
Hemoglobin: 15.7 g/dL — ABNORMAL HIGH (ref 12.0–15.0)
Immature Granulocytes: 0 %
Lymphocytes Relative: 36 %
Lymphs Abs: 2.8 10*3/uL (ref 0.7–4.0)
MCH: 30.7 pg (ref 26.0–34.0)
MCHC: 32.9 g/dL (ref 30.0–36.0)
MCV: 93.3 fL (ref 80.0–100.0)
MONO ABS: 0.6 10*3/uL (ref 0.1–1.0)
MONOS PCT: 7 %
NEUTROS PCT: 56 %
Neutro Abs: 4.3 10*3/uL (ref 1.7–7.7)
PLATELETS: 432 10*3/uL — AB (ref 150–400)
RBC: 5.11 MIL/uL (ref 3.87–5.11)
RDW: 14.3 % (ref 11.5–15.5)
WBC: 7.8 10*3/uL (ref 4.0–10.5)
nRBC: 0 % (ref 0.0–0.2)

## 2018-09-07 LAB — HEMOGLOBIN A1C
HEMOGLOBIN A1C: 6.8 % — AB (ref 4.8–5.6)
MEAN PLASMA GLUCOSE: 148.46 mg/dL

## 2018-09-07 LAB — TYPE AND SCREEN
ABO/RH(D): AB POS
Antibody Screen: NEGATIVE

## 2018-09-07 LAB — SURGICAL PCR SCREEN
MRSA, PCR: NEGATIVE
STAPHYLOCOCCUS AUREUS: NEGATIVE

## 2018-09-07 LAB — PROTIME-INR
INR: 0.9
PROTHROMBIN TIME: 12 s (ref 11.4–15.2)

## 2018-09-07 LAB — GLUCOSE, CAPILLARY: GLUCOSE-CAPILLARY: 132 mg/dL — AB (ref 70–99)

## 2018-09-07 LAB — ABO/RH: ABO/RH(D): AB POS

## 2018-09-07 LAB — APTT: APTT: 33 s (ref 24–36)

## 2018-09-07 NOTE — Progress Notes (Signed)
PCP - Dr. Vanetta Shawl Cardiologist - patient denies  Chest x-ray - 09/07/2018 EKG - 09/07/2018 Stress Test - patient denies ECHO - patient denies Cardiac Cath - patient denies  Sleep Study - patient denies   Fasting Blood Sugar - patient unsure, she checks her CBG after she has eaten Checks Blood Sugar 2 times a day   Aspirin Instructions: patient instructed to contact surgeon's office for instructions  Anesthesia review: n/a  Patient denies shortness of breath, fever, cough and chest pain at PAT appointment   Patient verbalized understanding of instructions that were given to them at the PAT appointment. Patient was also instructed that they will need to review over the PAT instructions again at home before surgery.

## 2018-09-09 MED ORDER — TRANEXAMIC ACID 1000 MG/10ML IV SOLN
2000.0000 mg | INTRAVENOUS | Status: AC
  Administered 2018-09-12: 2000 mg via TOPICAL
  Filled 2018-09-09: qty 20

## 2018-09-09 MED ORDER — TRANEXAMIC ACID-NACL 1000-0.7 MG/100ML-% IV SOLN
1000.0000 mg | INTRAVENOUS | Status: AC
  Administered 2018-09-12: 1000 mg via INTRAVENOUS
  Filled 2018-09-09: qty 100

## 2018-09-12 ENCOUNTER — Encounter (HOSPITAL_COMMUNITY): Payer: Self-pay | Admitting: *Deleted

## 2018-09-12 ENCOUNTER — Inpatient Hospital Stay (HOSPITAL_COMMUNITY): Payer: Medicaid Other

## 2018-09-12 ENCOUNTER — Inpatient Hospital Stay (HOSPITAL_COMMUNITY): Payer: Medicaid Other | Admitting: Anesthesiology

## 2018-09-12 ENCOUNTER — Inpatient Hospital Stay (HOSPITAL_COMMUNITY)
Admission: RE | Admit: 2018-09-12 | Discharge: 2018-09-15 | DRG: 470 | Disposition: A | Discharge status: Home Health | Payer: Medicaid Other | Source: Ambulatory Visit | Attending: Orthopaedic Surgery | Admitting: Orthopaedic Surgery

## 2018-09-12 ENCOUNTER — Encounter (HOSPITAL_COMMUNITY)
Admission: RE | Disposition: A | Discharge status: Home Health | Payer: Self-pay | Source: Ambulatory Visit | Attending: Orthopaedic Surgery

## 2018-09-12 DIAGNOSIS — Z96659 Presence of unspecified artificial knee joint: Secondary | ICD-10-CM

## 2018-09-12 DIAGNOSIS — Z7984 Long term (current) use of oral hypoglycemic drugs: Secondary | ICD-10-CM

## 2018-09-12 DIAGNOSIS — E119 Type 2 diabetes mellitus without complications: Secondary | ICD-10-CM | POA: Diagnosis present

## 2018-09-12 DIAGNOSIS — E785 Hyperlipidemia, unspecified: Secondary | ICD-10-CM | POA: Diagnosis present

## 2018-09-12 DIAGNOSIS — Z79899 Other long term (current) drug therapy: Secondary | ICD-10-CM

## 2018-09-12 DIAGNOSIS — Z96651 Presence of right artificial knee joint: Secondary | ICD-10-CM

## 2018-09-12 DIAGNOSIS — K219 Gastro-esophageal reflux disease without esophagitis: Secondary | ICD-10-CM | POA: Diagnosis present

## 2018-09-12 DIAGNOSIS — I10 Essential (primary) hypertension: Secondary | ICD-10-CM | POA: Diagnosis present

## 2018-09-12 DIAGNOSIS — G8918 Other acute postprocedural pain: Secondary | ICD-10-CM | POA: Diagnosis not present

## 2018-09-12 DIAGNOSIS — M1711 Unilateral primary osteoarthritis, right knee: Secondary | ICD-10-CM | POA: Diagnosis not present

## 2018-09-12 DIAGNOSIS — Z7982 Long term (current) use of aspirin: Secondary | ICD-10-CM | POA: Diagnosis not present

## 2018-09-12 DIAGNOSIS — Z471 Aftercare following joint replacement surgery: Secondary | ICD-10-CM | POA: Diagnosis not present

## 2018-09-12 DIAGNOSIS — F17210 Nicotine dependence, cigarettes, uncomplicated: Secondary | ICD-10-CM | POA: Diagnosis present

## 2018-09-12 HISTORY — PX: TOTAL KNEE ARTHROPLASTY: SHX125

## 2018-09-12 LAB — GLUCOSE, CAPILLARY
GLUCOSE-CAPILLARY: 131 mg/dL — AB (ref 70–99)
Glucose-Capillary: 142 mg/dL — ABNORMAL HIGH (ref 70–99)
Glucose-Capillary: 139 mg/dL — ABNORMAL HIGH (ref 70–99)
Glucose-Capillary: 187 mg/dL — ABNORMAL HIGH (ref 70–99)

## 2018-09-12 SURGERY — ARTHROPLASTY, KNEE, TOTAL
Anesthesia: Spinal | Site: Knee | Laterality: Right

## 2018-09-12 MED ORDER — ASPIRIN 81 MG PO CHEW
81.0000 mg | CHEWABLE_TABLET | Freq: Two times a day (BID) | ORAL | Status: DC
  Administered 2018-09-12 – 2018-09-13 (×3): 81 mg via ORAL
  Filled 2018-09-12 (×3): qty 1

## 2018-09-12 MED ORDER — CELECOXIB 200 MG PO CAPS
200.0000 mg | ORAL_CAPSULE | Freq: Two times a day (BID) | ORAL | Status: DC
  Administered 2018-09-12 – 2018-09-13 (×4): 200 mg via ORAL
  Filled 2018-09-12 (×4): qty 1

## 2018-09-12 MED ORDER — CEFAZOLIN SODIUM-DEXTROSE 2-4 GM/100ML-% IV SOLN
2.0000 g | Freq: Four times a day (QID) | INTRAVENOUS | Status: AC
  Administered 2018-09-12 – 2018-09-13 (×3): 2 g via INTRAVENOUS
  Filled 2018-09-12 (×3): qty 100

## 2018-09-12 MED ORDER — CHLORHEXIDINE GLUCONATE 4 % EX LIQD: 60.0000 mL | Freq: Once | CUTANEOUS | Status: DC

## 2018-09-12 MED ORDER — SODIUM CHLORIDE 0.9 % IV SOLN
INTRAVENOUS | Status: DC
  Administered 2018-09-12: 16:00:00 via INTRAVENOUS

## 2018-09-12 MED ORDER — POLYETHYLENE GLYCOL 3350 17 G PO PACK
17.0000 g | PACK | Freq: Every day | ORAL | Status: DC | PRN
  Administered 2018-09-14: 17 g via ORAL
  Filled 2018-09-12: qty 1

## 2018-09-12 MED ORDER — MIDAZOLAM HCL 2 MG/2ML IJ SOLN
INTRAMUSCULAR | Status: AC
  Filled 2018-09-12: qty 2

## 2018-09-12 MED ORDER — HYDROMORPHONE HCL 1 MG/ML IJ SOLN
0.5000 mg | INTRAMUSCULAR | Status: DC | PRN
  Administered 2018-09-12 – 2018-09-13 (×2): 1 mg via INTRAVENOUS
  Filled 2018-09-12 (×2): qty 1

## 2018-09-12 MED ORDER — HYDROMORPHONE HCL 1 MG/ML IJ SOLN
INTRAMUSCULAR | Status: DC | PRN
  Administered 2018-09-12: 1 mg via INTRAVENOUS

## 2018-09-12 MED ORDER — ACETAMINOPHEN 325 MG PO TABS
325.0000 mg | ORAL_TABLET | Freq: Four times a day (QID) | ORAL | Status: DC | PRN
  Administered 2018-09-14 (×3): 650 mg via ORAL
  Filled 2018-09-12 (×3): qty 2

## 2018-09-12 MED ORDER — METHOCARBAMOL 750 MG PO TABS: 750.0000 mg | ORAL_TABLET | Freq: Two times a day (BID) | ORAL | 0 refills | Status: DC | PRN

## 2018-09-12 MED ORDER — ALUM & MAG HYDROXIDE-SIMETH 200-200-20 MG/5ML PO SUSP: 30.0000 mL | ORAL | Status: DC | PRN

## 2018-09-12 MED ORDER — ACETAMINOPHEN 500 MG PO TABS
1000.0000 mg | ORAL_TABLET | Freq: Four times a day (QID) | ORAL | Status: AC
  Administered 2018-09-12 – 2018-09-13 (×4): 1000 mg via ORAL
  Filled 2018-09-12 (×4): qty 2

## 2018-09-12 MED ORDER — FENTANYL CITRATE (PF) 100 MCG/2ML IJ SOLN
100.0000 ug | Freq: Once | INTRAMUSCULAR | Status: AC
  Administered 2018-09-12: 100 ug via INTRAVENOUS
  Administered 2018-09-12 (×2): 25 ug via INTRAVENOUS
  Administered 2018-09-12 (×5): 50 ug via INTRAVENOUS
  Administered 2018-09-12: 25 ug via INTRAVENOUS
  Administered 2018-09-12: 50 ug via INTRAVENOUS
  Administered 2018-09-12: 25 ug via INTRAVENOUS

## 2018-09-12 MED ORDER — DEXAMETHASONE SODIUM PHOSPHATE 10 MG/ML IJ SOLN
INTRAMUSCULAR | Status: DC | PRN
  Administered 2018-09-12: 5 mg via INTRAVENOUS

## 2018-09-12 MED ORDER — ONDANSETRON HCL 4 MG/2ML IJ SOLN
INTRAMUSCULAR | Status: AC
  Filled 2018-09-12: qty 2

## 2018-09-12 MED ORDER — FENTANYL CITRATE (PF) 100 MCG/2ML IJ SOLN
INTRAMUSCULAR | Status: AC
  Administered 2018-09-12: 100 ug
  Filled 2018-09-12: qty 2

## 2018-09-12 MED ORDER — LIDOCAINE HCL (CARDIAC) PF 100 MG/5ML IV SOSY
PREFILLED_SYRINGE | INTRAVENOUS | Status: DC | PRN
  Administered 2018-09-12: 30 mg via INTRAVENOUS
  Administered 2018-09-12: 20 mg via INTRAVENOUS

## 2018-09-12 MED ORDER — MAGNESIUM CITRATE PO SOLN: 1.0000 | Freq: Once | ORAL | Status: DC | PRN

## 2018-09-12 MED ORDER — SENNOSIDES-DOCUSATE SODIUM 8.6-50 MG PO TABS: 1.0000 | ORAL_TABLET | Freq: Every evening | ORAL | 1 refills | Status: DC | PRN

## 2018-09-12 MED ORDER — INSULIN ASPART 100 UNIT/ML ~~LOC~~ SOLN
0.0000 [IU] | Freq: Every day | SUBCUTANEOUS | Status: DC
  Administered 2018-09-13: 2 [IU] via SUBCUTANEOUS

## 2018-09-12 MED ORDER — HYDROMORPHONE HCL 1 MG/ML IJ SOLN
INTRAMUSCULAR | Status: AC
  Filled 2018-09-12: qty 1

## 2018-09-12 MED ORDER — OXYCODONE HCL 5 MG PO TABS: 5.0000 mg | ORAL_TABLET | ORAL | 0 refills | Status: DC | PRN

## 2018-09-12 MED ORDER — POLYETHYL GLYCOL-PROPYL GLYCOL 0.4-0.3 % OP SOLN: 1.0000 [drp] | Freq: Three times a day (TID) | OPHTHALMIC | Status: DC | PRN

## 2018-09-12 MED ORDER — POLYVINYL ALCOHOL 1.4 % OP SOLN
1.0000 [drp] | Freq: Three times a day (TID) | OPHTHALMIC | Status: DC | PRN
  Filled 2018-09-12: qty 15

## 2018-09-12 MED ORDER — METHOCARBAMOL 500 MG PO TABS
500.0000 mg | ORAL_TABLET | Freq: Four times a day (QID) | ORAL | Status: DC | PRN
  Administered 2018-09-13 – 2018-09-15 (×6): 500 mg via ORAL
  Filled 2018-09-12 (×6): qty 1

## 2018-09-12 MED ORDER — METOCLOPRAMIDE HCL 5 MG PO TABS: 5.0000 mg | ORAL_TABLET | Freq: Three times a day (TID) | ORAL | Status: DC | PRN

## 2018-09-12 MED ORDER — GABAPENTIN 300 MG PO CAPS
300.0000 mg | ORAL_CAPSULE | Freq: Three times a day (TID) | ORAL | Status: DC
  Administered 2018-09-12 – 2018-09-15 (×9): 300 mg via ORAL
  Filled 2018-09-12 (×9): qty 1

## 2018-09-12 MED ORDER — ASPIRIN EC 81 MG PO TBEC: 81.0000 mg | DELAYED_RELEASE_TABLET | Freq: Two times a day (BID) | ORAL | 0 refills | Status: DC

## 2018-09-12 MED ORDER — DEXAMETHASONE SODIUM PHOSPHATE 10 MG/ML IJ SOLN
INTRAMUSCULAR | Status: AC
  Filled 2018-09-12: qty 1

## 2018-09-12 MED ORDER — SODIUM CHLORIDE 0.9 % IR SOLN
Status: DC | PRN
  Administered 2018-09-12: 3000 mL

## 2018-09-12 MED ORDER — OXYCODONE HCL 5 MG PO TABS
5.0000 mg | ORAL_TABLET | ORAL | Status: DC | PRN
  Administered 2018-09-13 – 2018-09-14 (×3): 10 mg via ORAL
  Filled 2018-09-12 (×3): qty 2

## 2018-09-12 MED ORDER — PHENOL 1.4 % MT LIQD: 1.0000 | OROMUCOSAL | Status: DC | PRN

## 2018-09-12 MED ORDER — HYDROMORPHONE HCL 1 MG/ML IJ SOLN
INTRAMUSCULAR | Status: AC
  Filled 2018-09-12: qty 0.5

## 2018-09-12 MED ORDER — GLYCOPYRROLATE 0.2 MG/ML IJ SOLN
INTRAMUSCULAR | Status: DC | PRN
  Administered 2018-09-12: 0.2 mg via INTRAVENOUS

## 2018-09-12 MED ORDER — CEFAZOLIN SODIUM-DEXTROSE 2-4 GM/100ML-% IV SOLN
2.0000 g | INTRAVENOUS | Status: AC
  Administered 2018-09-12: 2 g via INTRAVENOUS

## 2018-09-12 MED ORDER — VANCOMYCIN HCL 1000 MG IV SOLR
INTRAVENOUS | Status: AC
  Filled 2018-09-12: qty 1000

## 2018-09-12 MED ORDER — MIDAZOLAM HCL 2 MG/2ML IJ SOLN
INTRAMUSCULAR | Status: AC
  Administered 2018-09-12: 2 mg
  Filled 2018-09-12: qty 2

## 2018-09-12 MED ORDER — OXYCODONE HCL 5 MG PO TABS
10.0000 mg | ORAL_TABLET | ORAL | Status: DC | PRN
  Administered 2018-09-12: 15 mg via ORAL
  Administered 2018-09-13: 10 mg via ORAL
  Administered 2018-09-14 (×2): 15 mg via ORAL
  Administered 2018-09-15: 10 mg via ORAL
  Administered 2018-09-15: 15 mg via ORAL
  Filled 2018-09-12 (×2): qty 3
  Filled 2018-09-12: qty 2
  Filled 2018-09-12: qty 3
  Filled 2018-09-12: qty 2
  Filled 2018-09-12: qty 3

## 2018-09-12 MED ORDER — PROPOFOL 10 MG/ML IV BOLUS
INTRAVENOUS | Status: DC | PRN
  Administered 2018-09-12: 185 mg via INTRAVENOUS

## 2018-09-12 MED ORDER — OXYCODONE HCL ER 10 MG PO T12A: 10.0000 mg | EXTENDED_RELEASE_TABLET | Freq: Two times a day (BID) | ORAL | 0 refills | Status: AC

## 2018-09-12 MED ORDER — SODIUM CHLORIDE 0.9% FLUSH
INTRAVENOUS | Status: DC | PRN
  Administered 2018-09-12 (×4): 10 mL

## 2018-09-12 MED ORDER — SORBITOL 70 % SOLN: 30.0000 mL | Freq: Every day | Status: DC | PRN

## 2018-09-12 MED ORDER — PROMETHAZINE HCL 25 MG PO TABS: 25.0000 mg | ORAL_TABLET | Freq: Four times a day (QID) | ORAL | 1 refills | Status: DC | PRN

## 2018-09-12 MED ORDER — CEFAZOLIN SODIUM-DEXTROSE 2-4 GM/100ML-% IV SOLN
INTRAVENOUS | Status: AC
  Filled 2018-09-12: qty 100

## 2018-09-12 MED ORDER — ONDANSETRON HCL 4 MG PO TABS: 4.0000 mg | ORAL_TABLET | Freq: Four times a day (QID) | ORAL | Status: DC | PRN

## 2018-09-12 MED ORDER — MENTHOL 3 MG MT LOZG: 1.0000 | LOZENGE | OROMUCOSAL | Status: DC | PRN

## 2018-09-12 MED ORDER — DEXAMETHASONE SODIUM PHOSPHATE 10 MG/ML IJ SOLN
10.0000 mg | Freq: Once | INTRAMUSCULAR | Status: AC
  Administered 2018-09-13: 10 mg via INTRAVENOUS
  Filled 2018-09-12: qty 1

## 2018-09-12 MED ORDER — GLYCOPYRROLATE PF 0.2 MG/ML IJ SOSY
PREFILLED_SYRINGE | INTRAMUSCULAR | Status: AC
  Filled 2018-09-12: qty 1

## 2018-09-12 MED ORDER — DIPHENHYDRAMINE HCL 12.5 MG/5ML PO ELIX: 25.0000 mg | ORAL_SOLUTION | ORAL | Status: DC | PRN

## 2018-09-12 MED ORDER — MIDAZOLAM HCL 2 MG/2ML IJ SOLN: 2.0000 mg | Freq: Once | INTRAMUSCULAR | Status: DC

## 2018-09-12 MED ORDER — WHITE PETROLATUM EX OINT
TOPICAL_OINTMENT | CUTANEOUS | Status: AC
  Administered 2018-09-12: 16:00:00
  Filled 2018-09-12: qty 28.35

## 2018-09-12 MED ORDER — 0.9 % SODIUM CHLORIDE (POUR BTL) OPTIME
TOPICAL | Status: DC | PRN
  Administered 2018-09-12: 1000 mL

## 2018-09-12 MED ORDER — DOCUSATE SODIUM 100 MG PO CAPS
100.0000 mg | ORAL_CAPSULE | Freq: Two times a day (BID) | ORAL | Status: DC
  Administered 2018-09-12 – 2018-09-15 (×6): 100 mg via ORAL
  Filled 2018-09-12 (×7): qty 1

## 2018-09-12 MED ORDER — ONDANSETRON HCL 4 MG/2ML IJ SOLN: 4.0000 mg | Freq: Four times a day (QID) | INTRAMUSCULAR | Status: DC | PRN

## 2018-09-12 MED ORDER — FENTANYL CITRATE (PF) 250 MCG/5ML IJ SOLN
INTRAMUSCULAR | Status: AC
  Filled 2018-09-12: qty 5

## 2018-09-12 MED ORDER — MIDAZOLAM HCL 5 MG/5ML IJ SOLN
INTRAMUSCULAR | Status: DC | PRN
  Administered 2018-09-12 (×2): 1 mg via INTRAVENOUS

## 2018-09-12 MED ORDER — ONDANSETRON HCL 4 MG/2ML IJ SOLN
INTRAMUSCULAR | Status: DC | PRN
  Administered 2018-09-12: 4 mg via INTRAVENOUS

## 2018-09-12 MED ORDER — METOCLOPRAMIDE HCL 5 MG/ML IJ SOLN: 5.0000 mg | Freq: Three times a day (TID) | INTRAMUSCULAR | Status: DC | PRN

## 2018-09-12 MED ORDER — LISINOPRIL 5 MG PO TABS
5.0000 mg | ORAL_TABLET | Freq: Every day | ORAL | Status: DC
  Administered 2018-09-12 – 2018-09-15 (×4): 5 mg via ORAL
  Filled 2018-09-12 (×4): qty 1

## 2018-09-12 MED ORDER — DULOXETINE HCL 30 MG PO CPEP
30.0000 mg | ORAL_CAPSULE | Freq: Every evening | ORAL | Status: DC
  Administered 2018-09-12 – 2018-09-14 (×3): 30 mg via ORAL
  Filled 2018-09-12 (×3): qty 1

## 2018-09-12 MED ORDER — INSULIN ASPART 100 UNIT/ML ~~LOC~~ SOLN
0.0000 [IU] | Freq: Three times a day (TID) | SUBCUTANEOUS | Status: DC
  Administered 2018-09-12: 3 [IU] via SUBCUTANEOUS
  Administered 2018-09-13 – 2018-09-14 (×3): 4 [IU] via SUBCUTANEOUS
  Administered 2018-09-15 (×2): 3 [IU] via SUBCUTANEOUS

## 2018-09-12 MED ORDER — OXYCODONE HCL ER 10 MG PO T12A
10.0000 mg | EXTENDED_RELEASE_TABLET | Freq: Two times a day (BID) | ORAL | Status: DC
  Administered 2018-09-12 – 2018-09-15 (×7): 10 mg via ORAL
  Filled 2018-09-12 (×7): qty 1

## 2018-09-12 MED ORDER — ONDANSETRON HCL 4 MG PO TABS: 4.0000 mg | ORAL_TABLET | Freq: Three times a day (TID) | ORAL | 0 refills | Status: DC | PRN

## 2018-09-12 MED ORDER — ATROPINE SULFATE 0.4 MG/ML IJ SOLN
INTRAMUSCULAR | Status: DC | PRN
  Administered 2018-09-12 (×2): 0.2 mg via INTRAVENOUS

## 2018-09-12 MED ORDER — ROPIVACAINE HCL 5 MG/ML IJ SOLN
INTRAMUSCULAR | Status: DC | PRN
  Administered 2018-09-12: 20 mL via PERINEURAL

## 2018-09-12 MED ORDER — LACTATED RINGERS IV SOLN
INTRAVENOUS | Status: DC
  Administered 2018-09-12: 08:00:00 via INTRAVENOUS

## 2018-09-12 MED ORDER — VANCOMYCIN HCL 1000 MG IV SOLR
INTRAVENOUS | Status: DC | PRN
  Administered 2018-09-12: 1 g via TOPICAL

## 2018-09-12 MED ORDER — PANTOPRAZOLE SODIUM 40 MG PO TBEC
40.0000 mg | DELAYED_RELEASE_TABLET | Freq: Every day | ORAL | Status: DC
  Administered 2018-09-12 – 2018-09-15 (×4): 40 mg via ORAL
  Filled 2018-09-12 (×4): qty 1

## 2018-09-12 MED ORDER — BUPIVACAINE LIPOSOME 1.3 % IJ SUSP
20.0000 mL | INTRAMUSCULAR | Status: AC
  Administered 2018-09-12: 20 mL
  Filled 2018-09-12: qty 20

## 2018-09-12 MED ORDER — METHOCARBAMOL 1000 MG/10ML IJ SOLN
500.0000 mg | Freq: Four times a day (QID) | INTRAVENOUS | Status: DC | PRN
  Filled 2018-09-12: qty 5

## 2018-09-12 MED ORDER — TRANEXAMIC ACID-NACL 1000-0.7 MG/100ML-% IV SOLN
1000.0000 mg | Freq: Once | INTRAVENOUS | Status: AC
  Administered 2018-09-12: 1000 mg via INTRAVENOUS
  Filled 2018-09-12: qty 100

## 2018-09-12 MED ORDER — LACTATED RINGERS IV SOLN
INTRAVENOUS | Status: DC
  Administered 2018-09-12 (×2): via INTRAVENOUS

## 2018-09-12 MED ORDER — HYDROMORPHONE HCL 1 MG/ML IJ SOLN
0.2500 mg | INTRAMUSCULAR | Status: DC | PRN
  Administered 2018-09-12 (×2): 0.5 mg via INTRAVENOUS

## 2018-09-12 MED ORDER — KETOROLAC TROMETHAMINE 15 MG/ML IJ SOLN
30.0000 mg | Freq: Four times a day (QID) | INTRAMUSCULAR | Status: AC
  Administered 2018-09-12 – 2018-09-13 (×4): 30 mg via INTRAVENOUS
  Filled 2018-09-12 (×4): qty 2

## 2018-09-12 SURGICAL SUPPLY — 80 items
ALCOHOL ISOPROPYL (RUBBING) (MISCELLANEOUS) ×3 IMPLANT
BAG DECANTER FOR FLEXI CONT (MISCELLANEOUS) ×3 IMPLANT
BANDAGE ESMARK 6X9 LF (GAUZE/BANDAGES/DRESSINGS) ×1 IMPLANT
BEARIN INSERT TIBIAL SZ5 9 (Orthopedic Implant) ×2 IMPLANT
BEARIN INSERT TIBIAL SZ5 9MM (Orthopedic Implant) ×1 IMPLANT
BEARING INSERT TIBIAL SZ5 9 (Orthopedic Implant) IMPLANT
BLADE SAW SGTL 13.0X1.19X90.0M (BLADE) ×3 IMPLANT
BNDG CMPR 9X6 STRL LF SNTH (GAUZE/BANDAGES/DRESSINGS) ×1
BNDG CMPR MED 10X6 ELC LF (GAUZE/BANDAGES/DRESSINGS) ×1
BNDG ELASTIC 6X10 VLCR STRL LF (GAUZE/BANDAGES/DRESSINGS) ×3 IMPLANT
BNDG ESMARK 6X9 LF (GAUZE/BANDAGES/DRESSINGS) ×3
BOWL SMART MIX CTS (DISPOSABLE) ×3 IMPLANT
BSPLAT TIB 5 KN TRITANIUM (Knees) ×1 IMPLANT
CLOSURE STERI-STRIP 1/2X4 (GAUZE/BANDAGES/DRESSINGS) ×1
CLSR STERI-STRIP ANTIMIC 1/2X4 (GAUZE/BANDAGES/DRESSINGS) ×3 IMPLANT
COVER SURGICAL LIGHT HANDLE (MISCELLANEOUS) ×3 IMPLANT
COVER WAND RF STERILE (DRAPES) ×3 IMPLANT
CUFF TOURNIQUET SINGLE 34IN LL (TOURNIQUET CUFF) ×3 IMPLANT
CUFF TOURNIQUET SINGLE 44IN (TOURNIQUET CUFF) IMPLANT
DRAPE EXTREMITY T 121X128X90 (DRAPE) ×3 IMPLANT
DRAPE HALF SHEET 40X57 (DRAPES) ×3 IMPLANT
DRAPE INCISE IOBAN 66X45 STRL (DRAPES) IMPLANT
DRAPE ORTHO SPLIT 77X108 STRL (DRAPES) ×6
DRAPE POUCH INSTRU U-SHP 10X18 (DRAPES) ×3 IMPLANT
DRAPE SURG ORHT 6 SPLT 77X108 (DRAPES) ×2 IMPLANT
DRAPE U-SHAPE 47X51 STRL (DRAPES) ×6 IMPLANT
DURAPREP 26ML APPLICATOR (WOUND CARE) ×6 IMPLANT
ELECT CAUTERY BLADE 6.4 (BLADE) ×3 IMPLANT
ELECT REM PT RETURN 9FT ADLT (ELECTROSURGICAL) ×3
ELECTRODE REM PT RTRN 9FT ADLT (ELECTROSURGICAL) ×1 IMPLANT
FEMORAL POSTERIOR SZ4 RT (Femur) IMPLANT
GAUZE SPONGE 4X4 12PLY STRL (GAUZE/BANDAGES/DRESSINGS) ×2 IMPLANT
GAUZE SPONGE 4X4 12PLY STRL LF (GAUZE/BANDAGES/DRESSINGS) ×3 IMPLANT
GLOVE BIOGEL PI IND STRL 7.0 (GLOVE) ×1 IMPLANT
GLOVE BIOGEL PI INDICATOR 7.0 (GLOVE) ×2
GLOVE ECLIPSE 7.0 STRL STRAW (GLOVE) ×9 IMPLANT
GLOVE SKINSENSE NS SZ7.5 (GLOVE) ×2
GLOVE SKINSENSE STRL SZ7.5 (GLOVE) ×1 IMPLANT
GLOVE SURG SYN 7.5  E (GLOVE) ×8
GLOVE SURG SYN 7.5 E (GLOVE) ×4 IMPLANT
GLOVE SURG SYN 7.5 PF PI (GLOVE) ×4 IMPLANT
GOWN STRL REIN XL XLG (GOWN DISPOSABLE) ×3 IMPLANT
GOWN STRL REUS W/ TWL LRG LVL3 (GOWN DISPOSABLE) ×1 IMPLANT
GOWN STRL REUS W/TWL LRG LVL3 (GOWN DISPOSABLE) ×3
HANDPIECE INTERPULSE COAX TIP (DISPOSABLE) ×3
HOOD PEEL AWAY FLYTE STAYCOOL (MISCELLANEOUS) ×6 IMPLANT
KIT BASIN OR (CUSTOM PROCEDURE TRAY) ×3 IMPLANT
KIT TURNOVER KIT B (KITS) ×3 IMPLANT
KNEE PATELLA ASYMMETRIC 9X29 (Knees) ×2 IMPLANT
KNEE TIBIAL COMPONENT SZ5 (Knees) ×2 IMPLANT
MANIFOLD NEPTUNE II (INSTRUMENTS) ×3 IMPLANT
MARKER SKIN DUAL TIP RULER LAB (MISCELLANEOUS) ×3 IMPLANT
NDL SPNL 18GX3.5 QUINCKE PK (NEEDLE) ×2 IMPLANT
NEEDLE SPNL 18GX3.5 QUINCKE PK (NEEDLE) ×6 IMPLANT
NS IRRIG 1000ML POUR BTL (IV SOLUTION) ×3 IMPLANT
PACK TOTAL JOINT (CUSTOM PROCEDURE TRAY) ×3 IMPLANT
PAD ABD 8X10 STRL (GAUZE/BANDAGES/DRESSINGS) ×4 IMPLANT
PAD ARMBOARD 7.5X6 YLW CONV (MISCELLANEOUS) ×6 IMPLANT
PAD CAST 4YDX4 CTTN HI CHSV (CAST SUPPLIES) ×2 IMPLANT
PADDING CAST COTTON 4X4 STRL (CAST SUPPLIES) ×6
PADDING CAST COTTON 6X4 STRL (CAST SUPPLIES) ×3 IMPLANT
POSTERIOR FEMORAL SZ4 RT (Femur) ×3 IMPLANT
SAW OSC TIP CART 19.5X105X1.3 (SAW) ×3 IMPLANT
SET HNDPC FAN SPRY TIP SCT (DISPOSABLE) ×1 IMPLANT
STAPLER VISISTAT 35W (STAPLE) IMPLANT
SUCTION FRAZIER HANDLE 10FR (MISCELLANEOUS) ×2
SUCTION TUBE FRAZIER 10FR DISP (MISCELLANEOUS) ×1 IMPLANT
SUT ETHILON 2 0 FS 18 (SUTURE) IMPLANT
SUT MNCRL AB 4-0 PS2 18 (SUTURE) IMPLANT
SUT VIC AB 0 CT1 27 (SUTURE) ×6
SUT VIC AB 0 CT1 27XBRD ANBCTR (SUTURE) ×2 IMPLANT
SUT VIC AB 1 CTX 27 (SUTURE) ×9 IMPLANT
SUT VIC AB 2-0 CT1 27 (SUTURE) ×9
SUT VIC AB 2-0 CT1 TAPERPNT 27 (SUTURE) ×3 IMPLANT
SYR 50ML LL SCALE MARK (SYRINGE) ×6 IMPLANT
TOWEL OR 17X24 6PK STRL BLUE (TOWEL DISPOSABLE) ×3 IMPLANT
TOWEL OR 17X26 10 PK STRL BLUE (TOWEL DISPOSABLE) ×3 IMPLANT
TRAY CATH 16FR W/PLASTIC CATH (SET/KITS/TRAYS/PACK) IMPLANT
UNDERPAD 30X30 (UNDERPADS AND DIAPERS) ×3 IMPLANT
WRAP KNEE MAXI GEL POST OP (GAUZE/BANDAGES/DRESSINGS) ×3 IMPLANT

## 2018-09-12 NOTE — H&P (Signed)
PREOPERATIVE H&P  Chief Complaint: right knee degenerative joint disease  HPI: Melanie Phillips is a 52 y.o. female who presents for surgical treatment of right knee degenerative joint disease.  She denies any changes in medical history.  Past Medical History:  Diagnosis Date  . Anxiety   . Arthritis   . Back pain   . Depression   . Diabetes mellitus without complication ([place])   . GERD (gastroesophageal reflux disease)   . Hyperlipidemia   . Hypertension    Past Surgical History:  Procedure Laterality Date  . CARPAL TUNNEL RELEASE Right 03/10/2017   Procedure: CARPAL TUNNEL RELEASE;  Surgeon: Leandrew Koyanagi, MD;  Location: [facility];  Service: Orthopedics;  Laterality: Right;  . CARPOMETACARPEL SUSPENSION PLASTY Right 03/10/2017   Procedure: Right thumb Ligament Reconstruction Tendon Interposition, Right Carpal tunnel release;  Surgeon: Leandrew Koyanagi, MD;  Location: [facility];  Service: Orthopedics;  Laterality: Right;  . TRIGGER FINGER RELEASE Left 05/26/2017   Procedure: RELEASE TRIGGER FINGER LEFT THUMB;  Surgeon: Leandrew Koyanagi, MD;  Location: [facility];  Service: Orthopedics;  Laterality: Left;  . TRIGGER FINGER RELEASE Right 02/23/2018   Procedure: RELEASE RIGHT TRIGGER THUMB;  Surgeon: Leandrew Koyanagi, MD;  Location: [facility];  Service: Orthopedics;  Laterality: Right;  Bier block  . TUBAL LIGATION     Social History   Socioeconomic History  . Marital status: Single    Spouse name: Not on file  . Number of children: Not on file  . Years of education: Not on file  . Highest education level: Not on file  Occupational History  . Not on file  Social Needs  . Financial resource strain: Not on file  . Food insecurity:    Worry: Not on file    Inability: Not on file  . Transportation needs:    Medical: Not on file    Non-medical: Not on file  Tobacco Use  . Smoking status: Current Every Day Smoker   Packs/day: 0.50    Types: Cigarettes  . Smokeless tobacco: Never Used  Substance and Sexual Activity  . Alcohol use: Yes    Alcohol/week: 0.0 standard drinks    Comment: rarely  . Drug use: No  . Sexual activity: Yes    Birth control/protection: Surgical  Lifestyle  . Physical activity:    Days per week: Not on file    Minutes per session: Not on file  . Stress: Not on file  Relationships  . Social connections:    Talks on phone: Not on file    Gets together: Not on file    Attends religious service: Not on file    Active member of club or organization: Not on file    Attends meetings of clubs or organizations: Not on file    Relationship status: Not on file  Other Topics Concern  . Not on file  Social History Narrative  . Not on file   Family History  Problem Relation Age of Onset  . Diabetes Mother   . Diabetes Sister    Allergies  Allergen Reactions  . Vicodin [Hydrocodone-Acetaminophen] Nausea And Vomiting   Prior to Admission medications   Medication Sig Start Date End Date Taking? Authorizing Provider  aspirin EC 81 MG tablet Take 1 tablet (81 mg total) by mouth daily. Patient taking differently: Take 81 mg by mouth every evening.  08/15/14  Yes Katheren Shams, DO  cyclobenzaprine (FLEXERIL) 10 MG tablet TAKE 1 TABLET BY MOUTH THREE TIMES DAILY AS NEEDED FOR MUSCLE SPASMS Patient taking differently: Take 10 mg by mouth 3 (three) times daily as needed for muscle spasms.  06/29/18  Yes Riccio, Angela C, DO  DULoxetine (CYMBALTA) 30 MG capsule TAKE 1 CAPSULE BY MOUTH ONCE DAILY Patient taking differently: Take 30 mg by mouth every evening.  06/29/18  Yes Riccio, Angela C, DO  lisinopril (PRINIVIL,ZESTRIL) 5 MG tablet TAKE 1 TABLET BY MOUTH ONCE DAILY Patient taking differently: Take 5 mg by mouth daily.  04/26/18  Yes Riccio, Angela C, DO  metFORMIN (GLUCOPHAGE) 1000 MG tablet TAKE 1 TABLET BY MOUTH TWICE DAILY WITH A MEAL Patient taking differently: Take 1,000 mg by  mouth 2 (two) times daily.  12/13/17  Yes Riccio, Gardiner Rhyme, DO  Polyethyl Glycol-Propyl Glycol (LUBRICANT EYE DROPS) 0.4-0.3 % SOLN Place 1 drop into both eyes 3 (three) times daily as needed (for irritated eyes.).    Yes [provider]  ACCU-CHEK FASTCLIX LANCETS MISC Use as needed to check blood sugar. 03/30/18   Steve Rattler, DO  glucose blood (ACCU-CHEK SMARTVIEW) test strip Use as instructed 03/30/18   Steve Rattler, DO  ibuprofen (ADVIL,MOTRIN) 800 MG tablet Take 1 tablet (800 mg total) by mouth every 8 (eight) hours as needed. 09/02/18   Ward, [name], PA-C  mupirocin ointment (BACTROBAN) 2 % Apply 1 application topically 2 (two) times daily. Patient not taking: Reported on 08/31/2018 03/14/18   Leandrew Koyanagi, MD  omeprazole (PRILOSEC) 40 MG capsule Take 1 capsule (40 mg total) by mouth daily. 06/29/18   Steve Rattler, DO  senna-docusate (SENOKOT S) 8.6-50 MG tablet Take 1 tablet by mouth at bedtime as needed. Patient not taking: Reported on 08/31/2018 02/23/18   Leandrew Koyanagi, MD     Positive ROS: All other systems have been reviewed and were otherwise negative with the exception of those mentioned in the HPI and as above.  Physical Exam: General: Alert, no acute distress Cardiovascular: No pedal edema Respiratory: No cyanosis, no use of accessory musculature GI: abdomen soft Skin: No lesions in the area of chief complaint Neurologic: Sensation intact distally Psychiatric: Patient is competent for consent with normal mood and affect Lymphatic: no lymphedema  MUSCULOSKELETAL: exam stable  Assessment: right knee degenerative joint disease  Plan: Plan for Procedure(s): RIGHT TOTAL KNEE ARTHROPLASTY  The risks benefits and alternatives were discussed with the patient including but not limited to the risks of nonoperative treatment, versus surgical intervention including infection, bleeding, nerve injury,  blood clots, cardiopulmonary complications, morbidity,  mortality, among others, and they were willing to proceed.   Preoperative templating of the joint replacement has been completed, documented, and submitted to the Operating Room personnel in order to optimize intra-operative equipment management.  Anticipated LOS equal to or greater than 2 midnights due to - Age 18 and older with one or more of the following:  - Obesity  - Expected need for hospital services (PT, OT, Nursing) required for safe  discharge  - Anticipated need for postoperative skilled nursing care or inpatient rehab  - Active co-morbidities: None  Eduard Roux, MD   09/12/2018 7:23 AM

## 2018-09-12 NOTE — Op Note (Signed)
Total Knee Arthroplasty Procedure Note  Preoperative diagnosis: Right knee osteoarthritis  Postoperative diagnosis:same  Operative procedure: Right total knee arthroplasty. CPT [phone]  Surgeon: N. Eduard Roux, MD  Assist: Madalyn Rob, PA-C; necessary for the timely completion of procedure and due to complexity of procedure.  Anesthesia: Spinal, regional  Tourniquet time: less than 60 mins  Implants used: [employer]ker Triatholon Uncemented Femur: PS 4 Tibia: 5 Patella: 29 mm Polyethylene: 9 mm  Indication: Melanie Phillips is a 52 y.o. year old female with a history of knee pain. Having failed conservative management, the patient elected to proceed with a total knee arthroplasty.  We have reviewed the risk and benefits of the surgery and they elected to proceed after voicing understanding.  Procedure:  After informed consent was obtained and understanding of the risk were voiced including but not limited to bleeding, infection, damage to surrounding structures including nerves and vessels, blood clots, leg length inequality and the failure to achieve desired results, the operative extremity was marked with verbal confirmation of the patient in the holding area.   The patient was then brought to the operating room and transported to the operating room table in the supine position.  A tourniquet was applied to the operative extremity around the upper thigh. The operative limb was then prepped and draped in the usual sterile fashion and preoperative antibiotics were administered.  A time out was performed prior to the start of surgery confirming the correct extremity, preoperative antibiotic administration, as well as team members, implants and instruments available for the case. Correct surgical site was also confirmed with preoperative radiographs. The limb was then elevated for exsanguination and the tourniquet was inflated. A midline incision was made and a standard medial  parapatellar approach was performed.  The patella was prepared and sized to a 29 mm.  A cover was placed on the patella for protection from retractors.  We then turned our attention to the femur. Posterior cruciate ligament was sacrificed. Start site was drilled in the femur and the intramedullary distal femoral cutting guide was placed, set at 5 degrees valgus, taking 8 mm of distal resection. The distal cut was made. Osteophytes were then removed. Next, the proximal tibial cutting guide was placed with appropriate slope, varus/valgus alignment and depth of resection. The proximal tibial cut was made. Gap blocks were then used to assess the extension gap and alignment, and appropriate soft tissue releases were performed. Attention was turned back to the femur, which was sized using the sizing guide to a size 4. Appropriate rotation of the femoral component was determined using epicondylar axis, Whiteside's line, and assessing the flexion gap under ligament tension. The appropriate size 4-in-1 cutting block was placed and cuts were made. Posterior femoral osteophytes and uncapped bone were then removed with the curved osteotome. The tibia was sized for a size 5 component. The femoral box-cutting guide was placed and prepared for a PS femoral component. Trial components were placed, and stability was checked in full extension, mid-flexion, and deep flexion. Proper tibial rotation was determined and marked.  The patella tracked well without a lateral release. Trial components were then removed and tibial preparation performed. A posterior capsular injection comprising of 20 cc of 1.3% exparel and 40 cc of normal saline was performed for postoperative pain control. The bony surfaces were irrigated with a pulse lavage and then dried. The final components sized above were malleted into place. The stability of the construct was re-evaluated throughout a range of  motion and found to be acceptable. The trial liner was  removed, the knee was copiously irrigated, and the knee was re-evaluated for any excess bone debris. The real polyethylene liner, 9 mm thick, was inserted and checked to ensure the locking mechanism had engaged appropriately. The tourniquet was deflated and hemostasis was achieved. The wound was irrigated with normal saline.  One gram of vancomycin powder was placed in the surgical bed.  A drain was not placed.  Capsular closure was performed with a #1 vicryl, subcutaneous fat closed with a 0 vicryl suture, then subcutaneous tissue closed with interrupted 2.0 vicryl suture. The skin was then closed with a 3.0 monocryl. A sterile dressing was applied.  The patient was awakened in the operating room and taken to recovery in stable condition. All sponge, needle, and instrument counts were correct at the end of the case.  Position: supine  Complications: none.  Time Out: performed   Drains/Packing: none  Estimated blood loss: minimal  Returned to Recovery Room: in good condition.   Antibiotics: yes   Mechanical VTE (DVT) Prophylaxis: sequential compression devices, TED thigh-high  Chemical VTE (DVT) Prophylaxis: aspirin  Fluid Replacement  Crystalloid: see anesthesia record Blood: none  FFP: none   Specimens Removed: 1 to pathology   Sponge and Instrument Count Correct? yes   PACU: portable radiograph - knee AP and Lateral   Admission: inpatient status  Plan/RTC: Return in 2 weeks for wound check.   Weight Bearing/Load Lower Extremity: full   N. Eduard Roux, MD [place] 11:59 AM

## 2018-09-12 NOTE — Anesthesia Preprocedure Evaluation (Addendum)
Anesthesia Evaluation  Patient identified by MRN, date of birth, ID band Patient awake    Reviewed: Allergy & Precautions, H&P , NPO status , Patient's Chart, lab work & pertinent test results  Airway Mallampati: II  TM Distance: >3 FB Neck ROM: Full    Dental no notable dental hx. (+) Teeth Intact, Dental Advisory Given   Pulmonary Current Smoker,    Pulmonary exam normal breath sounds clear to auscultation       Cardiovascular hypertension, Pt. on medications  Rhythm:Regular Rate:Normal     Neuro/Psych Anxiety Depression negative neurological ROS     GI/Hepatic Neg liver ROS, GERD  Medicated and Controlled,  Endo/Other  diabetes, Type 2, Oral Hypoglycemic AgentsMorbid obesity  Renal/GU negative Renal ROS  negative genitourinary   Musculoskeletal   Abdominal   Peds  Hematology negative hematology ROS (+)   Anesthesia Other Findings   Reproductive/Obstetrics negative OB ROS                            Anesthesia Physical Anesthesia Plan  ASA: III  Anesthesia Plan: Spinal   Post-op Pain Management:  Regional for Post-op pain   Induction: Intravenous  PONV Risk Score and Plan: 2 and Propofol infusion, Midazolam and Ondansetron  Airway Management Planned: Simple Face Mask  Additional Equipment:   Intra-op Plan:   Post-operative Plan:   Informed Consent: I have reviewed the patients History and Physical, chart, labs and discussed the procedure including the risks, benefits and alternatives for the proposed anesthesia with the patient or authorized representative who has indicated his/her understanding and acceptance.   Dental advisory given  Plan Discussed with: CRNA  Anesthesia Plan Comments:         Anesthesia Quick Evaluation

## 2018-09-12 NOTE — Anesthesia Postprocedure Evaluation (Signed)
Anesthesia Post Note  Patient: Melanie Phillips  Procedure(s) Performed: RIGHT TOTAL KNEE ARTHROPLASTY (Right Knee)     Patient location during evaluation: PACU Anesthesia Type: General and Regional Level of consciousness: awake and alert Pain management: pain level controlled Vital Signs Assessment: post-procedure vital signs reviewed and stable Respiratory status: spontaneous breathing, nonlabored ventilation, respiratory function stable and patient connected to nasal cannula oxygen Cardiovascular status: blood pressure returned to baseline and stable Postop Assessment: no apparent nausea or vomiting Anesthetic complications: no    Last Vitals:  Vitals:   09/12/18 1411 09/12/18 1426  BP: (!) 156/96 (!) 166/90  Pulse: 79 65  Resp: 12 12  Temp:  (!) 36.4 C  SpO2: 94% 100%    Last Pain:  Vitals:   09/12/18 1426  PainSc: 9                  Kendall Justo,W. EDMOND

## 2018-09-12 NOTE — Progress Notes (Signed)
Orthopedic Tech Progress Note Patient Details:  Melanie Phillips 31-Mar-1966 [id number]  CPM Right Knee CPM Right Knee: On Right Knee Flexion (Degrees): 90 Right Knee Extension (Degrees): 0 Additional Comments: foot roll  Post Interventions Patient Tolerated: Well  [name] 09/12/2018, 2:01 PM

## 2018-09-12 NOTE — Anesthesia Procedure Notes (Signed)
Anesthesia Regional Block: Adductor canal block   Pre-Anesthetic Checklist: ,, timeout performed, Correct Patient, Correct Site, Correct Laterality, Correct Procedure, Correct Position, site marked, Risks and benefits discussed, pre-op evaluation,  At surgeon's request and post-op pain management  Laterality: Right  Prep: Maximum Sterile Barrier Precautions used, chloraprep       Needles:  Injection technique: Single-shot  Needle Type: Echogenic Stimulator Needle     Needle Length: 9cm  Needle Gauge: 21     Additional Needles:   Procedures:,,,, ultrasound used (permanent image in chart),,,,  Narrative:  Start time: 09/12/2018 8:54 AM End time: 09/12/2018 9:04 AM Injection made incrementally with aspirations every 5 mL.  Performed by: Personally  Anesthesiologist: Roderic Palau, MD  Additional Notes: 2% Lidocaine skin wheel.

## 2018-09-12 NOTE — Transfer of Care (Signed)
Immediate Anesthesia Transfer of Care Note  Patient: Melanie Phillips  Procedure(s) Performed: RIGHT TOTAL KNEE ARTHROPLASTY (Right Knee)  Patient Location: PACU  Anesthesia Type:General  Level of Consciousness: awake, alert  and oriented  Airway & Oxygen Therapy: Patient Spontanous Breathing and Patient connected to nasal cannula oxygen  Post-op Assessment: Report given to RN and Post -op Vital signs reviewed and stable  Post vital signs: Reviewed and stable  Last Vitals:  Vitals Value Taken Time  BP 170/87 09/12/2018 12:41 PM  Temp    Pulse 81 09/12/2018 12:47 PM  Resp 15 09/12/2018 12:47 PM  SpO2 95 % 09/12/2018 12:47 PM  Vitals shown include unvalidated device data.  Last Pain:  Vitals:   09/12/18 0930  PainSc: 0-No pain         Complications: No apparent anesthesia complications

## 2018-09-12 NOTE — Anesthesia Procedure Notes (Signed)
Procedure Name: LMA Insertion Date/Time: 09/12/2018 10:45 AM Performed by: Garrel Ridgel, CRNA Pre-anesthesia Checklist: Patient identified, Emergency Drugs available, Suction available, Patient being monitored and Timeout performed Patient Re-evaluated:Patient Re-evaluated prior to induction Oxygen Delivery Method: Circle system utilized Preoxygenation: Pre-oxygenation with 100% oxygen Induction Type: IV induction Ventilation: Mask ventilation without difficulty LMA: LMA inserted LMA Size: 4.0 Number of attempts: 1 Placement Confirmation: positive ETCO2 Tube secured with: Tape Dental Injury: Teeth and Oropharynx as per pre-operative assessment

## 2018-09-12 NOTE — Progress Notes (Signed)
Patient transferred to 5N32 from PACU. Patient is awake and oriented. Vitals stable. Right leg in CPM machine. Dressing to right knee is clean, dry, and intact. Will continue to monitor.

## 2018-09-12 NOTE — Progress Notes (Signed)
Orthopedic Tech Progress Note Patient Details:  Melanie Phillips 1966-07-29 [id number]  CPM Right Knee CPM Right Knee: Off Right Knee Flexion (Degrees): 90 Right Knee Extension (Degrees): 0 Additional Comments: foot roll  Post Interventions Patient Tolerated: Well  [name] 09/12/2018, 4:31 PM

## 2018-09-12 NOTE — Anesthesia Procedure Notes (Signed)
Spinal  Patient location during procedure: OR Start time: 09/12/2018 10:24 AM End time: 09/12/2018 10:34 AM Staffing Anesthesiologist: Roderic Palau, MD Performed: anesthesiologist  Preanesthetic Checklist Completed: patient identified, surgical consent, pre-op evaluation, timeout performed, IV checked, risks and benefits discussed and monitors and equipment checked Spinal Block Patient position: sitting Prep: DuraPrep Patient monitoring: cardiac monitor, continuous pulse ox and blood pressure Approach: midline Location: L3-4 Injection technique: single-shot Needle Needle type: Quincke (Attempted Pencan 24G. Unable to locate space.)  Needle gauge: 22 G Needle length: 9 cm Assessment Sensory level: T8 Events: failed spinal Additional Notes Functioning IV was confirmed and monitors were applied. Sterile prep and drape, including hand hygiene and sterile gloves were used. The patient was positioned and the spine was prepped. The skin was anesthetized with lidocaine.  Free flow of clear CSF was obtained prior to injecting local anesthetic into the CSF.  The spinal needle did not aspirate freely following injection.  The needle was carefully withdrawn.  The patient tolerated the procedure well. The patient still had feeling following prep and drape. Has movement in lower extremeties. Failed spinal. Will proceed with GA.

## 2018-09-12 NOTE — Evaluation (Signed)
Physical Therapy Evaluation Patient Details Name: Melanie Phillips MRN: [id number] DOB: 1966/03/07 Today's Date: 09/12/2018   History of Present Illness  Pt is a 52 y/o female s/p R TKA. PMH includes anxiety, DM, HTN, depression.   Clinical Impression  Pt is s/p surgery above with deficits below. Pt very limited in mobility and exercise tolerance secondary to increased pain. Required min to mod A using RW to transfer to chair. Educated about knee precautions. Anticipate pt will progress well once pain controlled. Will continue to follow acutely to maximize functional mobility independence and safety.     Follow Up Recommendations Follow surgeon's recommendation for DC plan and follow-up therapies;Supervision for mobility/OOB    Equipment Recommendations  None recommended by PT    Recommendations for Other Services OT consult     Precautions / Restrictions Precautions Precautions: Knee Precaution Booklet Issued: Yes (comment) Precaution Comments: Reviewed knee precautions with pt.  Restrictions Weight Bearing Restrictions: Yes RLE Weight Bearing: Weight bearing as tolerated      Mobility  Bed Mobility Overal bed mobility: Needs Assistance Bed Mobility: Supine to Sit     Supine to sit: Supervision     General bed mobility comments: Supervision for safety. Increased time to perform.   Transfers Overall transfer level: Needs assistance Equipment used: Rolling walker (2 wheeled) Transfers: Sit to/from [facility] Sit to Stand: From elevated surface;Mod assist Stand pivot transfers: Min assist       General transfer comment: Mod A for lift assist and steadying. Increased time required to stand. Min A for steadying assist to transfer to chair. Further mobility deferred secondary to increased pain.   Ambulation/Gait                Stairs            Wheelchair Mobility    Modified Rankin (Stroke Patients Only)       Balance Overall  balance assessment: Needs assistance Sitting-balance support: No upper extremity supported;Feet supported Sitting balance-Leahy Scale: Good     Standing balance support: Bilateral upper extremity supported;During functional activity Standing balance-Leahy Scale: Poor Standing balance comment: Reliant on BUE support                              Pertinent Vitals/Pain Pain Assessment: 0-10 Pain Score: 10-Worst pain ever Pain Location: R knee  Pain Descriptors / Indicators: Grimacing;Operative site guarding;Sharp Pain Intervention(s): Monitored during session;Limited activity within patient's tolerance;Repositioned    Home Living Family/patient expects to be discharged to:: Private residence Living Arrangements: Spouse/significant other Available Help at Discharge: Family;Available 24 hours/day Type of Home: House Home Access: Stairs to enter Entrance Stairs-Rails: None Entrance Stairs-Number of Steps: 2 Home Layout: One level Home Equipment: Walker - 2 wheels;Bedside commode      Prior Function Level of Independence: Independent               Hand Dominance        Extremity/Trunk Assessment   Upper Extremity Assessment Upper Extremity Assessment: Defer to OT evaluation    Lower Extremity Assessment Lower Extremity Assessment: RLE deficits/detail RLE Deficits / Details: Reports some decreased sensation in groin area. Deficits consistent with post op pain and weakness.     Cervical / Trunk Assessment Cervical / Trunk Assessment: Normal  Communication   Communication: No difficulties  Cognition Arousal/Alertness: Awake/alert Behavior During Therapy: WFL for tasks assessed/performed Overall Cognitive Status: Within Functional Limits for tasks assessed  General Comments General comments (skin integrity, edema, etc.): Very limited tolerance for ther ex this session secondary to increased pain.      Exercises Total Joint Exercises Ankle Circles/Pumps: AROM;Right;10 reps   Assessment/Plan    PT Assessment Patient needs continued PT services  PT Problem List Decreased strength;Decreased range of motion;Decreased balance;Decreased activity tolerance;Decreased mobility;Decreased knowledge of use of DME;Decreased knowledge of precautions;Pain       PT Treatment Interventions DME instruction;Gait training;Stair training;Functional mobility training;Therapeutic activities;Balance training;Therapeutic exercise;Patient/family education    PT Goals (Current goals can be found in the Care Plan section)  Acute Rehab PT Goals Patient Stated Goal: to decrease pain  PT Goal Formulation: With patient Time For Goal Achievement: 09/26/18 Potential to Achieve Goals: Good    Frequency 7X/week   Barriers to discharge        Co-evaluation               AM-PAC PT "6 Clicks" Daily Activity  Outcome Measure Difficulty turning over in bed (including adjusting bedclothes, sheets and blankets)?: A Little Difficulty moving from lying on back to sitting on the side of the bed? : A Lot Difficulty sitting down on and standing up from a chair with arms (e.g., wheelchair, bedside commode, etc,.)?: Unable Help needed moving to and from a bed to chair (including a wheelchair)?: A Little Help needed walking in hospital room?: A Lot Help needed climbing 3-5 steps with a railing? : A Lot 6 Click Score: 13    End of Session Equipment Utilized During Treatment: Gait belt Activity Tolerance: Patient limited by pain Patient left: in chair;with call bell/phone within reach;with family/visitor present Nurse Communication: Mobility status PT Visit Diagnosis: Unsteadiness on feet (R26.81);Other abnormalities of gait and mobility (R26.89);Pain Pain - Right/Left: Right Pain - part of body: Knee    Time: 1730-1753 PT Time Calculation (min) (ACUTE ONLY): 23 min   Charges:   PT Evaluation $PT Eval  Low Complexity: 1 Low PT Treatments $Therapeutic Activity: 8-22 mins        Leighton Ruff, PT, DPT  Acute Rehabilitation Services  Pager: [phone] Office: [phone]   Rudean Hitt 09/12/2018, 6:21 PM

## 2018-09-12 NOTE — Discharge Instructions (Signed)

## 2018-09-13 ENCOUNTER — Other Ambulatory Visit: Payer: Self-pay

## 2018-09-13 ENCOUNTER — Encounter (HOSPITAL_COMMUNITY): Payer: Self-pay | Admitting: Orthopaedic Surgery

## 2018-09-13 ENCOUNTER — Telehealth (INDEPENDENT_AMBULATORY_CARE_PROVIDER_SITE_OTHER): Payer: Self-pay | Admitting: *Deleted

## 2018-09-13 LAB — GLUCOSE, CAPILLARY
GLUCOSE-CAPILLARY: 179 mg/dL — AB (ref 70–99)
GLUCOSE-CAPILLARY: 206 mg/dL — AB (ref 70–99)
Glucose-Capillary: 159 mg/dL — ABNORMAL HIGH (ref 70–99)
Glucose-Capillary: 182 mg/dL — ABNORMAL HIGH (ref 70–99)

## 2018-09-13 LAB — CBC
HCT: 40 % (ref 36.0–46.0)
HEMOGLOBIN: 12.8 g/dL (ref 12.0–15.0)
MCH: 29.6 pg (ref 26.0–34.0)
MCHC: 32 g/dL (ref 30.0–36.0)
MCV: 92.6 fL (ref 80.0–100.0)
Platelets: 397 10*3/uL (ref 150–400)
RBC: 4.32 MIL/uL (ref 3.87–5.11)
RDW: 13.8 % (ref 11.5–15.5)
WBC: 10.3 10*3/uL (ref 4.0–10.5)
nRBC: 0 % (ref 0.0–0.2)

## 2018-09-13 LAB — BASIC METABOLIC PANEL
Anion gap: 8 (ref 5–15)
BUN: 9 mg/dL (ref 6–20)
CHLORIDE: 105 mmol/L (ref 98–111)
CO2: 27 mmol/L (ref 22–32)
CREATININE: 0.92 mg/dL (ref 0.44–1.00)
Calcium: 9 mg/dL (ref 8.9–10.3)
GFR calc non Af Amer: >60 mL/min (ref >60)
GLUCOSE: 169 mg/dL — AB (ref 70–99)
Potassium: 4.2 mmol/L (ref 3.5–5.1)
Sodium: 140 mmol/L (ref 135–145)

## 2018-09-13 MED ORDER — INFLUENZA VAC SPLIT QUAD 0.5 ML IM SUSY: 0.5000 mL | PREFILLED_SYRINGE | INTRAMUSCULAR | Status: DC

## 2018-09-13 NOTE — Progress Notes (Signed)
Subjective: 1 Day Post-Op Procedure(s) (LRB): RIGHT TOTAL KNEE ARTHROPLASTY (Right) Patient reports pain as moderate.  A little lightheaded when sitting up, otherwise doing ok.  Objective: Vital signs in last 24 hours: Temp:  [97.5 F (36.4 C)-98.4 F (36.9 C)] 98.3 F (36.8 C) (10/15 0200) Pulse Rate:  [59-100] 59 (10/15 0200) Resp:  [8-21] 18 (10/14 2310) BP: (127-170)/(74-104) 129/89 (10/15 0200) SpO2:  [90 %-100 %] 100 % (10/15 0200)  Intake/Output from previous day: 10/14 0701 - 10/15 0700 In: 2992.7 [I.V.:2692.6; IV Piggyback:300.1] Out: 30 [Blood:30] Intake/Output this shift: No intake/output data recorded.  Recent Labs    09/13/18 0111  HGB 12.8   Recent Labs    09/13/18 0111  WBC 10.3  RBC 4.32  HCT 40.0  PLT 397   Recent Labs    09/13/18 0111  NA 140  K 4.2  CL 105  CO2 27  BUN 9  CREATININE 0.92  GLUCOSE 169*  CALCIUM 9.0   No results for input(s): LABPT, INR in the last 72 hours.  Neurologically intact Neurovascular intact Sensation intact distally Intact pulses distally Dorsiflexion/Plantar flexion intact Incision: moderate drainage No cellulitis present Compartment soft  Anticipated LOS equal to or greater than 2 midnights due to - Age 54 and older with one or more of the following:  - Obesity  - Expected need for hospital services (PT, OT, Nursing) required for safe  discharge  - Anticipated need for postoperative skilled nursing care or inpatient rehab  - Active co-morbidities: Diabetes OR   - Unanticipated findings during/Post Surgery: Slow post-op progression: GI, pain control, mobility  - Patient is a high risk of re-admission due to: None   Assessment/Plan: 1 Day Post-Op Procedure(s) (LRB): RIGHT TOTAL KNEE ARTHROPLASTY (Right) Advance diet Up with therapy D/C IV fluids Plan for discharge tomorrow home with hhpt WBAT RLE PLEASE APPLY TED HOSE TO RLE Dressing changed by me today    Melanie Phillips 09/13/2018, 7:09  AM

## 2018-09-13 NOTE — Telephone Encounter (Signed)
Received call from [place] on [facility] stating pt is having some bleeding at incision and wants to know what you would like for him to do as to whether reapply or d/c.   Please call [phone]

## 2018-09-13 NOTE — Telephone Encounter (Signed)
Called Rashanne back advised on message per Dr Erlinda Hong.

## 2018-09-13 NOTE — Evaluation (Signed)
Occupational Therapy Evaluation Patient Details Name: Melanie Phillips MRN: [id number] DOB: 05-Apr-1966 Today's Date: 09/13/2018    History of Present Illness Pt is a 52 y/o female s/p R TKA. PMH includes anxiety, DM, HTN, depression.    Clinical Impression   PTA, pt was living with her husband and was independent. Currently, pt requires Min Guard A for LB ADLs and functional mobility using RW. Provided education and handout on LB ADLs, toilet transfer, and tub transfer with 3N1; pt demonstrated understanding. Answered all pt questions. Recommend dc home once medically stable per physician. All acute OT needs met and will sign off. Thank you.     Follow Up Recommendations  Follow surgeon's recommendation for DC plan and follow-up therapies;Supervision/Assistance - 24 hour    Equipment Recommendations  3 in 1 bedside commode    Recommendations for Other Services PT consult     Precautions / Restrictions Precautions Precautions: Knee Precaution Booklet Issued: Yes (comment) Precaution Comments: Reviewed knee precautions with pt.  Restrictions Weight Bearing Restrictions: Yes RLE Weight Bearing: Weight bearing as tolerated      Mobility Bed Mobility Overal bed mobility: Needs Assistance Bed Mobility: Supine to Sit     Supine to sit: Supervision     General bed mobility comments: Supervision for safety. Increased time to perform.   Transfers Overall transfer level: Needs assistance Equipment used: Rolling walker (2 wheeled) Transfers: Sit to/from [facility] Sit to Stand: Min guard         General transfer comment: Min Guard A for safety    Balance Overall balance assessment: Needs assistance Sitting-balance support: No upper extremity supported;Feet supported Sitting balance-Leahy Scale: Good     Standing balance support: Bilateral upper extremity supported;During functional activity Standing balance-Leahy Scale: Poor Standing balance comment:  Reliant on BUE support                            ADL either performed or assessed with clinical judgement   ADL Overall ADL's : Needs assistance/impaired Eating/Feeding: Independent   Grooming: Supervision/safety;Set up;Standing   Upper Body Bathing: Supervision/ safety;Set up;Sitting   Lower Body Bathing: Min guard;Sit to/from stand   Upper Body Dressing : Supervision/safety;Set up;Sitting   Lower Body Dressing: Min guard;Sit to/from stand Lower Body Dressing Details (indicate cue type and reason): Educating pt on LB dressing techniques and to don right leg first. Min Guard A  for safety in standing. Pt able to don socks at EOB without physical A Toilet Transfer: Min guard;RW;Ambulation(Simulated in room) Toilet Transfer Details (indicate cue type and reason): Educating pt on use of 3N1 as toilet riser     Tub/ Shower Transfer: Tub transfer;Min guard;Ambulation;3 in 1;Rolling walker Tub/Shower Transfer Details (indicate cue type and reason): Providing education and handout on tub transfer with 3N1. Pt demosntrating udnerstanding with Min Guard A for safety Functional mobility during ADLs: Min guard;Rolling walker General ADL Comments: Providing education on LB ADLs, toileting, and tub transfer with 3N1. Pt performing ADLs and fucntional mobility at [employer] A level     Vision         Perception     Praxis      Pertinent Vitals/Pain Pain Assessment: Faces Faces Pain Scale: Hurts little more Pain Location: R knee  Pain Descriptors / Indicators: Grimacing;Operative site guarding;Sharp Pain Intervention(s): Monitored during session;Repositioned     Hand Dominance     Extremity/Trunk Assessment Upper Extremity Assessment Upper Extremity Assessment: Overall [facility]  for tasks assessed   Lower Extremity Assessment Lower Extremity Assessment: Defer to PT evaluation RLE Deficits / Details: Reports some decreased sensation in groin area. Deficits  consistent with post op pain and weakness.    Cervical / Trunk Assessment Cervical / Trunk Assessment: Normal   Communication Communication Communication: No difficulties   Cognition Arousal/Alertness: Awake/alert Behavior During Therapy: WFL for tasks assessed/performed Overall Cognitive Status: Within Functional Limits for tasks assessed                                     General Comments  VSS    Exercises     Shoulder Instructions      Home Living Family/patient expects to be discharged to:: Private residence Living Arrangements: Spouse/significant other Available Help at Discharge: Family;Available 24 hours/day Type of Home: House Home Access: Stairs to enter [employer] of Steps: 2 Entrance Stairs-Rails: None Home Layout: One level     Bathroom Shower/Tub: Teacher, early years/pre: Standard     Home Equipment: Environmental consultant - 2 wheels;Bedside commode          Prior Functioning/Environment Level of Independence: Independent                 OT Problem List: Decreased strength;Decreased activity tolerance;Impaired balance (sitting and/or standing);Decreased knowledge of use of DME or AE;Decreased knowledge of precautions;Pain      OT Treatment/Interventions:      OT Goals(Current goals can be found in the care plan section) Acute Rehab OT Goals Patient Stated Goal: "Get home" OT Goal Formulation: All assessment and education complete, DC therapy  OT Frequency:     Barriers to D/C:            Co-evaluation              AM-PAC PT "6 Clicks" Daily Activity     Outcome Measure Help from another person eating meals?: None Help from another person taking care of personal grooming?: None Help from another person toileting, which includes using toliet, bedpan, or urinal?: A Little Help from another person bathing (including washing, rinsing, drying)?: A Little Help from another person to put on and taking off  regular upper body clothing?: None Help from another person to put on and taking off regular lower body clothing?: A Little 6 Click Score: 21   End of Session Equipment Utilized During Treatment: Gait belt;Rolling walker Nurse Communication: Mobility status;Precautions;Weight bearing status  Activity Tolerance: Patient tolerated treatment well Patient left: in bed;with call bell/phone within reach;with nursing/sitter in room  OT Visit Diagnosis: Unsteadiness on feet (R26.81);Other abnormalities of gait and mobility (R26.89);Pain Pain - Right/Left: Right Pain - part of body: Knee                Time: 8478-4128 OT Time Calculation (min): 17 min Charges:  OT General Charges $OT Visit: 1 Visit OT Evaluation $OT Eval Low Complexity: 1 Low  Yekaterina Escutia MSOT, OTR/L Acute Rehab Pager: [phone] Office: [place] 09/13/2018, 3:27 PM

## 2018-09-13 NOTE — Telephone Encounter (Signed)
Apply pressure.  I'll see it during lunch

## 2018-09-13 NOTE — Progress Notes (Signed)
Nurse was called to pt room and noted blood coming from the surgery site through the ace wrap dressing, nurse reinforced the dressing with 2 more acewrap dressing and made it a little bit tighter, site rechecked an hour time and the nurse did not see any blood again at 0530, Toradol  And tylenol given, cold therapy applied will continue to monitor patient, thanks

## 2018-09-13 NOTE — Telephone Encounter (Signed)
See message below °

## 2018-09-13 NOTE — Progress Notes (Signed)
OT Cancellation Note  Patient Details Name: Melanie Phillips MRN: [id number] DOB: May 06, 1966   Cancelled Treatment:    Reason Eval/Treat Not Completed: Medical issues which prohibited therapy(Upon arrival, pt supine in bed and bandage at right knee soaked and blood running down to pt's ankle. Notified RN. RN aware and waiting for return call from PA to change bandage. Cleaned pt's shin and ankle. Will return as schedule allows and pt's bandage changed. Thank you.)  [place], OTR/L Acute Rehab Pager: [phone] Office: [phone]  09/13/2018, 9:00 AM

## 2018-09-13 NOTE — Progress Notes (Signed)
Physical Therapy Treatment Patient Details Name: Melanie Phillips MRN: [id number] DOB: 16-Apr-1966 Today's Date: 09/13/2018    History of Present Illness Pt is a 52 y/o female s/p R TKA. PMH includes anxiety, DM, HTN, depression.     PT Comments    Patient is making progress toward PT goals and tolerated session well. Pt is able to ambulate 251ft with RW and supervision and ascend/descend steps simulating home entrance with min A and RW. Continue to progress as tolerated.    Follow Up Recommendations  Follow surgeon's recommendation for DC plan and follow-up therapies;Supervision for mobility/OOB     Equipment Recommendations  None recommended by PT    Recommendations for Other Services OT consult     Precautions / Restrictions Precautions Precautions: Knee Precaution Booklet Issued: Yes (comment) Precaution Comments: Reviewed knee precautions with pt.  Restrictions Weight Bearing Restrictions: Yes RLE Weight Bearing: Weight bearing as tolerated    Mobility  Bed Mobility Overal bed mobility: Modified Independent Bed Mobility: Supine to Sit;Sit to Supine     Supine to sit: Supervision     General bed mobility comments: increased time and effort  Transfers Overall transfer level: Needs assistance Equipment used: Rolling walker (2 wheeled) Transfers: Sit to/from Stand Sit to Stand: Min guard         General transfer comment: cues for safe hand placement  Ambulation/Gait Ambulation/Gait assistance: Supervision Gait Distance (Feet): 200 Feet Assistive device: Rolling walker (2 wheeled) Gait Pattern/deviations: Step-through pattern;Decreased stance time - right;Decreased step length - left;Decreased dorsiflexion - right;Decreased weight shift to right;Antalgic Gait velocity: decreased   General Gait Details: cues for posture, decreased reliance on bilat UE support, and R knee flexion during swing phase   Stairs Stairs: Yes Stairs assistance: Min assist Stair  Management: No rails;Step to pattern;Backwards;With walker Number of Stairs: 2 General stair comments: cues for sequencing and technique and assist to stabilize RW   Wheelchair Mobility    Modified Rankin (Stroke Patients Only)       Balance Overall balance assessment: Needs assistance Sitting-balance support: No upper extremity supported;Feet supported Sitting balance-Leahy Scale: Good     Standing balance support: Bilateral upper extremity supported;During functional activity Standing balance-Leahy Scale: Poor Standing balance comment: Reliant on BUE support                             Cognition Arousal/Alertness: Awake/alert Behavior During Therapy: WFL for tasks assessed/performed Overall Cognitive Status: Within Functional Limits for tasks assessed                                        Exercises Total Joint Exercises Long Arc Quad: AROM;Right;10 reps;Seated Knee Flexion: AROM;AAROM;Right;10 reps;Seated(10 second holds)    General Comments General comments (skin integrity, edema, etc.): VSS      Pertinent Vitals/Pain Pain Assessment: Faces Faces Pain Scale: Hurts little more Pain Location: R knee  Pain Descriptors / Indicators: Guarding;Sore Pain Intervention(s): Limited activity within patient's tolerance;Monitored during session;Repositioned;Premedicated before session    [place] expects to be discharged to:: Private residence Living Arrangements: Spouse/significant other Available Help at Discharge: Family;Available 24 hours/day Type of Home: House Home Access: Stairs to enter Entrance Stairs-Rails: None Home Layout: One level Home Equipment: Environmental consultant - 2 wheels;Bedside commode      Prior Function Level of Independence: Independent  PT Goals (current goals can now be found in the care plan section) Acute Rehab PT Goals Patient Stated Goal: "Get home" Progress towards PT goals: Progressing  toward goals    Frequency    7X/week      PT Plan Current plan remains appropriate    Co-evaluation              AM-PAC PT "6 Clicks" Daily Activity  Outcome Measure  Difficulty turning over in bed (including adjusting bedclothes, sheets and blankets)?: A Little Difficulty moving from lying on back to sitting on the side of the bed? : A Little Difficulty sitting down on and standing up from a chair with arms (e.g., wheelchair, bedside commode, etc,.)?: Unable Help needed moving to and from a bed to chair (including a wheelchair)?: A Little Help needed walking in hospital room?: A Little Help needed climbing 3-5 steps with a railing? : A Little 6 Click Score: 16    End of Session Equipment Utilized During Treatment: Gait belt Activity Tolerance: Patient tolerated treatment well Patient left: with call bell/phone within reach;in bed;with family/visitor present Nurse Communication: Mobility status PT Visit Diagnosis: Unsteadiness on feet (R26.81);Other abnormalities of gait and mobility (R26.89);Pain Pain - Right/Left: Right Pain - part of body: Knee     Time: 1287-8676 PT Time Calculation (min) (ACUTE ONLY): 30 min  Charges:  $Gait Training: 8-22 mins $Therapeutic Activity: 8-22 mins                     Earney Navy, PTA Acute Rehabilitation Services Pager: [phone] Office: [phone]     Darliss Cheney 09/13/2018, 4:59 PM

## 2018-09-14 ENCOUNTER — Encounter (HOSPITAL_COMMUNITY): Payer: Self-pay | Admitting: *Deleted

## 2018-09-14 LAB — GLUCOSE, CAPILLARY
GLUCOSE-CAPILLARY: 112 mg/dL — AB (ref 70–99)
GLUCOSE-CAPILLARY: 185 mg/dL — AB (ref 70–99)
GLUCOSE-CAPILLARY: 163 mg/dL — AB (ref 70–99)
Glucose-Capillary: 113 mg/dL — ABNORMAL HIGH (ref 70–99)

## 2018-09-14 MED ORDER — SULFAMETHOXAZOLE-TRIMETHOPRIM 800-160 MG PO TABS
1.0000 | ORAL_TABLET | Freq: Two times a day (BID) | ORAL | Status: DC
  Administered 2018-09-14 – 2018-09-15 (×3): 1 via ORAL
  Filled 2018-09-14 (×3): qty 1

## 2018-09-14 MED ORDER — SULFAMETHOXAZOLE-TRIMETHOPRIM 800-160 MG PO TABS: 1.0000 | ORAL_TABLET | Freq: Two times a day (BID) | ORAL | 0 refills | Status: DC

## 2018-09-14 MED ORDER — ASPIRIN 81 MG PO CHEW
81.0000 mg | CHEWABLE_TABLET | Freq: Every day | ORAL | Status: DC
  Administered 2018-09-14 – 2018-09-15 (×2): 81 mg via ORAL
  Filled 2018-09-14 (×3): qty 1

## 2018-09-14 NOTE — Progress Notes (Signed)
Physical Therapy Treatment Patient Details Name: Melanie Phillips MRN: [id number] DOB: [birth year] Today's Date: 09/14/2018    History of Present Illness Pt is a 52 y/o female s/p R TKA. PMH includes anxiety, DM, HTN, depression.     PT Comments    Patient seen for mobility progression. This session focused on gait training and LE therex. Continue to progress as tolerated. Current plan remains appropriate.    Follow Up Recommendations  Follow surgeon's recommendation for DC plan and follow-up therapies;Supervision for mobility/OOB     Equipment Recommendations  None recommended by PT    Recommendations for Other Services OT consult     Precautions / Restrictions Precautions Precautions: Knee Precaution Comments: Reviewed knee precautions/positioning with pt  Restrictions Weight Bearing Restrictions: Yes RLE Weight Bearing: Weight bearing as tolerated    Mobility  Bed Mobility Overal bed mobility: Modified Independent Bed Mobility: Supine to Sit           General bed mobility comments: increased time and effort  Transfers Overall transfer level: Needs assistance Equipment used: Rolling walker (2 wheeled) Transfers: Sit to/from Stand Sit to Stand: Min guard         General transfer comment: min guard for safety; cues for safe hand placement  Ambulation/Gait Ambulation/Gait assistance: Supervision;Min guard Gait Distance (Feet): 160 Feet Assistive device: Rolling walker (2 wheeled) Gait Pattern/deviations: Step-through pattern;Decreased stance time - right;Decreased step length - left;Decreased dorsiflexion - right;Decreased weight shift to right;Antalgic;Trunk flexed;Decreased step length - right Gait velocity: decreased   General Gait Details: cues for posture, increased bilat step lengths, and increased R knee flexion during swing phase   Stairs             Wheelchair Mobility    Modified Rankin (Stroke Patients Only)       Balance Overall  balance assessment: Needs assistance Sitting-balance support: No upper extremity supported;Feet supported Sitting balance-Leahy Scale: Good     Standing balance support: Bilateral upper extremity supported;During functional activity Standing balance-Leahy Scale: Poor                              Cognition Arousal/Alertness: Awake/alert Behavior During Therapy: WFL for tasks assessed/performed Overall Cognitive Status: Within Functional Limits for tasks assessed                                        Exercises Total Joint Exercises Quad Sets: AROM;Both;10 reps Short Arc QuadSinclair Ship;Right;10 reps Heel Slides: AROM;Right;10 reps Hip ABduction/ADduction: AROM;Right;10 reps    General Comments        Pertinent Vitals/Pain Pain Assessment: Faces Faces Pain Scale: Hurts even more Pain Location: R knee  Pain Descriptors / Indicators: Guarding;Sore Pain Intervention(s): Limited activity within patient's tolerance;Monitored during session;Premedicated before session;Repositioned;Ice applied    Home Living                      Prior Function            PT Goals (current goals can now be found in the care plan section) Progress towards PT goals: Progressing toward goals    Frequency    7X/week      PT Plan Current plan remains appropriate    Co-evaluation              AM-PAC PT "6 Clicks" Daily Activity  Outcome Measure  Difficulty turning over in bed (including adjusting bedclothes, sheets and blankets)?: A Little Difficulty moving from lying on back to sitting on the side of the bed? : A Little Difficulty sitting down on and standing up from a chair with arms (e.g., wheelchair, bedside commode, etc,.)?: Unable Help needed moving to and from a bed to chair (including a wheelchair)?: A Little Help needed walking in hospital room?: A Little Help needed climbing 3-5 steps with a railing? : A Little 6 Click Score: 16     End of Session Equipment Utilized During Treatment: Gait belt Activity Tolerance: Patient tolerated treatment well Patient left: with call bell/phone within reach;in chair Nurse Communication: Mobility status PT Visit Diagnosis: Unsteadiness on feet (R26.81);Other abnormalities of gait and mobility (R26.89);Pain Pain - Right/Left: Right Pain - part of body: Knee     Time: 1031-5945 PT Time Calculation (min) (ACUTE ONLY): 28 min  Charges:  $Gait Training: 8-22 mins $Therapeutic Exercise: 8-22 mins                     Earney Navy, PTA Acute Rehabilitation Services Pager: [phone] Office: [phone]     Darliss Cheney 09/14/2018, 4:03 PM

## 2018-09-14 NOTE — Discharge Summary (Addendum)
Patient ID: Melanie Phillips MRN: [id number] DOB/AGE: [birth year] 54 y.o.  Admit date: 09/12/2018 Discharge date: 09/15/2018  Admission Diagnoses:  Principal Problem:   Unilateral primary osteoarthritis, right knee Active Problems:   Total knee replacement status   Discharge Diagnoses:  Same  Past Medical History:  Diagnosis Date  . Anxiety   . Arthritis   . Back pain   . Depression   . Diabetes mellitus without complication ([place])   . GERD (gastroesophageal reflux disease)   . Hyperlipidemia   . Hypertension     Surgeries: Procedure(s): RIGHT TOTAL KNEE ARTHROPLASTY on 09/12/2018   Consultants:   Discharged Condition: Improved  Hospital Course: Melanie Phillips is an 52 y.o. female who was admitted 09/12/2018 for operative treatment ofUnilateral primary osteoarthritis, right knee. Patient has severe unremitting pain that affects sleep, daily activities, and work/hobbies. After pre-op clearance the patient was taken to the operating room on 09/12/2018 and underwent  Procedure(s): RIGHT TOTAL KNEE ARTHROPLASTY.    Patient was given perioperative antibiotics:  Anti-infectives (From admission, onward)   Start     Dose/Rate Route Frequency Ordered Stop   09/15/18 0000  sulfamethoxazole-trimethoprim (BACTRIM DS,SEPTRA DS) 800-160 MG tablet        09/15/18 0756     09/14/18 1000  sulfamethoxazole-trimethoprim (BACTRIM DS,SEPTRA DS) 800-160 MG per tablet 1 tablet     1 tablet Oral Every 12 hours 09/14/18 0844     09/14/18 0000  sulfamethoxazole-trimethoprim (BACTRIM DS,SEPTRA DS) 800-160 MG tablet  Status:  Discontinued     1 tablet Oral 2 times daily 09/14/18 0845 09/15/18    09/12/18 1700  ceFAZolin (ANCEF) IVPB 2g/100 mL premix     2 g 200 mL/hr over 30 Minutes Intravenous Every 6 hours 09/12/18 1242 09/13/18 1337   09/12/18 1102  vancomycin (VANCOCIN) powder  Status:  Discontinued       As needed 09/12/18 1117 09/12/18 1234   09/12/18 0815  ceFAZolin (ANCEF) IVPB 2g/100  mL premix     2 g 200 mL/hr over 30 Minutes Intravenous On call to O.R. 09/12/18 0804 09/12/18 1020   09/12/18 0810  ceFAZolin (ANCEF) 2-4 GM/100ML-% IVPB    Note to Pharmacy:  Nyoka Cowden   : cabinet override      09/12/18 0810 09/12/18 1020       Patient was given sequential compression devices, early ambulation, and chemoprophylaxis to prevent DVT.  Patient benefited maximally from hospital stay and there were no complications.    Recent vital signs:  Patient Vitals for the past 24 hrs:  BP Temp Temp src Pulse Resp SpO2  09/15/18 0512 (!) 158/97 98.2 F (36.8 C) Oral 73 - 100 %  09/15/18 0058 (!) 166/95 - - - - -  09/14/18 2209 (!) 170/99 97.9 F (36.6 C) Oral 64 19 98 %  09/14/18 1347 125/65 97.7 F (36.5 C) Oral 73 (!) 22 97 %     Recent laboratory studies:  Recent Labs    09/13/18 0111  WBC 10.3  HGB 12.8  HCT 40.0  PLT 397  NA 140  K 4.2  CL 105  CO2 27  BUN 9  CREATININE 0.92  GLUCOSE 169*  CALCIUM 9.0     Discharge Medications:   Allergies as of 09/15/2018      Reactions   Vicodin [hydrocodone-acetaminophen] Nausea And Vomiting      Medication List    STOP taking these medications   cyclobenzaprine 10 MG tablet Commonly known as:  FLEXERIL  ibuprofen 800 MG tablet Commonly known as:  ADVIL,MOTRIN   mupirocin ointment 2 % Commonly known as:  BACTROBAN     TAKE these medications   ACCU-CHEK FASTCLIX LANCETS Misc Use as needed to check blood sugar.   aspirin EC 81 MG tablet Take 1 tablet (81 mg total) by mouth daily. What changed:  when to take this   DULoxetine 30 MG capsule Commonly known as:  CYMBALTA TAKE 1 CAPSULE BY MOUTH ONCE DAILY What changed:  when to take this   glucose blood test strip Use as instructed   lisinopril 5 MG tablet Commonly known as:  PRINIVIL,ZESTRIL TAKE 1 TABLET BY MOUTH ONCE DAILY   LUBRICANT EYE DROPS 0.4-0.3 % Soln Generic drug:  Polyethyl Glycol-Propyl Glycol Place 1 drop into both eyes 3  (three) times daily as needed (for irritated eyes.).   metFORMIN 1000 MG tablet Commonly known as:  GLUCOPHAGE TAKE 1 TABLET BY MOUTH TWICE DAILY WITH A MEAL What changed:  See the new instructions.   methocarbamol 750 MG tablet Commonly known as:  ROBAXIN Take 1 tablet (750 mg total) by mouth 2 (two) times daily as needed for muscle spasms.   omeprazole 40 MG capsule Commonly known as:  PRILOSEC Take 1 capsule (40 mg total) by mouth daily.   ondansetron 4 MG tablet Commonly known as:  ZOFRAN Take 1-2 tablets (4-8 mg total) by mouth every 8 (eight) hours as needed for nausea or vomiting.   oxyCODONE 10 mg 12 hr tablet Commonly known as:  OXYCONTIN Take 1 tablet (10 mg total) by mouth every 12 (twelve) hours for 3 days.   oxyCODONE 5 MG immediate release tablet Commonly known as:  Oxy IR/ROXICODONE Take 1-3 tablets (5-15 mg total) by mouth every 4 (four) hours as needed.   promethazine 25 MG tablet Commonly known as:  PHENERGAN Take 1 tablet (25 mg total) by mouth every 6 (six) hours as needed for nausea.   senna-docusate 8.6-50 MG tablet Commonly known as:  Senokot-S Take 1 tablet by mouth at bedtime as needed.   sulfamethoxazole-trimethoprim 800-160 MG tablet Commonly known as:  BACTRIM DS,SEPTRA DS Take one tab bid x 14 days            Durable Medical Equipment  (From admission, onward)         Start     Ordered   09/12/18 1243  DME Walker rolling  Once    Question:  Patient needs a walker to treat with the following condition  Answer:  Total knee replacement status   09/12/18 1242   09/12/18 1243  DME 3 n 1  Once     09/12/18 1242   09/12/18 1243  DME Bedside commode  Once    Question:  Patient needs a bedside commode to treat with the following condition  Answer:  Total knee replacement status   09/12/18 1242          Diagnostic Studies: Dg Chest 2 View  Result Date: 09/08/2018 CLINICAL DATA:  Preop for knee replacement EXAM: CHEST - 2 VIEW  COMPARISON:  08/10/2014 FINDINGS: Normal heart size and stable mediastinal contours. No acute infiltrate or edema. No effusion or pneumothorax. No acute osseous findings. IMPRESSION: No evidence of active disease.  Stable from 2015. Electronically Signed   By: Monte Fantasia M.D.   On: 09/08/2018 08:08   Dg Ankle Complete Right  Result Date: 09/02/2018 CLINICAL DATA:  fell in hole, twisted right ankle. EXAM: RIGHT ANKLE - COMPLETE 3+  VIEW COMPARISON:  06/03/2009 FINDINGS: Mild diffuse soft tissue swelling. No fracture, subluxation or dislocation. Small plantar calcaneal spur. Joint spaces maintained. IMPRESSION: No acute bony abnormality. Electronically Signed   By: Rolm Baptise M.D.   On: 09/02/2018 10:52   Dg Knee Right Port  Result Date: 09/12/2018 CLINICAL DATA:  Post right knee replacement.  Initial encounter. EXAM: PORTABLE RIGHT KNEE - 1-2 VIEW COMPARISON:  None. FINDINGS: Post total right knee replacement which appears in satisfactory position without complication noted. IMPRESSION: Post total right knee replacement. Electronically Signed   By: Genia Del M.D.   On: 09/12/2018 13:44    Disposition: Discharge disposition: 01-Home or Self Care         Follow-up Information    Leandrew Koyanagi, MD In 2 weeks.   Specialty:  Orthopedic Surgery Why:  For suture removal, For wound re-check Contact information: [place] [address] [phone]        [facility].   Specialty:  [place] Why:  A representative from [facility] will contact you to arrange start date anmd time for your therapy. Contact information: [address] [place] [address] [phone]            Signed: Aundra Dubin 09/15/2018, 8:00 AM

## 2018-09-14 NOTE — Care Management Note (Signed)
Case Management Note  Patient Details  Name: Melanie Phillips MRN: [id number] Date of Birth: 15-Mar-1966  Subjective/Objective:  52 yr old female s/p right total knee arthroplasty.                  Action/Plan: Case Journalist, newspaper with patient concerning discharge plan and DME. Patient was preoperatively setup with [facility], no changes. She has DME. Patient will have family support at discharge.     Expected Discharge Date:  09/15/18               Expected Discharge Plan:  [place]  In-House Referral:  NA  Discharge planning Services  CM Consult  Post Acute Care Choice:  Home Health Choice offered to:  Patient  DME Arranged:  N/A(has RW and 3in1) DME Agency:  NA  HH Arranged:  [place] Agency:  [facility] (formerly [employer])  Status of Service:  Completed, signed off  If discussed at [employer], dates discussed:    Additional Comments:  Ninfa Meeker, RN 09/14/2018, 1:11 PM

## 2018-09-14 NOTE — Progress Notes (Signed)
Physical Therapy Treatment Patient Details Name: Melanie Phillips MRN: [id number] DOB: 08-25-1966 Today's Date: 09/14/2018    History of Present Illness Pt is a 52 y/o female s/p R TKA. PMH includes anxiety, DM, HTN, depression.     PT Comments    Patient seen for mobility progression. Pt reports increased pain this am compared to previous sessions despite premedication and with heavy reliance on RW for transfers and gait training. Pt tolerated ambulating 249ft with increased time and supervision/min guard for safety. Continue to progress as tolerated.    Follow Up Recommendations  Follow surgeon's recommendation for DC plan and follow-up therapies;Supervision for mobility/OOB     Equipment Recommendations  None recommended by PT    Recommendations for Other Services OT consult     Precautions / Restrictions Precautions Precautions: Knee Precaution Comments: Reviewed knee precautions with pt.  Restrictions Weight Bearing Restrictions: Yes RLE Weight Bearing: Weight bearing as tolerated    Mobility  Bed Mobility Overal bed mobility: Modified Independent Bed Mobility: Supine to Sit;Sit to Supine           General bed mobility comments: increased time and effort  Transfers Overall transfer level: Needs assistance Equipment used: Rolling walker (2 wheeled) Transfers: Sit to/from Stand Sit to Stand: Min guard         General transfer comment: min guard for safety; cues for safe hand placement  Ambulation/Gait Ambulation/Gait assistance: Supervision;Min guard Gait Distance (Feet): 200 Feet Assistive device: Rolling walker (2 wheeled) Gait Pattern/deviations: Step-through pattern;Decreased stance time - right;Decreased step length - left;Decreased dorsiflexion - right;Decreased weight shift to right;Antalgic;Trunk flexed Gait velocity: decreased   General Gait Details: cues for increased bilat step lengths, posture, and safe use of AD; pt with heavy reliance on RW     Stairs             Wheelchair Mobility    Modified Rankin (Stroke Patients Only)       Balance Overall balance assessment: Needs assistance Sitting-balance support: No upper extremity supported;Feet supported Sitting balance-Leahy Scale: Good     Standing balance support: Bilateral upper extremity supported;During functional activity Standing balance-Leahy Scale: Poor                              Cognition Arousal/Alertness: Awake/alert Behavior During Therapy: WFL for tasks assessed/performed Overall Cognitive Status: Within Functional Limits for tasks assessed                                        Exercises      General Comments        Pertinent Vitals/Pain Pain Assessment: Faces Faces Pain Scale: Hurts even more Pain Location: R knee  Pain Descriptors / Indicators: Guarding;Sore Pain Intervention(s): Limited activity within patient's tolerance;Monitored during session;Premedicated before session;Repositioned;Ice applied    Home Living                      Prior Function            PT Goals (current goals can now be found in the care plan section) Progress towards PT goals: Progressing toward goals    Frequency    7X/week      PT Plan Current plan remains appropriate    Co-evaluation              AM-PAC  PT "6 Clicks" Daily Activity  Outcome Measure  Difficulty turning over in bed (including adjusting bedclothes, sheets and blankets)?: A Little Difficulty moving from lying on back to sitting on the side of the bed? : A Little Difficulty sitting down on and standing up from a chair with arms (e.g., wheelchair, bedside commode, etc,.)?: Unable Help needed moving to and from a bed to chair (including a wheelchair)?: A Little Help needed walking in hospital room?: A Little Help needed climbing 3-5 steps with a railing? : A Little 6 Click Score: 16    End of Session Equipment Utilized During  Treatment: Gait belt Activity Tolerance: Patient limited by pain Patient left: with call bell/phone within reach;in bed;with SCD's reapplied Nurse Communication: Mobility status PT Visit Diagnosis: Unsteadiness on feet (R26.81);Other abnormalities of gait and mobility (R26.89);Pain Pain - Right/Left: Right Pain - part of body: Knee     Time: 1105-1130 PT Time Calculation (min) (ACUTE ONLY): 25 min  Charges:  $Gait Training: 23-37 mins                     Earney Navy, PTA Acute Rehabilitation Services Pager: [phone] Office: [phone]     Darliss Cheney 09/14/2018, 11:38 AM

## 2018-09-14 NOTE — Progress Notes (Addendum)
Subjective: 2 Days Post-Op Procedure(s) (LRB): RIGHT TOTAL KNEE ARTHROPLASTY (Right) Patient reports pain as moderate.  Still draining from right knee  Objective: Vital signs in last 24 hours: Temp:  [97.9 F (36.6 C)-98.4 F (36.9 C)] 98 F (36.7 C) (10/16 0455) Pulse Rate:  [62-71] 62 (10/16 0456) Resp:  [20] 20 (10/16 0455) BP: (138-158)/(76-95) 158/95 (10/16 0455) SpO2:  [92 %-100 %] 99 % (10/16 0456)  Intake/Output from previous day: 10/15 0701 - 10/16 0700 In: 960 [P.O.:960] Out: 0  Intake/Output this shift: No intake/output data recorded.  Recent Labs    09/13/18 0111  HGB 12.8   Recent Labs    09/13/18 0111  WBC 10.3  RBC 4.32  HCT 40.0  PLT 397   Recent Labs    09/13/18 0111  NA 140  K 4.2  CL 105  CO2 27  BUN 9  CREATININE 0.92  GLUCOSE 169*  CALCIUM 9.0   No results for input(s): LABPT, INR in the last 72 hours.  Neurologically intact Neurovascular intact Sensation intact distally Intact pulses distally Dorsiflexion/Plantar flexion intact Incision: moderate drainage No cellulitis present Compartment soft  Anticipated LOS equal to or greater than 2 midnights due to - Age 85 and older with one or more of the following:  - Obesity  - Expected need for hospital services (PT, OT, Nursing) required for safe  discharge  - Anticipated need for postoperative skilled nursing care or inpatient rehab  - Active co-morbidities: Diabetes OR   - Unanticipated findings during/Post Surgery: Slow post-op progression: GI, pain control, mobility  - Patient is a high risk of re-admission due to: None   Assessment/Plan: 2 Days Post-Op Procedure(s) (LRB): RIGHT TOTAL KNEE ARTHROPLASTY (Right) Advance diet Up with therapy D/C IV fluids Plan for discharge tomorrow home with hhpt WBAT RLE Will apply wound vac due to continued bleeding from surgical site Will change asa from bid to qd and will d/c celebrex    Aundra Dubin 09/14/2018, 8:03 AM

## 2018-09-15 LAB — GLUCOSE, CAPILLARY
Glucose-Capillary: 126 mg/dL — ABNORMAL HIGH (ref 70–99)
Glucose-Capillary: 125 mg/dL — ABNORMAL HIGH (ref 70–99)

## 2018-09-15 MED ORDER — SULFAMETHOXAZOLE-TRIMETHOPRIM 800-160 MG PO TABS: ORAL_TABLET | ORAL | 0 refills | Status: DC

## 2018-09-15 NOTE — Progress Notes (Signed)
Patient ID: Melanie Phillips, female   DOB: 07-28-1966, 52 y.o.   MRN: [id number]   Wound vac removed by me and pressure bandage applied.  Will call nurse to have thigh high ted hose placed to RLE prior to d/c.

## 2018-09-15 NOTE — Progress Notes (Signed)
Discharge instructions completed with pt.  Pt verbalized understanding of the information.  Pt denies chest pain, shortness of breath, dizziness, lightheadedness, and n/v.  Thigh high TED hose applied to right leg.  Pt discharged home.

## 2018-09-15 NOTE — Plan of Care (Signed)
  Problem: Education: Goal: Knowledge of General Education information will improve Description: Including pain rating scale, medication(s)/side effects and non-pharmacologic comfort measures Outcome: Progressing   Problem: Activity: Goal: Risk for activity intolerance will decrease Outcome: Progressing   Problem: Elimination: Goal: Will not experience complications related to bowel motility Outcome: Progressing Goal: Will not experience complications related to urinary retention Outcome: Progressing   

## 2018-09-15 NOTE — Progress Notes (Signed)
Physical Therapy Treatment Patient Details Name: Melanie Phillips MRN: [id number] DOB: Dec 12, 1965 Today's Date: 09/15/2018    History of Present Illness Pt is a 52 y/o female s/p R TKA. PMH includes anxiety, DM, HTN, depression.     PT Comments    Patient seen for gait and stair training and tolerated session well. Continue to progress as tolerated.    Follow Up Recommendations  Follow surgeon's recommendation for DC plan and follow-up therapies;Supervision for mobility/OOB     Equipment Recommendations  None recommended by PT    Recommendations for Other Services OT consult     Precautions / Restrictions Precautions Precautions: Knee Precaution Comments: Reviewed knee precautions/positioning with pt  Restrictions Weight Bearing Restrictions: Yes RLE Weight Bearing: Weight bearing as tolerated    Mobility  Bed Mobility Overal bed mobility: Modified Independent Bed Mobility: Supine to Sit;Sit to Supine           General bed mobility comments: increased time and effort  Transfers Overall transfer level: Needs assistance Equipment used: Rolling walker (2 wheeled) Transfers: Sit to/from Stand Sit to Stand: Supervision         General transfer comment: for safety  Ambulation/Gait Ambulation/Gait assistance: Supervision Gait Distance (Feet): 150 Feet Assistive device: Rolling walker (2 wheeled) Gait Pattern/deviations: Step-through pattern;Decreased stance time - right;Decreased dorsiflexion - right;Decreased weight shift to right;Antalgic;Trunk flexed;Decreased step length - right Gait velocity: decreased   General Gait Details: cues for R knee flexion during swing phase, R heel strike/toe off, and step lenght symmetry; pt demonstrates improving step through pattern and stride length compared to previous session    Stairs Stairs: Yes Stairs assistance: Min assist Stair Management: No rails;Step to pattern;Backwards;With walker Number of Stairs: 2 General  stair comments: pt demonstrates carry over of sequencing and requires min cues for RW placement; assist to stabilize RW    Wheelchair Mobility    Modified Rankin (Stroke Patients Only)       Balance Overall balance assessment: Needs assistance Sitting-balance support: No upper extremity supported;Feet supported Sitting balance-Leahy Scale: Good     Standing balance support: Bilateral upper extremity supported;During functional activity Standing balance-Leahy Scale: Poor                              Cognition Arousal/Alertness: Awake/alert Behavior During Therapy: WFL for tasks assessed/performed Overall Cognitive Status: Within Functional Limits for tasks assessed                                        Exercises      General Comments        Pertinent Vitals/Pain Pain Assessment: Faces Faces Pain Scale: Hurts little more Pain Location: R knee  Pain Descriptors / Indicators: Guarding;Sore Pain Intervention(s): Monitored during session;Repositioned;Ice applied    Home Living                      Prior Function            PT Goals (current goals can now be found in the care plan section) Progress towards PT goals: Progressing toward goals    Frequency    7X/week      PT Plan Current plan remains appropriate    Co-evaluation              AM-PAC PT "6 Clicks" Daily Activity  Outcome Measure  Difficulty turning over in bed (including adjusting bedclothes, sheets and blankets)?: A Little Difficulty moving from lying on back to sitting on the side of the bed? : A Little Difficulty sitting down on and standing up from a chair with arms (e.g., wheelchair, bedside commode, etc,.)?: Unable Help needed moving to and from a bed to chair (including a wheelchair)?: A Little Help needed walking in hospital room?: A Little Help needed climbing 3-5 steps with a railing? : A Little 6 Click Score: 16    End of Session  Equipment Utilized During Treatment: Gait belt Activity Tolerance: Patient tolerated treatment well Patient left: with call bell/phone within reach;in bed Nurse Communication: Mobility status PT Visit Diagnosis: Unsteadiness on feet (R26.81);Other abnormalities of gait and mobility (R26.89);Pain Pain - Right/Left: Right Pain - part of body: Knee     Time: 6720-9470 PT Time Calculation (min) (ACUTE ONLY): 28 min  Charges:  $Gait Training: 8-22 mins $Therapeutic Activity: 8-22 mins                     Earney Navy, PTA Acute Rehabilitation Services Pager: [phone] Office: [phone]     Darliss Cheney 09/15/2018, 9:49 AM

## 2018-09-15 NOTE — Progress Notes (Signed)
Subjective: 3 Days Post-Op Procedure(s) (LRB): RIGHT TOTAL KNEE ARTHROPLASTY (Right) Patient reports pain as mild.  Doing well this am.  Vac has 150 bloody output which has slowed down.  Objective: Vital signs in last 24 hours: Temp:  [97.7 F (36.5 C)-98.2 F (36.8 C)] 98.2 F (36.8 C) (10/17 0512) Pulse Rate:  [64-73] 73 (10/17 0512) Resp:  [19-22] 19 (10/16 2209) BP: (125-170)/(65-99) 158/97 (10/17 0512) SpO2:  [97 %-100 %] 100 % (10/17 0512)  Intake/Output from previous day: 10/16 0701 - 10/17 0700 In: 480 [P.O.:480] Out: -  Intake/Output this shift: No intake/output data recorded.  Recent Labs    09/13/18 0111  HGB 12.8   Recent Labs    09/13/18 0111  WBC 10.3  RBC 4.32  HCT 40.0  PLT 397   Recent Labs    09/13/18 0111  NA 140  K 4.2  CL 105  CO2 27  BUN 9  CREATININE 0.92  GLUCOSE 169*  CALCIUM 9.0   No results for input(s): LABPT, INR in the last 72 hours.  Neurologically intact Neurovascular intact Sensation intact distally Intact pulses distally Dorsiflexion/Plantar flexion intact Incision: dressing C/D/I No cellulitis present Compartment soft  Anticipated LOS equal to or greater than 2 midnights due to - Age 54 and older with one or more of the following:  - Obesity  - Expected need for hospital services (PT, OT, Nursing) required for safe  discharge  - Anticipated need for postoperative skilled nursing care or inpatient rehab  - Active co-morbidities: Diabetes OR   - Unanticipated findings during/Post Surgery: Slow post-op progression: GI, pain control, mobility  - Patient is a high risk of re-admission due to: None   Vac output is ~150 blood   Assessment/Plan: 3 Days Post-Op Procedure(s) (LRB): RIGHT TOTAL KNEE ARTHROPLASTY (Right) Advance diet Up with therapy Discharge home with home health later this afternoon WBAT RLE Continue wound vac.  Will return later this afternoon to remove vac and apply pressure dressing and thigh  high ted hose prior to d/c     Aundra Dubin 09/15/2018, 7:59 AM

## 2018-09-17 DIAGNOSIS — Z471 Aftercare following joint replacement surgery: Secondary | ICD-10-CM | POA: Diagnosis not present

## 2018-09-19 ENCOUNTER — Telehealth (INDEPENDENT_AMBULATORY_CARE_PROVIDER_SITE_OTHER): Payer: Self-pay | Admitting: Orthopaedic Surgery

## 2018-09-19 NOTE — Telephone Encounter (Signed)
[facility]  ([place] Verbal orders  1 time a week for 1 week  Twice  a week for 2 weeks

## 2018-09-19 NOTE — Telephone Encounter (Signed)
I called Melanie Phillips and advised. 

## 2018-09-19 NOTE — Telephone Encounter (Signed)
Ok for orders? 

## 2018-09-19 NOTE — Telephone Encounter (Signed)
yes

## 2018-09-20 ENCOUNTER — Telehealth (INDEPENDENT_AMBULATORY_CARE_PROVIDER_SITE_OTHER): Payer: Self-pay | Admitting: Orthopaedic Surgery

## 2018-09-20 MED ORDER — OXYCODONE-ACETAMINOPHEN 5-325 MG PO TABS: ORAL_TABLET | ORAL | 0 refills | Status: DC

## 2018-09-20 NOTE — Telephone Encounter (Signed)
Patient left a voicemail stating she was in a lot of pain from the TKR she had, she is requesting refill on rx. # [phone]

## 2018-09-20 NOTE — Telephone Encounter (Signed)
IC advised could pick up at front desk.  

## 2018-09-20 NOTE — Telephone Encounter (Signed)
Ok refill #30 of percocet.  1-2 tab bid prn

## 2018-09-20 NOTE — Telephone Encounter (Signed)
Please advise. Thanks.  

## 2018-09-23 ENCOUNTER — Ambulatory Visit (INDEPENDENT_AMBULATORY_CARE_PROVIDER_SITE_OTHER): Payer: Medicaid Other | Admitting: Orthopaedic Surgery

## 2018-09-23 ENCOUNTER — Telehealth (INDEPENDENT_AMBULATORY_CARE_PROVIDER_SITE_OTHER): Payer: Self-pay | Admitting: Orthopaedic Surgery

## 2018-09-23 DIAGNOSIS — M1711 Unilateral primary osteoarthritis, right knee: Secondary | ICD-10-CM

## 2018-09-23 DIAGNOSIS — Z96651 Presence of right artificial knee joint: Secondary | ICD-10-CM

## 2018-09-23 MED ORDER — OXYCODONE HCL 5 MG PO TABS: 5.0000 mg | ORAL_TABLET | Freq: Four times a day (QID) | ORAL | 0 refills | Status: DC | PRN

## 2018-09-23 NOTE — Progress Notes (Signed)
Post-Op Visit Note   Patient: Melanie Phillips           Date of Birth: March 22, 1966           MRN: [id number] Visit Date: 09/23/2018 PCP: Steve Rattler, DO   Assessment & Plan:  Chief Complaint:  Chief Complaint  Patient presents with  . Right Knee - Routine Post Op    S/p TKA 09/12/18   Visit Diagnoses:  1. Unilateral primary osteoarthritis, right knee   2. Status post right knee replacement     Plan: Breckin is 11 days status post right total knee replacement.  She is doing well overall.  Her surgical incision is clean dry and intact.  The drainage is stopped.  Denies any constitutional symptoms.  She is able to flex to 80 degrees.  Collaterals are stable.  Calf is nontender.  Patient is doing well overall.  She knows to continue with home health physical therapy.  She will call us when she has been discharged from home therapy.  Recheck in 4 weeks with three-view x-rays of the right knee.  Follow-Up Instructions: Return in about 4 weeks (around 10/21/2018).   Orders:  No orders of the defined types were placed in this encounter.  No orders of the defined types were placed in this encounter.   Imaging: No results found.  PMFS History: Patient Active Problem List   Diagnosis Date Noted  . Total knee replacement status 09/12/2018  . Unilateral primary osteoarthritis, right knee 08/11/2018  . Trigger finger of right thumb 02/18/2018  . Right leg pain 08/16/2017  . Tenosynovitis of finger 05/26/2017  . Trigger thumb, left thumb 05/18/2017  . Arthritis of carpometacarpal [facility]) joint of right thumb 03/10/2017  . Essential hypertension 03/04/2017  . Healthcare maintenance 03/04/2017  . Depression 03/09/2016  . Carpal tunnel syndrome 03/09/2016  . Diabetes mellitus, type 2 ([place]) 08/15/2014  . Poor dental hygiene 08/15/2014  . Sciatica 05/31/2012  . OBESITY, UNSPECIFIED 03/07/2010  . TOBACCO ABUSE 03/07/2010  . PAIN IN JOINT, MULTIPLE SITES 03/07/2010   Past Medical  History:  Diagnosis Date  . Anxiety   . Arthritis   . Back pain   . Depression   . Diabetes mellitus without complication ([place])   . GERD (gastroesophageal reflux disease)   . Hyperlipidemia   . Hypertension     Family History  Problem Relation Age of Onset  . Diabetes Mother   . Diabetes Sister     Past Surgical History:  Procedure Laterality Date  . CARPAL TUNNEL RELEASE Right 03/10/2017   Procedure: CARPAL TUNNEL RELEASE;  Surgeon: Leandrew Koyanagi, MD;  Location: [place];  Service: Orthopedics;  Laterality: Right;  . CARPOMETACARPEL SUSPENSION PLASTY Right 03/10/2017   Procedure: Right thumb Ligament Reconstruction Tendon Interposition, Right Carpal tunnel release;  Surgeon: Leandrew Koyanagi, MD;  Location: [place];  Service: Orthopedics;  Laterality: Right;  . JOINT REPLACEMENT    . 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   Procedure: RIGHT TOTAL KNEE ARTHROPLASTY;  Surgeon: Leandrew Koyanagi, MD;  Location: [place];  Service: Orthopedics;  Laterality: Right;  . TRIGGER FINGER RELEASE Left 05/26/2017   Procedure: RELEASE TRIGGER FINGER LEFT THUMB;  Surgeon: Leandrew Koyanagi, MD;  Location: [place];  Service: Orthopedics;  Laterality: Left;  . TRIGGER FINGER RELEASE Right 02/23/2018   Procedure: RELEASE RIGHT TRIGGER THUMB;  Surgeon: Leandrew Koyanagi, MD;  Location: [place];  Service:  Orthopedics;  Laterality: Right;  Bier block  . TUBAL LIGATION     Social History   Occupational History  . Not on file  Tobacco Use  . Smoking status: Current Every Day Smoker    Packs/day: 0.50    Years: 33.00    Pack years: 16.50    Types: Cigarettes  . Smokeless tobacco: Never Used  Substance and Sexual Activity  . Alcohol use: Yes    Alcohol/week: 0.0 standard drinks    Comment: rarely  . Drug use: No  . Sexual activity: Yes    Birth control/protection: Surgical

## 2018-09-23 NOTE — Telephone Encounter (Signed)
Randall Hiss, PT with [facility] says patient has been refusing home health physical therapy and has been avoiding making an appt. He said her Medicaid runs out this week and is wondering if he can go ahead and get her set up with outpatient PT.  # [phone]

## 2018-09-23 NOTE — Telephone Encounter (Signed)
Patient refusing HHPT do you want to refer her to outpatient therapy?

## 2018-09-30 ENCOUNTER — Ambulatory Visit
Admission: RE | Admit: 2018-09-30 | Discharge: 2018-09-30 | Disposition: A | Discharge status: Home / Self Care | Payer: Medicaid Other | Source: Ambulatory Visit | Attending: Obstetrics and Gynecology | Admitting: Obstetrics and Gynecology

## 2018-09-30 ENCOUNTER — Ambulatory Visit
Admission: RE | Admit: 2018-09-30 | Discharge: 2018-09-30 | Disposition: A | Discharge status: Home / Self Care | Payer: No Typology Code available for payment source | Source: Ambulatory Visit | Attending: Obstetrics and Gynecology | Admitting: Obstetrics and Gynecology

## 2018-09-30 ENCOUNTER — Other Ambulatory Visit (INDEPENDENT_AMBULATORY_CARE_PROVIDER_SITE_OTHER): Payer: Self-pay | Admitting: Physician Assistant

## 2018-09-30 ENCOUNTER — Ambulatory Visit: Payer: No Typology Code available for payment source | Admitting: Family Medicine

## 2018-09-30 ENCOUNTER — Telehealth (INDEPENDENT_AMBULATORY_CARE_PROVIDER_SITE_OTHER): Payer: Self-pay | Admitting: Orthopaedic Surgery

## 2018-09-30 DIAGNOSIS — N6320 Unspecified lump in the left breast, unspecified quadrant: Secondary | ICD-10-CM

## 2018-09-30 DIAGNOSIS — N6489 Other specified disorders of breast: Secondary | ICD-10-CM | POA: Diagnosis not present

## 2018-09-30 DIAGNOSIS — R928 Other abnormal and inconclusive findings on diagnostic imaging of breast: Secondary | ICD-10-CM | POA: Diagnosis not present

## 2018-09-30 MED ORDER — OXYCODONE-ACETAMINOPHEN 5-325 MG PO TABS: 1.0000 | ORAL_TABLET | Freq: Three times a day (TID) | ORAL | 0 refills | Status: DC | PRN

## 2018-09-30 NOTE — Telephone Encounter (Signed)
Patient states she needs a referral for a Outpatient felicity. Patient is also requesting more pain medication.  Patients # [phone]

## 2018-09-30 NOTE — Telephone Encounter (Signed)
Will bring both to you

## 2018-09-30 NOTE — Telephone Encounter (Signed)
See message.

## 2018-10-02 NOTE — Telephone Encounter (Signed)
Yes please

## 2018-10-03 NOTE — Telephone Encounter (Signed)
What do you want me to send her for? ROM and strengthening?

## 2018-10-03 NOTE — Telephone Encounter (Signed)
Order put in for outpt therapy.

## 2018-10-03 NOTE — Telephone Encounter (Signed)
[facility] outpt PT thanks.

## 2018-10-05 ENCOUNTER — Ambulatory Visit: Payer: Medicaid Other | Attending: Family Medicine | Admitting: Physical Therapy

## 2018-10-05 ENCOUNTER — Encounter: Payer: Self-pay | Admitting: Physical Therapy

## 2018-10-05 ENCOUNTER — Other Ambulatory Visit: Payer: Self-pay

## 2018-10-05 DIAGNOSIS — M6281 Muscle weakness (generalized): Secondary | ICD-10-CM | POA: Insufficient documentation

## 2018-10-05 DIAGNOSIS — R2689 Other abnormalities of gait and mobility: Secondary | ICD-10-CM | POA: Diagnosis not present

## 2018-10-05 DIAGNOSIS — M25661 Stiffness of right knee, not elsewhere classified: Secondary | ICD-10-CM | POA: Insufficient documentation

## 2018-10-05 DIAGNOSIS — M25561 Pain in right knee: Secondary | ICD-10-CM | POA: Diagnosis not present

## 2018-10-05 DIAGNOSIS — G8929 Other chronic pain: Secondary | ICD-10-CM | POA: Diagnosis not present

## 2018-10-05 DIAGNOSIS — Z96651 Presence of right artificial knee joint: Secondary | ICD-10-CM | POA: Insufficient documentation

## 2018-10-05 NOTE — Therapy (Signed)
[place], [place], [address] Phone: [phone]   Fax:  [phone]  Physical Therapy Evaluation  Patient Details  Name: Melanie Phillips MRN: [id number] Date of Birth: 07-Jul-1966 Referring Provider (PT): Frankey Shown MD   Encounter Date: 10/05/2018  PT End of Session - 10/05/18 1407    Visit Number  1    Number of Visits  17    Date for PT Re-Evaluation  12/14/18    Authorization Type  MCD    PT Start Time  1415    PT Stop Time  1455    PT Time Calculation (min)  40 min    Activity Tolerance  Patient tolerated treatment well    Behavior During Therapy  [facility] for tasks assessed/performed       Past Medical History:  Diagnosis Date  . Anxiety   . Arthritis   . Back pain   . Depression   . Diabetes mellitus without complication ([place])   . GERD (gastroesophageal reflux disease)   . Hyperlipidemia   . Hypertension     Past Surgical History:  Procedure Laterality Date  . CARPAL TUNNEL RELEASE Right 03/10/2017   Procedure: CARPAL TUNNEL RELEASE;  Surgeon: Leandrew Koyanagi, MD;  Location: [place];  Service: Orthopedics;  Laterality: Right;  . CARPOMETACARPEL SUSPENSION PLASTY Right 03/10/2017   Procedure: Right thumb Ligament Reconstruction Tendon Interposition, Right Carpal tunnel release;  Surgeon: Leandrew Koyanagi, MD;  Location: [place];  Service: Orthopedics;  Laterality: Right;  . JOINT REPLACEMENT    . 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   Procedure: RIGHT TOTAL KNEE ARTHROPLASTY;  Surgeon: Leandrew Koyanagi, MD;  Location: [place];  Service: Orthopedics;  Laterality: Right;  . TRIGGER FINGER RELEASE Left 05/26/2017   Procedure: RELEASE TRIGGER FINGER LEFT THUMB;  Surgeon: Leandrew Koyanagi, MD;  Location: [place];  Service: Orthopedics;  Laterality: Left;  . TRIGGER FINGER RELEASE Right 02/23/2018   Procedure: RELEASE RIGHT TRIGGER THUMB;  Surgeon: Leandrew Koyanagi, MD;  Location: [place];  Service: Orthopedics;  Laterality: Right;  Bier block  . TUBAL LIGATION      There were no vitals filed for this visit.   Subjective Assessment - 10/05/18 1406    Subjective  pt is a 52 y.o F s/p R TKA on 09/12/2018.  Since surgery she reports she is doing better, She did have HHPT come out for 2 visits. the pain is getting better but I do have numbness on the outside of the knee.     Limitations  Standing;Walking    How long can you sit comfortably?  15 min     How long can you stand comfortably?  15 min    How long can you walk comfortably?  15 min    Diagnostic tests  xray 09/12/2018    Patient Stated Goals  get the knee working better, decreasing the swelling    Currently in Pain?  Yes    Pain Score  6    last took medication for pain at 8am, at worst 10/10   Pain Location  Knee    Pain Orientation  Right    Pain Descriptors / Indicators  Aching;Sore;Tightness    Pain Type  Surgical pain;Chronic pain    Pain Onset  More than a month ago    Pain Frequency  Intermittent    Aggravating Factors   bending the knee,  Pain Relieving Factors  sitting resting, medication    Effect of Pain on Daily Activities  limited walking/ standing,          [facility] PT Assessment - 10/05/18 1408      Assessment   Medical Diagnosis  L TKA    Referring Provider (PT)  Frankey Shown MD    Onset Date/Surgical Date  09/12/18    Hand Dominance  Right    Next MD Visit  --   4 weeks   Prior Therapy  yes    for the knee     Precautions   Precautions  None      Restrictions   Weight Bearing Restrictions  No      Balance Screen   Has the patient fallen in the past 6 months  No      [place] residence    Living Arrangements  Spouse/significant other    Available Help at Discharge  Family;Available PRN/intermittently    Type of Home  House    Home Access  Stairs to enter    Entrance Stairs-Number of  Steps  3    Entrance Stairs-Rails  None    Home Layout  One level    [place] - single point;Walker - 2 wheels;Toilet riser   leg lifter     Prior Function   Level of Independence  Independent with basic ADLs    Vocation  On disability      Cognition   Overall Cognitive Status  Within Functional Limits for tasks assessed      Observation/Other Assessments   Lower Extremity Functional Scale   36/80      Observation/Other Assessments-Edema    Edema  Circumferential      Circumferential Edema   Circumferential - Right  @ joint line 46.1 cm, 52.5 cm at 10cm above, 39.2 cm at10cm below     Circumferential - Left   42.3 cm @ joint line      Posture/Postural Control   Posture/Postural Control  Postural limitations    Postural Limitations  Rounded Shoulders;Forward head      ROM / Strength   AROM / PROM / Strength  AROM;PROM;Strength      AROM   AROM Assessment Site  Knee    Right/Left Knee  Right;Left    Right Knee Extension  -13    Right Knee Flexion  88    Left Knee Extension  0    Left Knee Flexion  130      PROM   PROM Assessment Site  Knee    Right/Left Knee  Right    Right Knee Extension  -9    Right Knee Flexion  105      Strength   Strength Assessment Site  Knee    Right/Left Knee  Left;Right    Right Knee Flexion  3+/5    Right Knee Extension  3+/5    Left Knee Flexion  4+/5    Left Knee Extension  4+/5      Palpation   Palpation comment  Numbness along the lateral joint line, tenderness along the distal hamstrings on the R and along the medial joint line      Ambulation/Gait   Ambulation/Gait  Yes    Assistive device  Straight cane    Gait Pattern  Decreased stance time - right;Decreased step length - left;Trendelenburg;Antalgic  Objective measurements completed on examination: See above findings.              PT Education - 10/05/18 1407    Education Details  evaluation findings, POC, goals, HEP with  proper form/ rationale.     Person(s) Educated  Patient    Methods  Explanation;Verbal cues    Comprehension  Verbalized understanding;Verbal cues required       PT Short Term Goals - 10/05/18 1506      PT SHORT TERM GOAL #1   Title  pt to be I with inital HEP     Baseline  only doing HHPT HEP, no other HEP    Time  3    Period  Weeks    Status  New    Target Date  10/26/18      PT SHORT TERM GOAL #2   Title  pt to verbalize and demo techniques to reduce swelling and inflammation via RICE and HEP     Baseline  only takes medication to calm down soreness    Time  3    Period  Weeks    Status  New    Target Date  10/26/18      PT SHORT TERM GOAL #3   Title  reduce R knee edema by >/= 2cm to promote knee ROM and decrease pain to </= 5/10    Baseline  intial 6/10 pain and swelling  @ joint line 46.1 cm, 52.5 cm at 10cm above, 39.2 cm at10cm below     Time  3    Period  Weeks    Status  New    Target Date  10/26/18        PT Long Term Goals - 10/05/18 1509      PT LONG TERM GOAL #1   Title  increase R knee ROM to >/=  5 to 120 degrees with </= 2/10 pain for functional mobility required for gait    Baseline  -13 extension, 88 flexion    Time  6    Period  Weeks    Status  New    Target Date  12/14/18      PT LONG TERM GOAL #2   Title  pt to increase strength in bil LE to >/= 4/5 to provide knee / hip stability with prolonged walking/ standing activities     Baseline   R knee 3+/5 overall strength    Time  6    Period  Weeks    Status  New    Target Date  12/14/18      PT LONG TERM GOAL #3   Title  pt to increase sitting/ standing and walking time to >/= 30 min with </= 2/10 pain for functional endurance required for ADLs     Baseline  able to sit/ stand or walk 15 min    Time  6    Period  Weeks    Status  New    Target Date  12/14/18      PT LONG TERM GOAL #4   Title  pt to be I with all HEP given as of last visit to maintain and progress current level of  function    Baseline  only doing HHPT HEP, no other PT.     Time  6    Period  Weeks    Status  New    Target Date  12/14/18      PT LONG TERM GOAL #5  Title  increase LEFS to >/= 55/80 to demonstrate improvement in function     Baseline  initial score 36/80    Time  6    Period  Weeks    Status  New    Target Date  12/14/18             Plan - 10/05/18 1457    Clinical Impression Statement  pt is a 52 y.o F preseting to OPPT with s/p R knee TKA on 09/12/2018. she demonstrates limited R knee flexion due to pain and swelling in the knee, which is expected following TKA. She demonstrates antalgic step through gait pattern with SPC. She would benefit from physical therapy to decrease R knee pain and swelling, promote funcitonal mobiltiy and gait effiency by addressing the deficits listed.     Clinical Presentation  Stable    Clinical Decision Making  Low    Rehab Potential  Good    PT Frequency  2x / week    PT Duration  --   10weeks   PT Treatment/Interventions  ADLs/Self Care Home Management;Cryotherapy;Electrical Stimulation;Iontophoresis 4mg /ml Dexamethasone;Moist Heat;Ultrasound;Therapeutic activities;Therapeutic exercise;Manual techniques;Taping;Dry needling;Patient/family education;Stair training;Gait training;Vasopneumatic Device    PT Next Visit Plan  review/ update HEP,  R knee ROM, stretching, gait and stair training, vaso for swelling    PT Home Exercise Plan  SLR with quad set, heel slide, sidelying hip abduction, quad set    Consulted and Agree with Plan of Care  Patient       Patient will benefit from skilled therapeutic intervention in order to improve the following deficits and impairments:  Pain, Abnormal gait, Increased fascial restricitons, Decreased strength, Improper body mechanics, Postural dysfunction, Decreased endurance, Decreased balance, Decreased activity tolerance, Decreased range of motion, Increased muscle spasms, Increased edema  Visit  Diagnosis: Chronic pain of right knee  Presence of right artificial knee joint  Stiffness of right knee, not elsewhere classified  Other abnormalities of gait and mobility  Muscle weakness (generalized)     Problem List Patient Active Problem List   Diagnosis Date Noted  . Total knee replacement status 09/12/2018  . Unilateral primary osteoarthritis, right knee 08/11/2018  . Trigger finger of right thumb 02/18/2018  . Right leg pain 08/16/2017  . Tenosynovitis of finger 05/26/2017  . Trigger thumb, left thumb 05/18/2017  . Arthritis of carpometacarpal [facility]) joint of right thumb 03/10/2017  . Essential hypertension 03/04/2017  . Healthcare maintenance 03/04/2017  . Depression 03/09/2016  . Carpal tunnel syndrome 03/09/2016  . Diabetes mellitus, type 2 ([place]) 08/15/2014  . Poor dental hygiene 08/15/2014  . Sciatica 05/31/2012  . OBESITY, UNSPECIFIED 03/07/2010  . TOBACCO ABUSE 03/07/2010  . PAIN IN JOINT, MULTIPLE SITES 03/07/2010   Starr Lake PT, DPT, LAT, ATC  10/05/18  3:15 PM      [facility] [address] [place], [place], [address] Phone: [phone]   Fax:  [phone]  Name: GINELLE BAYS MRN: [id number] Date of Birth: October 20, 1966

## 2018-10-11 ENCOUNTER — Other Ambulatory Visit (INDEPENDENT_AMBULATORY_CARE_PROVIDER_SITE_OTHER): Payer: Self-pay | Admitting: Orthopaedic Surgery

## 2018-10-11 ENCOUNTER — Telehealth (INDEPENDENT_AMBULATORY_CARE_PROVIDER_SITE_OTHER): Payer: Self-pay | Admitting: Orthopaedic Surgery

## 2018-10-11 NOTE — Telephone Encounter (Signed)
Please advise 

## 2018-10-11 NOTE — Telephone Encounter (Signed)
#  20 of oxycodone.  #60 of robaxin

## 2018-10-11 NOTE — Telephone Encounter (Signed)
Patient called requesting an RX refill on her Oxycodone and Robaxin.  Patient uses [facility] on [address].  VU#[phone].  Thank you

## 2018-10-11 NOTE — Telephone Encounter (Signed)
yes

## 2018-10-12 ENCOUNTER — Other Ambulatory Visit (INDEPENDENT_AMBULATORY_CARE_PROVIDER_SITE_OTHER): Payer: Self-pay

## 2018-10-12 ENCOUNTER — Other Ambulatory Visit: Payer: No Typology Code available for payment source

## 2018-10-12 MED ORDER — OXYCODONE-ACETAMINOPHEN 5-325 MG PO TABS: 1.0000 | ORAL_TABLET | Freq: Three times a day (TID) | ORAL | 0 refills | Status: DC | PRN

## 2018-10-12 NOTE — Telephone Encounter (Signed)
Oxycodone ready for pick up. Robaxin sent to pharm already.

## 2018-10-18 ENCOUNTER — Ambulatory Visit: Payer: Medicaid Other | Admitting: Physical Therapy

## 2018-10-21 ENCOUNTER — Ambulatory Visit (INDEPENDENT_AMBULATORY_CARE_PROVIDER_SITE_OTHER): Payer: Medicaid Other

## 2018-10-21 ENCOUNTER — Encounter (INDEPENDENT_AMBULATORY_CARE_PROVIDER_SITE_OTHER): Payer: Self-pay | Admitting: Orthopaedic Surgery

## 2018-10-21 ENCOUNTER — Ambulatory Visit (INDEPENDENT_AMBULATORY_CARE_PROVIDER_SITE_OTHER): Payer: Medicaid Other | Admitting: Physician Assistant

## 2018-10-21 DIAGNOSIS — Z96651 Presence of right artificial knee joint: Secondary | ICD-10-CM

## 2018-10-21 DIAGNOSIS — M25561 Pain in right knee: Secondary | ICD-10-CM

## 2018-10-21 DIAGNOSIS — G8929 Other chronic pain: Secondary | ICD-10-CM

## 2018-10-21 MED ORDER — OXYCODONE-ACETAMINOPHEN 5-325 MG PO TABS: 1.0000 | ORAL_TABLET | Freq: Two times a day (BID) | ORAL | 0 refills | Status: DC | PRN

## 2018-10-21 NOTE — Progress Notes (Signed)
Post-Op Visit Note   Patient: Melanie Phillips           Date of Birth: 03-25-1966           MRN: [id number] Visit Date: 10/21/2018 PCP: Steve Rattler, DO   Assessment & Plan:  Chief Complaint:  Chief Complaint  Patient presents with  . Right Knee - Pain   Visit Diagnoses:  1. Status post right knee replacement   2. Chronic pain of right knee     Plan: Patient is a pleasant 52 year old female presents to our clinic today 39 days status post right total knee replacement, date of surgery 09/12/2018.  She has been doing very well.  The only pain she has is with therapy and then following therapy that night.  She has been taking oxycodone for this with significant relief of symptoms.  Examination of the right knee reveals a well healed surgical incision without evidence of infection.  Calf is soft nontender.  Range of motion 0 to 100 degrees.  Stable to valgus varus stress.  She is neurovascularly intact distally.  At this point, would like for her to continue with formal physical therapy as well as a home exercise program.  She will follow-up with [place] in 7 weeks time when she is 12 weeks out from surgery.  Follow-Up Instructions: Return in about 6 weeks (around 12/02/2018).   Orders:  Orders Placed This Encounter  Procedures  . XR KNEE 3 VIEW RIGHT   Meds ordered this encounter  Medications  . oxyCODONE-acetaminophen (PERCOCET) 5-325 MG tablet    Sig: Take 1-2 tablets by mouth 2 (two) times daily as needed for severe pain.    Dispense:  20 tablet    Refill:  0    Imaging: Xr Knee 3 View Right  Result Date: 10/21/2018 X-rays demonstrate stable alignment of the prosthesis   PMFS History: Patient Active Problem List   Diagnosis Date Noted  . Status post right knee replacement 09/12/2018  . Unilateral primary osteoarthritis, right knee 08/11/2018  . Trigger finger of right thumb 02/18/2018  . Right leg pain 08/16/2017  . Tenosynovitis of finger 05/26/2017  . Trigger  thumb, left thumb 05/18/2017  . Arthritis of carpometacarpal [facility]) joint of right thumb 03/10/2017  . Essential hypertension 03/04/2017  . Healthcare maintenance 03/04/2017  . Depression 03/09/2016  . Carpal tunnel syndrome 03/09/2016  . Diabetes mellitus, type 2 ([place]) 08/15/2014  . Poor dental hygiene 08/15/2014  . Sciatica 05/31/2012  . OBESITY, UNSPECIFIED 03/07/2010  . TOBACCO ABUSE 03/07/2010  . PAIN IN JOINT, MULTIPLE SITES 03/07/2010   Past Medical History:  Diagnosis Date  . Anxiety   . Arthritis   . Back pain   . Depression   . Diabetes mellitus without complication ([place])   . GERD (gastroesophageal reflux disease)   . Hyperlipidemia   . Hypertension     Family History  Problem Relation Age of Onset  . Diabetes Mother   . Diabetes Sister     Past Surgical History:  Procedure Laterality Date  . CARPAL TUNNEL RELEASE Right 03/10/2017   Procedure: CARPAL TUNNEL RELEASE;  Surgeon: Leandrew Koyanagi, MD;  Location: [place];  Service: Orthopedics;  Laterality: Right;  . CARPOMETACARPEL SUSPENSION PLASTY Right 03/10/2017   Procedure: Right thumb Ligament Reconstruction Tendon Interposition, Right Carpal tunnel release;  Surgeon: Leandrew Koyanagi, MD;  Location: [place];  Service: Orthopedics;  Laterality: Right;  . JOINT REPLACEMENT    .  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   Procedure: RIGHT TOTAL KNEE ARTHROPLASTY;  Surgeon: Leandrew Koyanagi, MD;  Location: [place];  Service: Orthopedics;  Laterality: Right;  . TRIGGER FINGER RELEASE Left 05/26/2017   Procedure: RELEASE TRIGGER FINGER LEFT THUMB;  Surgeon: Leandrew Koyanagi, MD;  Location: [place];  Service: Orthopedics;  Laterality: Left;  . TRIGGER FINGER RELEASE Right 02/23/2018   Procedure: RELEASE RIGHT TRIGGER THUMB;  Surgeon: Leandrew Koyanagi, MD;  Location: [place];  Service: Orthopedics;  Laterality: Right;  Bier block  . TUBAL LIGATION     Social History    Occupational History  . Not on file  Tobacco Use  . Smoking status: Current Every Day Smoker    Packs/day: 0.50    Years: 33.00    Pack years: 16.50    Types: Cigarettes  . Smokeless tobacco: Never Used  Substance and Sexual Activity  . Alcohol use: Yes    Alcohol/week: 0.0 standard drinks    Comment: rarely  . Drug use: No  . Sexual activity: Yes    Birth control/protection: Surgical

## 2018-10-24 ENCOUNTER — Encounter: Payer: Self-pay | Admitting: Physical Therapy

## 2018-10-24 ENCOUNTER — Ambulatory Visit: Payer: Medicaid Other | Admitting: Physical Therapy

## 2018-10-24 DIAGNOSIS — G8929 Other chronic pain: Secondary | ICD-10-CM

## 2018-10-24 DIAGNOSIS — M25661 Stiffness of right knee, not elsewhere classified: Secondary | ICD-10-CM

## 2018-10-24 DIAGNOSIS — Z96651 Presence of right artificial knee joint: Secondary | ICD-10-CM

## 2018-10-24 DIAGNOSIS — M25561 Pain in right knee: Secondary | ICD-10-CM

## 2018-10-24 DIAGNOSIS — M6281 Muscle weakness (generalized): Secondary | ICD-10-CM

## 2018-10-24 DIAGNOSIS — R2689 Other abnormalities of gait and mobility: Secondary | ICD-10-CM

## 2018-10-24 NOTE — Patient Instructions (Signed)
Achilles / Gastroc, Standing    Stand, right foot behind, heel on floor and turned slightly out, leg straight, forward leg bent. Move hips forward. Hold _30__ seconds. Repeat _3_ times per session. Do __1 _ sessions per day.  Copyright  VHI. All rights reserved.

## 2018-10-24 NOTE — Therapy (Signed)
[place], [place], [address] Phone: [phone]   Fax:  [phone]  Physical Therapy Treatment  Patient Details  Name: Melanie Phillips MRN: [id number] Date of Birth: 06/28/1966 Referring Provider (PT): Frankey Shown MD   Encounter Date: 10/24/2018  PT End of Session - 10/24/18 1620    Activity Tolerance  Patient tolerated treatment well    Behavior During Therapy  [facility] for tasks assessed/performed       Past Medical History:  Diagnosis Date  . Anxiety   . Arthritis   . Back pain   . Depression   . Diabetes mellitus without complication ([place])   . GERD (gastroesophageal reflux disease)   . Hyperlipidemia   . Hypertension     Past Surgical History:  Procedure Laterality Date  . CARPAL TUNNEL RELEASE Right 03/10/2017   Procedure: CARPAL TUNNEL RELEASE;  Surgeon: Leandrew Koyanagi, MD;  Location: [place];  Service: Orthopedics;  Laterality: Right;  . CARPOMETACARPEL SUSPENSION PLASTY Right 03/10/2017   Procedure: Right thumb Ligament Reconstruction Tendon Interposition, Right Carpal tunnel release;  Surgeon: Leandrew Koyanagi, MD;  Location: [place];  Service: Orthopedics;  Laterality: Right;  . JOINT REPLACEMENT    . 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   Procedure: RIGHT TOTAL KNEE ARTHROPLASTY;  Surgeon: Leandrew Koyanagi, MD;  Location: [place];  Service: Orthopedics;  Laterality: Right;  . TRIGGER FINGER RELEASE Left 05/26/2017   Procedure: RELEASE TRIGGER FINGER LEFT THUMB;  Surgeon: Leandrew Koyanagi, MD;  Location: [place];  Service: Orthopedics;  Laterality: Left;  . TRIGGER FINGER RELEASE Right 02/23/2018   Procedure: RELEASE RIGHT TRIGGER THUMB;  Surgeon: Leandrew Koyanagi, MD;  Location: [place];  Service: Orthopedics;  Laterality: Right;  Bier block  . TUBAL LIGATION      There were no vitals filed for this visit.  Subjective Assessment -  10/24/18 1423    Subjective    Saw MD Friday.  had X ray.  He said she has good motion.  I have been doing the exercises.    Knee keeps her awake.  Able to cook a meal and is able to go to the grocery store.    Uses a cane for step over step.   Cane not used at home usually except first thing the am.    Uses cane in community,  holds cane in shopping basket.     Currently in Pain?  Yes    Pain Score  4     Pain Location  Knee    Pain Orientation  Right    Pain Descriptors / Indicators  Tender;Aching;Sore;Numbness    Pain Type  Surgical pain    Pain Radiating Towards  into lateral lower leg    Aggravating Factors   night    Pain Relieving Factors  muscle relaxers,  pain meds  sitting.   warm shower    Effect of Pain on Daily Activities  limited  sleeping,          [facility] PT Assessment - 10/24/18 0001      AROM   Right Knee Extension  -5    Right Knee Flexion  112                   [facility] Adult PT Treatment/Exercise - 10/24/18 0001      Ambulation/Gait   Gait Comments  cued weight shift, trunk rotation to decrease  limp.      Knee/Hip Exercises: Stretches   Gastroc Stretch  3 reps;30 seconds    Gastroc Stretch Limitations  step, both, and on floor HEP   decreased ROM     Knee/Hip Exercises: Aerobic   Recumbent Bike  3 minutes  full revolution, did not turn on. except last 10 seconds.      Knee/Hip Exercises: Standing   Knee Flexion  10 reps    Forward Step Up  Right;10 reps;Hand Hold: 2;Step Height: 4";Step Height: 6";2 sets    Functional Squat  10 reps    Functional Squat Limitations  cued equal weight shifts,  technique/ position,      Other Standing Knee Exercises  wall slides facing wall cues CGA,  both   gait improved post session.      Knee/Hip Exercises: Seated   Long Arc Quad  Right;1 set;10 reps      Knee/Hip Exercises: Supine   Quad Sets  10 reps    Short Arc [employer]  10 reps    Short Arc Quad Sets Limitations  end range focus able to move patella  after several attempts.     Heel Slides  10 reps    Patellar Mobs  yes      Manual Therapy   Manual Therapy  Soft tissue mobilization;Passive ROM    Manual therapy comments  PROM gentle      Soft tissue mobilization  peri knee, especially medial light pressure    Passive ROM  extension/ flexion.              PT Education - 10/24/18 1454    Education Details  HEP, self care    Person(s) Educated  Patient    Methods  Explanation;Demonstration;Verbal cues;Handout    Comprehension  Returned demonstration;Verbalized understanding       PT Short Term Goals - 10/24/18 1512      PT SHORT TERM GOAL #1   Title  pt to be I with inital HEP     Baseline  exercises all the time.    Time  3    Period  Weeks    Status  On-going      PT SHORT TERM GOAL #2   Title  pt to verbalize and demo techniques to reduce swelling and inflammation via RICE and HEP     Baseline  not using ice,  ed today    Time  3    Period  Weeks    Status  On-going      PT SHORT TERM GOAL #3   Title  reduce R knee edema by >/= 2cm to promote knee ROM and decrease pain to </= 5/10    Time  3    Period  Weeks    Status  Unable to assess        PT Long Term Goals - 10/05/18 1509      PT LONG TERM GOAL #1   Title  increase R knee ROM to >/=  5 to 120 degrees with </= 2/10 pain for functional mobility required for gait    Baseline  -13 extension, 88 flexion    Time  6    Period  Weeks    Status  New    Target Date  12/14/18      PT LONG TERM GOAL #2   Title  pt to increase strength in bil LE to >/= 4/5 to provide knee / hip stability with prolonged walking/ standing  activities     Baseline   R knee 3+/5 overall strength    Time  6    Period  Weeks    Status  New    Target Date  12/14/18      PT LONG TERM GOAL #3   Title  pt to increase sitting/ standing and walking time to >/= 30 min with </= 2/10 pain for functional endurance required for ADLs     Baseline  able to sit/ stand or walk 15 min     Time  6    Period  Weeks    Status  New    Target Date  12/14/18      PT LONG TERM GOAL #4   Title  pt to be I with all HEP given as of last visit to maintain and progress current level of function    Baseline  only doing HHPT HEP, no other PT.     Time  6    Period  Weeks    Status  New    Target Date  12/14/18      PT LONG TERM GOAL #5   Title  increase LEFS to >/= 55/80 to demonstrate improvement in function     Baseline  initial score 36/80    Time  6    Period  Weeks    Status  New    Target Date  12/14/18            Plan - 10/24/18 1508    Clinical Impression Statement  patient is doing well.   AROM-5 to 110.  Able to progress HEP.  Pain at end of session 6/10. She declined the need of modalities of pain.     PT Next Visit Plan  review/ update HEP,  R knee ROM, stretching, gait and stair training, vaso for swelling,  measure edema.    PT Home Exercise Plan  SLR with quad set, heel slide, sidelying hip abduction, quad set, calf stretch    Consulted and Agree with Plan of Care  Patient       Patient will benefit from skilled therapeutic intervention in order to improve the following deficits and impairments:     Visit Diagnosis: Presence of right artificial knee joint  Chronic pain of right knee  Stiffness of right knee, not elsewhere classified  Other abnormalities of gait and mobility  Muscle weakness (generalized)     Problem List Patient Active Problem List   Diagnosis Date Noted  . Status post right knee replacement 09/12/2018  . Unilateral primary osteoarthritis, right knee 08/11/2018  . Trigger finger of right thumb 02/18/2018  . Right leg pain 08/16/2017  . Tenosynovitis of finger 05/26/2017  . Trigger thumb, left thumb 05/18/2017  . Arthritis of carpometacarpal [facility]) joint of right thumb 03/10/2017  . Essential hypertension 03/04/2017  . Healthcare maintenance 03/04/2017  . Depression 03/09/2016  . Carpal tunnel syndrome 03/09/2016  .  Diabetes mellitus, type 2 ([place]) 08/15/2014  . Poor dental hygiene 08/15/2014  . Sciatica 05/31/2012  . OBESITY, UNSPECIFIED 03/07/2010  . TOBACCO ABUSE 03/07/2010  . PAIN IN JOINT, MULTIPLE SITES 03/07/2010    HARRIS,KAREN  PTA 10/24/2018, 4:22 PM  [facility] [address] [place], [place], [address] Phone: [phone]   Fax:  [phone]  Name: Melanie Phillips MRN: [id number] Date of Birth: [birth year]

## 2018-10-26 ENCOUNTER — Encounter: Payer: Medicaid Other | Admitting: Physical Therapy

## 2018-10-26 ENCOUNTER — Ambulatory Visit: Payer: Medicaid Other | Admitting: Physical Therapy

## 2018-11-01 ENCOUNTER — Ambulatory Visit: Payer: Medicaid Other | Admitting: Physical Therapy

## 2018-11-03 ENCOUNTER — Ambulatory Visit: Payer: Medicaid Other | Attending: Orthopaedic Surgery | Admitting: Physical Therapy

## 2018-11-03 ENCOUNTER — Telehealth: Payer: Self-pay | Admitting: Physical Therapy

## 2018-11-03 DIAGNOSIS — M25561 Pain in right knee: Secondary | ICD-10-CM | POA: Insufficient documentation

## 2018-11-03 DIAGNOSIS — G8929 Other chronic pain: Secondary | ICD-10-CM | POA: Insufficient documentation

## 2018-11-03 DIAGNOSIS — Z96651 Presence of right artificial knee joint: Secondary | ICD-10-CM | POA: Insufficient documentation

## 2018-11-03 DIAGNOSIS — M25661 Stiffness of right knee, not elsewhere classified: Secondary | ICD-10-CM | POA: Insufficient documentation

## 2018-11-03 DIAGNOSIS — M6281 Muscle weakness (generalized): Secondary | ICD-10-CM | POA: Insufficient documentation

## 2018-11-03 DIAGNOSIS — R2689 Other abnormalities of gait and mobility: Secondary | ICD-10-CM | POA: Insufficient documentation

## 2018-11-03 NOTE — Telephone Encounter (Signed)
Attempted to call pt regarding missed appointment the number provided was inaccurate.

## 2018-11-08 ENCOUNTER — Ambulatory Visit: Payer: Medicaid Other | Admitting: Physical Therapy

## 2018-11-10 ENCOUNTER — Other Ambulatory Visit (INDEPENDENT_AMBULATORY_CARE_PROVIDER_SITE_OTHER): Payer: Self-pay | Admitting: Orthopaedic Surgery

## 2018-11-10 NOTE — Telephone Encounter (Signed)
yes

## 2018-11-15 ENCOUNTER — Telehealth: Payer: Self-pay | Admitting: Physical Therapy

## 2018-11-15 ENCOUNTER — Ambulatory Visit: Payer: Medicaid Other | Admitting: Physical Therapy

## 2018-11-15 NOTE — Telephone Encounter (Signed)
LVM regarding missed appointment and her next appointment time. If she is unable to make that visit to call an cancel/ reschedule. If she misses that appointment it would be her 3rd missed appointment which per our clinic policy it is grounds for discharge.

## 2018-11-22 ENCOUNTER — Ambulatory Visit: Payer: Medicaid Other | Admitting: Physical Therapy

## 2018-11-22 DIAGNOSIS — M6281 Muscle weakness (generalized): Secondary | ICD-10-CM

## 2018-11-22 DIAGNOSIS — M25661 Stiffness of right knee, not elsewhere classified: Secondary | ICD-10-CM | POA: Diagnosis not present

## 2018-11-22 DIAGNOSIS — Z96651 Presence of right artificial knee joint: Secondary | ICD-10-CM

## 2018-11-22 DIAGNOSIS — M25561 Pain in right knee: Secondary | ICD-10-CM

## 2018-11-22 DIAGNOSIS — R2689 Other abnormalities of gait and mobility: Secondary | ICD-10-CM | POA: Diagnosis not present

## 2018-11-22 DIAGNOSIS — G8929 Other chronic pain: Secondary | ICD-10-CM

## 2018-11-22 NOTE — Therapy (Signed)
[place], [place], [address] Phone: [phone]   Fax:  [phone]  Physical Therapy Treatment/ Re-evaluation/ discharge  Patient Details  Name: Melanie Phillips MRN: [id number] Date of Birth: 05-Oct-1966 Referring Provider (PT): Frankey Shown MD   Encounter Date: 11/22/2018  PT End of Session - 11/22/18 1021    Visit Number  3    Number of Visits  17    Date for PT Re-Evaluation  12/14/18    Authorization Type  MCD    Authorization Time Period  11/17 through 11/30    PT Start Time  1021    PT Stop Time  1048    PT Time Calculation (min)  27 min    Activity Tolerance  Patient tolerated treatment well    Behavior During Therapy  [facility] for tasks assessed/performed       Past Medical History:  Diagnosis Date  . Anxiety   . Arthritis   . Back pain   . Depression   . Diabetes mellitus without complication ([place])   . GERD (gastroesophageal reflux disease)   . Hyperlipidemia   . Hypertension     Past Surgical History:  Procedure Laterality Date  . CARPAL TUNNEL RELEASE Right 03/10/2017   Procedure: CARPAL TUNNEL RELEASE;  Surgeon: Leandrew Koyanagi, MD;  Location: [place];  Service: Orthopedics;  Laterality: Right;  . CARPOMETACARPEL SUSPENSION PLASTY Right 03/10/2017   Procedure: Right thumb Ligament Reconstruction Tendon Interposition, Right Carpal tunnel release;  Surgeon: Leandrew Koyanagi, MD;  Location: [place];  Service: Orthopedics;  Laterality: Right;  . JOINT REPLACEMENT    . 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   Procedure: RIGHT TOTAL KNEE ARTHROPLASTY;  Surgeon: Leandrew Koyanagi, MD;  Location: [place];  Service: Orthopedics;  Laterality: Right;  . TRIGGER FINGER RELEASE Left 05/26/2017   Procedure: RELEASE TRIGGER FINGER LEFT THUMB;  Surgeon: Leandrew Koyanagi, MD;  Location: [place];  Service: Orthopedics;  Laterality: Left;  . TRIGGER FINGER RELEASE  Right 02/23/2018   Procedure: RELEASE RIGHT TRIGGER THUMB;  Surgeon: Leandrew Koyanagi, MD;  Location: [place];  Service: Orthopedics;  Laterality: Right;  Bier block  . TUBAL LIGATION      There were no vitals filed for this visit.  Subjective Assessment - 11/22/18 1021    Subjective  "I am doing pretty good, report of increased stiffness with prolonged standing. but otherwise everything is going fine"     Currently in Pain?  Yes    Pain Score  0-No pain    Pain Orientation  Right    Pain Descriptors / Indicators  Aching    Pain Type  Surgical pain    Pain Onset  More than a month ago    Pain Frequency  Occasional         [facility] PT Assessment - 11/22/18 0001      Assessment   Medical Diagnosis  L TKA    Referring Provider (PT)  Frankey Shown MD    Hand Dominance  Right      Observation/Other Assessments   Lower Extremity Functional Scale   60/80      AROM   Right Knee Extension  -4    Right Knee Flexion  116      PROM   Right Knee Extension  -2    Right Knee Flexion  120      Strength   Right Knee  Flexion  4+/5    Right Knee Extension  4+/5                   [facility] Adult PT Treatment/Exercise - 11/22/18 0001      Knee/Hip Exercises: Standing   Hip Abduction  2 sets;10 reps;Knee straight    Hip Extension  2 sets;10 reps;Knee straight      Knee/Hip Exercises: Seated   Sit to Sand  10 reps;without UE support             PT Education - 11/22/18 1055    Education Details  reviewed previously provided HEP and updated for standing hip strength    Person(s) Educated  Patient    Methods  Explanation;Verbal cues;Handout;Demonstration    Comprehension  Verbalized understanding;Verbal cues required;Returned demonstration       PT Short Term Goals - 11/22/18 1029      PT SHORT TERM GOAL #1   Title  pt to be I with inital HEP     Time  3    Period  Weeks    Status  Achieved      PT SHORT TERM GOAL #2   Title  pt to verbalize and demo  techniques to reduce swelling and inflammation via RICE and HEP     Time  3    Period  Weeks    Status  Achieved      PT SHORT TERM GOAL #3   Title  reduce R knee edema by >/= 2cm to promote knee ROM and decrease pain to </= 5/10    Time  3    Period  Weeks    Status  Achieved        PT Long Term Goals - 11/22/18 1030      PT LONG TERM GOAL #1   Title  increase R knee ROM to >/=  5 to 120 degrees with </= 2/10 pain for functional mobility required for gait    Baseline  -4 to 116 degrees     Time  6    Period  Weeks    Status  Partially Met      PT LONG TERM GOAL #2   Title  pt to increase strength in bil LE to >/= 4/5 to provide knee / hip stability with prolonged walking/ standing activities     Baseline  4+/5    Time  6    Period  Weeks    Status  Achieved      PT LONG TERM GOAL #3   Title  pt to increase sitting/ standing and walking time to >/= 30 min with </= 2/10 pain for functional endurance required for ADLs     Time  6    Period  Weeks    Status  Achieved      PT LONG TERM GOAL #4   Title  pt to be I with all HEP given as of last visit to maintain and progress current level of function    Time  6    Period  Weeks    Status  Achieved      PT LONG TERM GOAL #5   Title  increase LEFS to >/= 55/80 to demonstrate improvement in function     Baseline  60/80    Time  6    Period  Weeks    Status  Achieved            Plan - 11/22/18 1056  Clinical Impression Statement  Melanie Phillips had made great progress with physical therapy increase knee ROM from -4 to 116 degrees and additionally reports no pain or limitations. She met or partially met all goals today, and was able to perform exercise with no limtations. She is able to maintain and progress current level of function independently and will be discharged from PT today.     PT Next Visit Plan  d/c    Consulted and Agree with Plan of Care  Patient       Patient will benefit from skilled therapeutic  intervention in order to improve the following deficits and impairments:  Pain, Abnormal gait, Increased fascial restricitons, Decreased strength, Improper body mechanics, Postural dysfunction, Decreased endurance, Decreased balance, Decreased activity tolerance, Decreased range of motion, Increased muscle spasms, Increased edema  Visit Diagnosis: Presence of right artificial knee joint  Chronic pain of right knee  Stiffness of right knee, not elsewhere classified  Other abnormalities of gait and mobility  Muscle weakness (generalized)     Problem List Patient Active Problem List   Diagnosis Date Noted  . Status post right knee replacement 09/12/2018  . Unilateral primary osteoarthritis, right knee 08/11/2018  . Trigger finger of right thumb 02/18/2018  . Right leg pain 08/16/2017  . Tenosynovitis of finger 05/26/2017  . Trigger thumb, left thumb 05/18/2017  . Arthritis of carpometacarpal [facility]) joint of right thumb 03/10/2017  . Essential hypertension 03/04/2017  . Healthcare maintenance 03/04/2017  . Depression 03/09/2016  . Carpal tunnel syndrome 03/09/2016  . Diabetes mellitus, type 2 ([place]) 08/15/2014  . Poor dental hygiene 08/15/2014  . Sciatica 05/31/2012  . OBESITY, UNSPECIFIED 03/07/2010  . TOBACCO ABUSE 03/07/2010  . PAIN IN JOINT, MULTIPLE SITES 03/07/2010    [place] 11/22/2018, 10:58 AM  [facility] [address] [place], [place], [address] Phone: [phone]   Fax:  [phone]  Name: Melanie Phillips MRN: [id number] Date of Birth: 01/08/1966        PHYSICAL THERAPY DISCHARGE SUMMARY  Visits from Start of Care: 3  Current functional level related to goals / functional outcomes: See goals, LEFS 60/80    Remaining deficits: Report of intermittent stiffness with prolonged walking/ standing. See above assessment    Education / Equipment: HEP, gait training, posture  Plan: Patient  agrees to discharge.  Patient goals were partially met. Patient is being discharged due to being pleased with the current functional level.  ?????          Kristoffer Leamon PT, DPT, LAT, ATC  11/22/18  10:59 AM

## 2018-12-06 ENCOUNTER — Ambulatory Visit (INDEPENDENT_AMBULATORY_CARE_PROVIDER_SITE_OTHER): Payer: Medicaid Other | Admitting: Orthopaedic Surgery

## 2018-12-09 ENCOUNTER — Other Ambulatory Visit: Payer: Self-pay | Admitting: Family Medicine

## 2018-12-09 MED ORDER — LISINOPRIL 5 MG PO TABS: 5.0000 mg | ORAL_TABLET | Freq: Every day | ORAL | 3 refills | Status: DC

## 2018-12-09 NOTE — Telephone Encounter (Signed)
Pt is calling and would like to have her Lisinopril refilled. Pt said that all of her prescriptions now need to be sent to [facility] on [address].

## 2018-12-15 ENCOUNTER — Other Ambulatory Visit: Payer: Self-pay | Admitting: Family Medicine

## 2018-12-20 ENCOUNTER — Ambulatory Visit (INDEPENDENT_AMBULATORY_CARE_PROVIDER_SITE_OTHER): Payer: Medicaid Other | Admitting: Orthopaedic Surgery

## 2019-04-21 ENCOUNTER — Other Ambulatory Visit: Payer: Self-pay | Admitting: Family Medicine

## 2019-04-25 NOTE — Telephone Encounter (Signed)
Patient needs appt for DM follow up has not been here in over a year

## 2019-04-25 NOTE — Telephone Encounter (Signed)
Spoke with daughter and she will have mom give [place] a call back.  Jazmin Hartsell,CMA

## 2019-04-25 NOTE — Telephone Encounter (Signed)
To blue team to call and schedule.  Christen Bame, CMA

## 2019-04-27 ENCOUNTER — Ambulatory Visit (INDEPENDENT_AMBULATORY_CARE_PROVIDER_SITE_OTHER): Payer: Medicaid Other | Admitting: Family Medicine

## 2019-04-27 ENCOUNTER — Other Ambulatory Visit: Payer: Self-pay

## 2019-04-27 ENCOUNTER — Encounter: Payer: Self-pay | Admitting: Family Medicine

## 2019-04-27 VITALS — BP 130/70 | HR 85

## 2019-04-27 DIAGNOSIS — G5732 Lesion of lateral popliteal nerve, left lower limb: Secondary | ICD-10-CM

## 2019-04-27 DIAGNOSIS — I10 Essential (primary) hypertension: Secondary | ICD-10-CM

## 2019-04-27 DIAGNOSIS — E119 Type 2 diabetes mellitus without complications: Secondary | ICD-10-CM | POA: Diagnosis not present

## 2019-04-27 LAB — POCT GLYCOSYLATED HEMOGLOBIN (HGB A1C): HbA1c, POC (controlled diabetic range): 6.6 % (ref 0.0–7.0)

## 2019-04-27 NOTE — Progress Notes (Signed)
    Subjective:    Patient ID: Melanie Phillips, female    DOB: 1966-10-31, 53 y.o.   MRN: [id number]   CC: diabetes, foot numbness  HPI: doing well, coming in for diabetes check up and also c/o numbness of her left foot inbetween first and second toe. This started about 2 months ago out of nowhere. Improves if she massages the area. No pain in her foot. No pain in her ankle or behind knee. No foot drop symptoms. Her knee is bothering her on the left and she was told by ortho she needs knee replacement on that side but wants to hold off on it as long as she can.   DM- taking metformin without any issues. Does not check CBGs. Has local numbness in foot as above. No numbness/tingling in tips of fingers or toes. No increased thirst, hunger, or urination.  HTN- Taking lisinopril 5 mg daily, no chest pain, SOB, leg swelling.  Smoking status reviewed- current smoker  Review of Systems- see HPI   Objective:  BP 130/70   Pulse 85   LMP 06/23/2013   SpO2 98%  Vitals and nursing note reviewed  General: well nourished, in no acute distress HEENT: normocephalic, MMM Neck: supple, non-tender, without lymphadenopathy Cardiac: regular rate Respiratory: no increased work of breathing Extremities: no edema or cyanosis.  Neuro: alert and oriented, no focal deficits. Strength 5/5 in bilateral LE.   Assessment & Plan:    Essential hypertension  Well controlled on 5 mg lisinopril daily, continue current regimen  Diabetes mellitus, type 2  Well controlled, A1C today 6.6, continue current regimen, check A1C q6 months.   Neuropathy of left peroneal nerve  Distribution of numbness between first and second left toe consistent with deep peroneal nerve compression, unclear where this is happening or why- no foot drop or numbness over shin to indicate compression at fibular head, no pain to indicate neuroma. Advised patient follow up with ortho to discuss. Otherwise recommend watchful waiting and if  anything changes or worsen return to be seen.    Return in about 6 months (around 10/28/2019).   Lucila Maine, DO Family Medicine Resident PGY-3

## 2019-04-27 NOTE — Patient Instructions (Signed)
  Great to see you today! Keep up the good work with diabetes and blood pressure.  Please talk to Dr. Erlinda Hong about the numbness inbetween your toes.  We'll see you back in 6 months to check in or sooner if needed  If you have questions or concerns please do not hesitate to call at [phone].

## 2019-04-27 NOTE — Assessment & Plan Note (Signed)
  Well controlled, A1C today 6.6, continue current regimen, check A1C q6 months.

## 2019-04-27 NOTE — Assessment & Plan Note (Addendum)
  Distribution of numbness between first and second left toe consistent with deep peroneal nerve compression, unclear where this is happening or why- no foot drop or numbness over shin to indicate compression at fibular head, no pain to indicate neuroma. Advised patient follow up with ortho to discuss. Otherwise recommend watchful waiting and if anything changes or worsen return to be seen.

## 2019-04-27 NOTE — Assessment & Plan Note (Signed)
  Well controlled on 5 mg lisinopril daily, continue current regimen

## 2019-05-10 IMAGING — CR DG KNEE 1-2V PORT*R*
2 series · 2 of 2 positions shown · non-contrast
Comparison: None.

CLINICAL DATA: Post right knee replacement.  Initial encounter.

EXAM:
PORTABLE RIGHT KNEE - 1-2 VIEW

[AP]
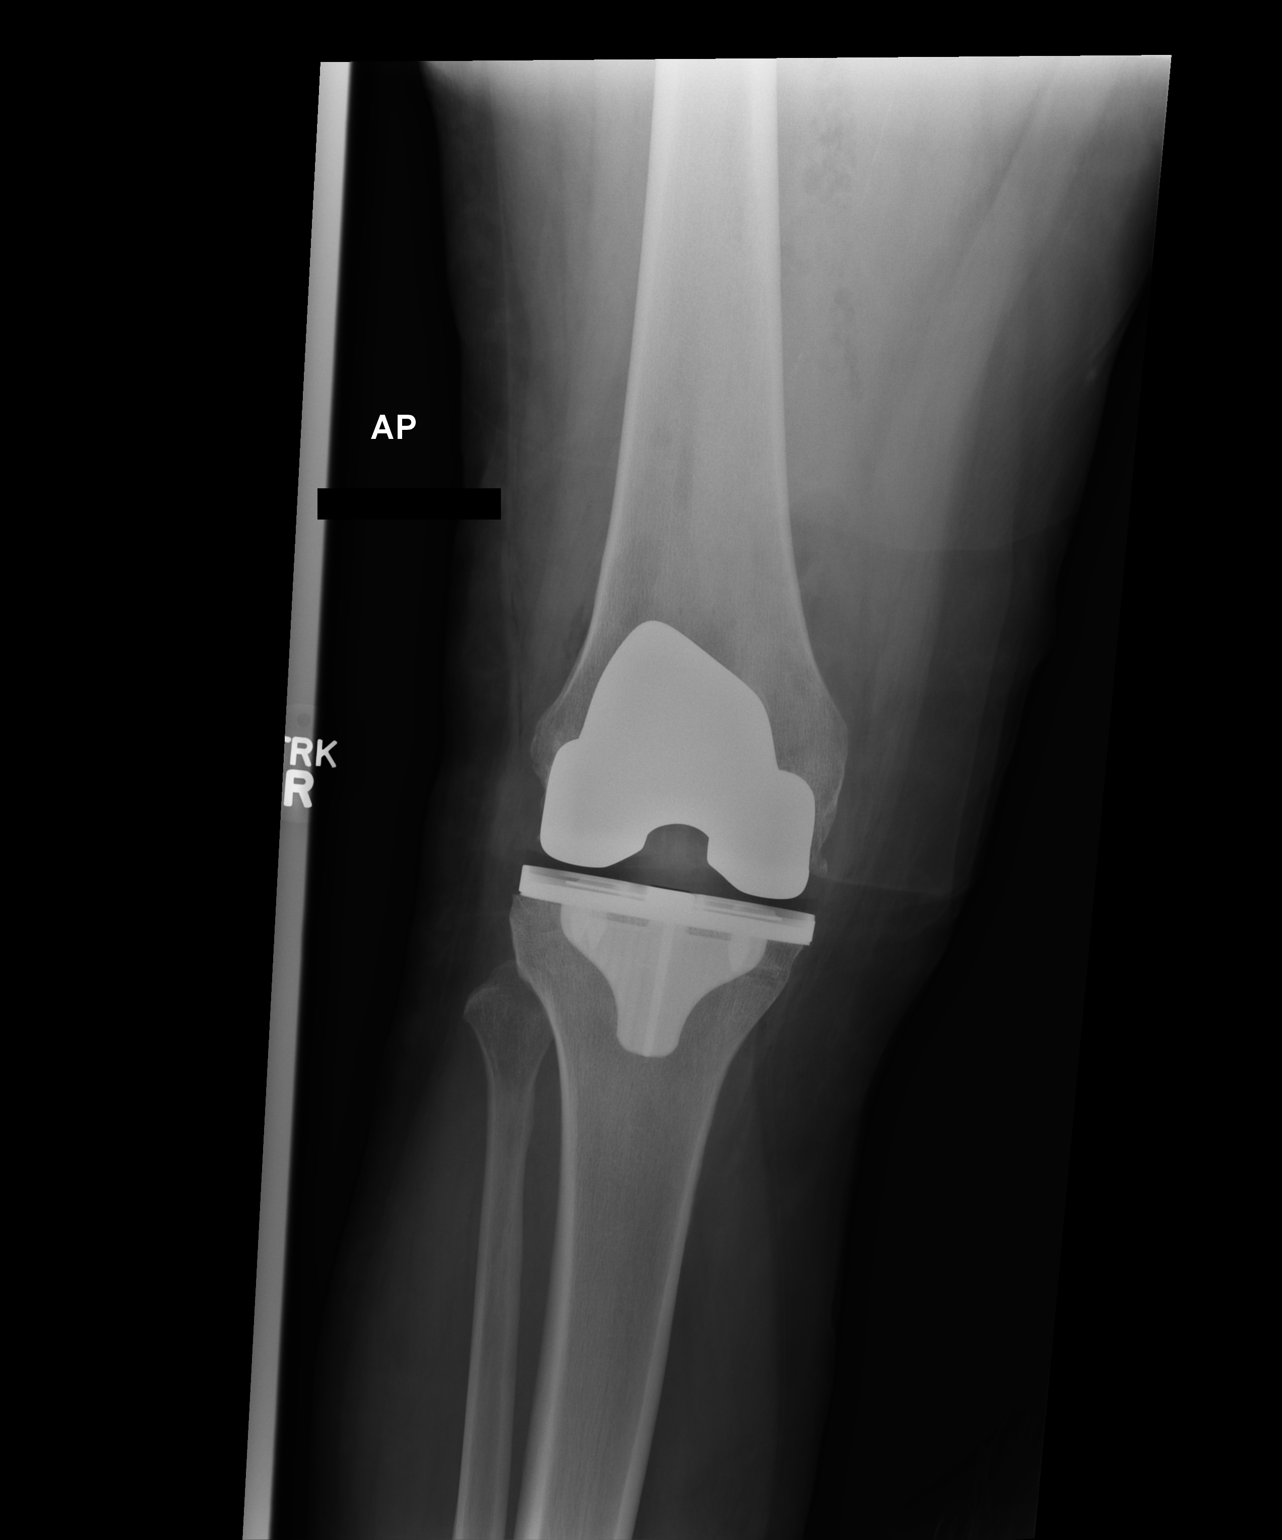

[xtable lateral]
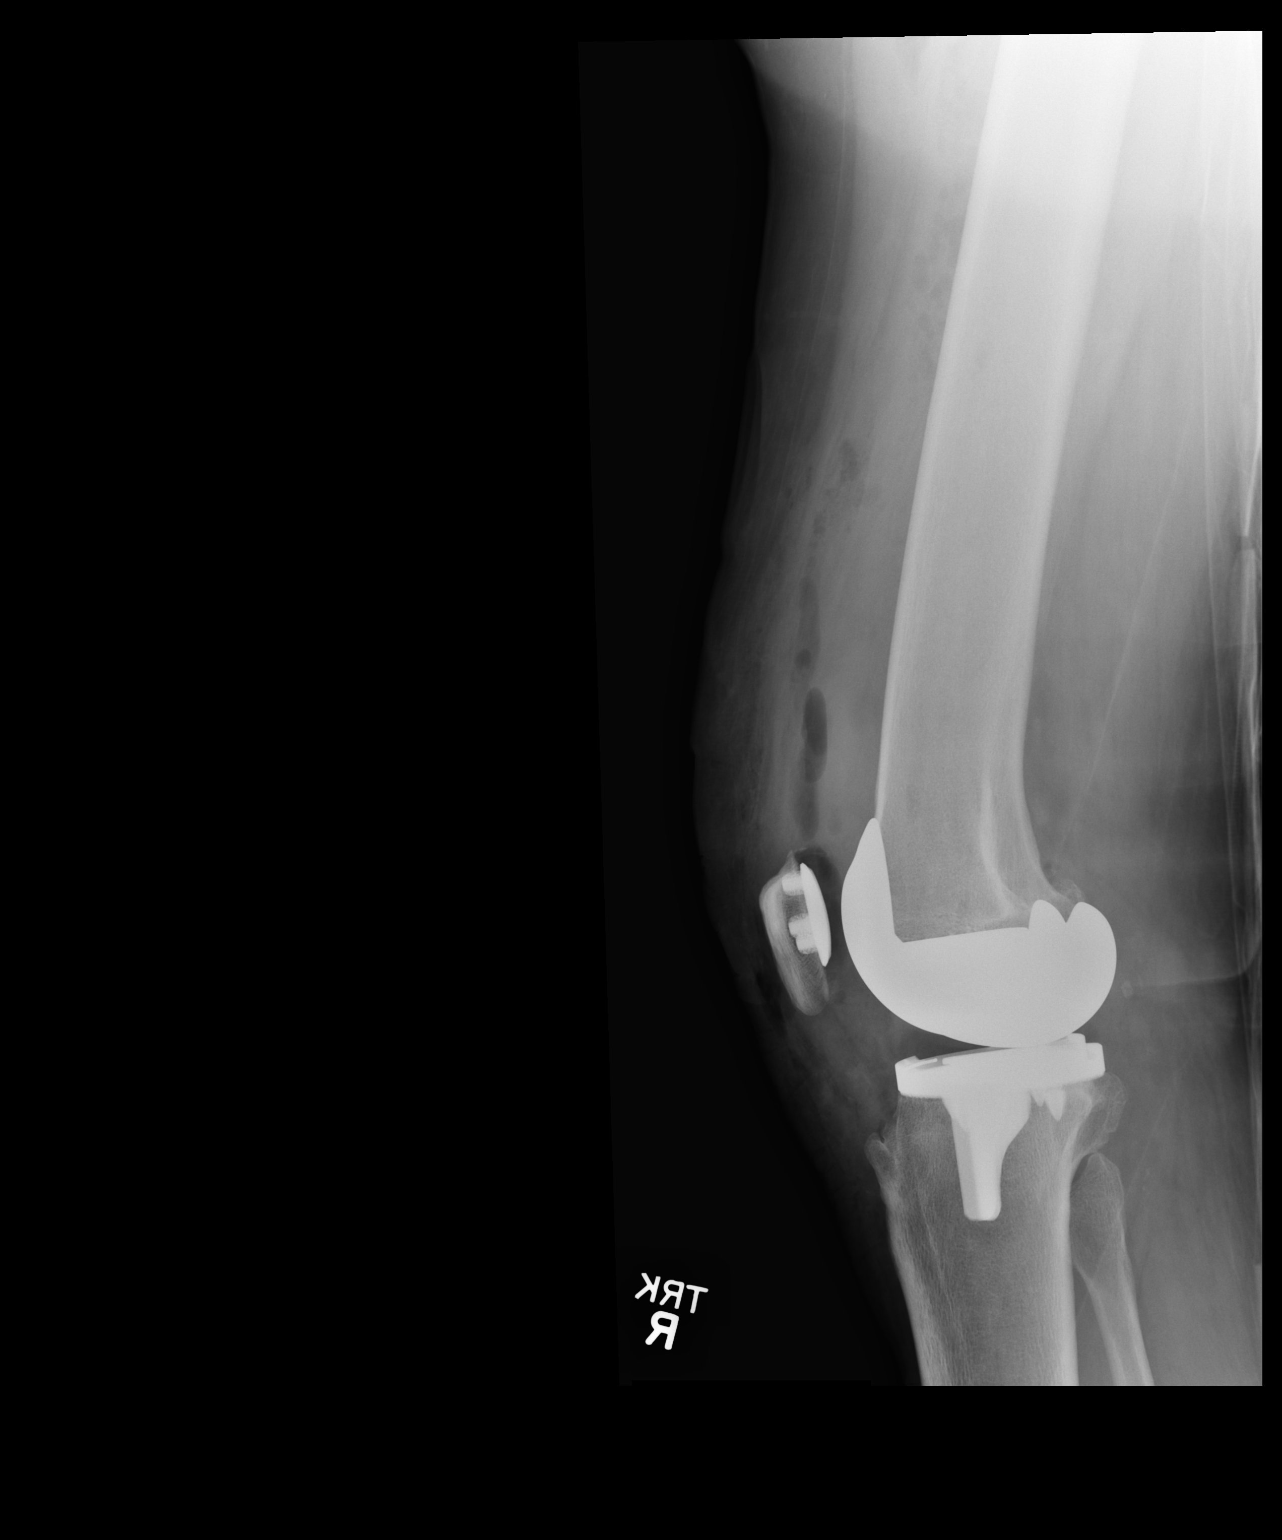

[2 of 2 positions shown; findings below may reference images not displayed]

FINDINGS: Post total right knee replacement which appears in satisfactory
position without complication noted.
IMPRESSION: Post total right knee replacement.

## 2019-06-14 ENCOUNTER — Other Ambulatory Visit: Payer: Self-pay

## 2019-06-14 MED ORDER — METFORMIN HCL 1000 MG PO TABS: 1000.0000 mg | ORAL_TABLET | Freq: Two times a day (BID) | ORAL | 2 refills | Status: DC

## 2019-07-31 ENCOUNTER — Other Ambulatory Visit: Payer: Self-pay

## 2019-07-31 MED ORDER — LISINOPRIL 5 MG PO TABS: 5.0000 mg | ORAL_TABLET | Freq: Every day | ORAL | 1 refills | Status: DC

## 2019-07-31 NOTE — Telephone Encounter (Signed)
appt made for 08-28-2019. Alan Riles,CMA

## 2019-08-28 ENCOUNTER — Other Ambulatory Visit: Payer: Self-pay

## 2019-08-28 ENCOUNTER — Ambulatory Visit (INDEPENDENT_AMBULATORY_CARE_PROVIDER_SITE_OTHER): Payer: Medicaid Other | Admitting: Family Medicine

## 2019-08-28 ENCOUNTER — Encounter: Payer: Self-pay | Admitting: Family Medicine

## 2019-08-28 ENCOUNTER — Encounter: Payer: Self-pay | Admitting: Gastroenterology

## 2019-08-28 VITALS — BP 148/82 | HR 89 | Temp 97.9°F | Wt 262.0 lb

## 2019-08-28 DIAGNOSIS — I10 Essential (primary) hypertension: Secondary | ICD-10-CM | POA: Diagnosis not present

## 2019-08-28 DIAGNOSIS — Z1211 Encounter for screening for malignant neoplasm of colon: Secondary | ICD-10-CM

## 2019-08-28 DIAGNOSIS — E119 Type 2 diabetes mellitus without complications: Secondary | ICD-10-CM | POA: Diagnosis not present

## 2019-08-28 DIAGNOSIS — F17200 Nicotine dependence, unspecified, uncomplicated: Secondary | ICD-10-CM

## 2019-08-28 DIAGNOSIS — Z23 Encounter for immunization: Secondary | ICD-10-CM | POA: Diagnosis not present

## 2019-08-28 LAB — POCT GLYCOSYLATED HEMOGLOBIN (HGB A1C): HbA1c, POC (controlled diabetic range): 6.8 % (ref 0.0–7.0)

## 2019-08-28 MED ORDER — ATORVASTATIN CALCIUM 20 MG PO TABS: 20.0000 mg | ORAL_TABLET | Freq: Every day | ORAL | 3 refills | Status: DC

## 2019-08-28 MED ORDER — OMEPRAZOLE 40 MG PO CPDR: 40.0000 mg | DELAYED_RELEASE_CAPSULE | Freq: Every day | ORAL | 2 refills | Status: DC

## 2019-08-28 MED ORDER — METHOCARBAMOL 750 MG PO TABS: 750.0000 mg | ORAL_TABLET | Freq: Three times a day (TID) | ORAL | 0 refills | Status: DC | PRN

## 2019-08-28 MED ORDER — NICOTINE 14 MG/24HR TD PT24: 14.0000 mg | MEDICATED_PATCH | Freq: Every day | TRANSDERMAL | 0 refills | Status: DC

## 2019-08-28 MED ORDER — LISINOPRIL 10 MG PO TABS: 10.0000 mg | ORAL_TABLET | Freq: Every day | ORAL | 3 refills | Status: DC

## 2019-08-28 NOTE — Patient Instructions (Addendum)
Thank you for coming to see me today. It was a pleasure! Today we talked about:   Your A1c is well controlled at 6.8 today.  Keep up the good work!  I have restarted you on atorvastatin 20 mg for you to take daily.  This medication is to help reduce your risk of a cardiovascular event. For your high blood pressure we are going to increase her lisinopril to 10 mg/day.  You can take the rest of your 5 mg pills (these take 2) at home. I have sent in a new prescription once you are through with those.  I have refilled your Robaxin, omeprazole. We will call you with your lab results.  I have sent in nicotine patches for you to try to stop smoking.  May also call 1-800-QUIT-NOW for further support.  Please follow-up with me in 6 months or sooner as needed.  If you have any questions or concerns, please do not hesitate to call the office at [phone].  Take Care,   Martinique Levaeh Vice, DO

## 2019-08-28 NOTE — Progress Notes (Signed)
  Subjective:  Patient ID: Melanie Phillips  DOB: 28-Nov-1966 MRN: [id number]  Melanie Phillips is a 53 y.o. female with a PMH of HTN, T2DM, OA, tobacco use disorder, here today for f/u diabetes.   HPI:  Diabetes: Disease Monitoring: - Blood Sugar ranges: 95-150 - Medications: metformin, Compliant: yes  - On Aspirin, and will restrat statin - Last eye exam: referred today - Last foot exam: up to date ROS: denies hypoglycemic sx, dizziness, diaphoresis, LOC, polyuria, polydipsia  - Last A1C:  Lab Results  Component Value Date   HGBA1C 6.8 08/28/2019   Hypertension: - Medications: lisinopril 5mg  - Compliance: yes - Checking BP at home: occasioanlly - Denies any SOB, CP, vision changes, LE edema, medication SEs, or symptoms of hypotension - Diet: tried to eat healthy - Exercise: walks when she can  Tobacco use disorder - smokes 5-7 cigarettes per day and is interested in quitting.   ROS: as mentioned in HPI  Family hx: Cardiovascular disease, Diabetes   Social hx: Denies use of illicit drugs, alcohol use Smoking status reviewed- smokes 5-7 cigarettes/day  Patient Active Problem List   Diagnosis Date Noted  . 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 Status post right knee replacement 09/12/2018  . Unilateral primary osteoarthritis, right knee 08/11/2018  . Tenosynovitis of finger 05/26/2017  . Arthritis of carpometacarpal [facility]) joint of right thumb 03/10/2017  . Essential hypertension 03/04/2017  . Depression 03/09/2016  . Carpal tunnel syndrome 03/09/2016  . Diabetes mellitus, type 2 ([place]) 08/15/2014  . Poor dental hygiene 08/15/2014  . Sciatica 05/31/2012  . OBESITY, UNSPECIFIED 03/07/2010  . TOBACCO ABUSE 03/07/2010  . PAIN IN JOINT, MULTIPLE SITES 03/07/2010     Objective:  BP (!) 148/82   Pulse 89   Temp 97.9 F (36.6 C) (Oral)   Wt 262 lb (118.8 kg)   LMP 06/23/2013   SpO2 97%   BMI 38.69 kg/m   Vitals and nursing note reviewed  General: NAD,  pleasant Cardiac: RRR, normal heart sounds, no m/r/g Pulm: normal effort, CTAB Extremities: no edema or cyanosis. WWP. Skin: warm and dry, no rashes noted Neuro: alert and oriented, no focal deficits Psych: normal affect, normal thought content  Assessment & Plan:   TOBACCO ABUSE Patient would like to try patches. Given 1800QUITNOW resource and is to reach out if patches fail.  Essential hypertension BP 148/82, will increase her lisinopril to 10 mg/day. Patient to check outpatient as she does regularly  Diabetes mellitus, type 2 A1c well-controlled. Restarted statin therapy. Continue current medications. BMP wnl.   Health Maintenance: Colonoscopy info and FIT testing given for patient to decide which to do Referral to ophthalmology   Flu vaccine given today  [place] Delcenia Inman, DO Family Medicine Resident PGY-3

## 2019-08-29 LAB — BASIC METABOLIC PANEL
BUN/Creatinine Ratio: 13 (ref 9–23)
BUN: 10 mg/dL (ref 6–24)
CO2: 23 mmol/L (ref 20–29)
Calcium: 10.2 mg/dL (ref 8.7–10.2)
Chloride: 104 mmol/L (ref 96–106)
Creatinine, Ser: 0.75 mg/dL (ref 0.57–1.00)
GFR calc Af Amer: 105 mL/min/{1.73_m2} (ref >59)
GFR calc non Af Amer: 91 mL/min/{1.73_m2} (ref >59)
Glucose: 84 mg/dL (ref 65–99)
Potassium: 4.2 mmol/L (ref 3.5–5.2)
Sodium: 142 mmol/L (ref 134–144)

## 2019-08-29 LAB — CBC
Hematocrit: 42.3 % (ref 34.0–46.6)
Hemoglobin: 14.5 g/dL (ref 11.1–15.9)
MCH: 30.6 pg (ref 26.6–33.0)
MCHC: 34.3 g/dL (ref 31.5–35.7)
MCV: 89 fL (ref 79–97)
Platelets: 391 10*3/uL (ref 150–450)
RBC: 4.74 x10E6/uL (ref 3.77–5.28)
RDW: 13.7 % (ref 11.7–15.4)
WBC: 12 10*3/uL — ABNORMAL HIGH (ref 3.4–10.8)

## 2019-08-31 NOTE — Assessment & Plan Note (Signed)
Patient would like to try patches. Given 1800QUITNOW resource and is to reach out if patches fail.

## 2019-08-31 NOTE — Assessment & Plan Note (Signed)
A1c well-controlled. Restarted statin therapy. Continue current medications. BMP wnl.

## 2019-08-31 NOTE — Assessment & Plan Note (Signed)
BP 148/82, will increase her lisinopril to 10 mg/day. Patient to check outpatient as she does regularly

## 2019-09-12 ENCOUNTER — Encounter: Payer: Self-pay | Admitting: Gastroenterology

## 2019-09-26 ENCOUNTER — Encounter: Payer: Medicaid Other | Admitting: Gastroenterology

## 2019-10-05 ENCOUNTER — Other Ambulatory Visit: Payer: Self-pay

## 2019-10-05 ENCOUNTER — Ambulatory Visit (AMBULATORY_SURGERY_CENTER): Payer: Medicaid Other | Admitting: *Deleted

## 2019-10-05 VITALS — Temp 96.6°F | Ht 68.5 in | Wt 266.0 lb

## 2019-10-05 DIAGNOSIS — Z1211 Encounter for screening for malignant neoplasm of colon: Secondary | ICD-10-CM

## 2019-10-05 DIAGNOSIS — Z1159 Encounter for screening for other viral diseases: Secondary | ICD-10-CM

## 2019-10-05 MED ORDER — SUPREP BOWEL PREP KIT 17.5-3.13-1.6 GM/177ML PO SOLN: 1.0000 | Freq: Once | ORAL | 0 refills | Status: AC

## 2019-10-05 NOTE — Progress Notes (Signed)
No egg or soy allergy known to patient  No issues with past sedation with any surgeries  or procedures, no intubation problems  No diet pills per patient No home 02 use per patient  No blood thinners per patient  Pt denies issues with constipation  No A fib or A flutter  EMMI video sent to pt's e mail   covid test 11-16 Monday at 1110 am   Due to the COVID-19 pandemic we are asking patients to follow these guidelines. Please only bring one care partner. Please be aware that your care partner may wait in the car in the parking lot or if they feel like they will be too hot to wait in the car, they may wait in the lobby on the 4th floor. All care partners are required to wear a mask the entire time (we do not have any that we can provide them), they need to practice social distancing, and we will do a Covid check for all patient's and care partners when you arrive. Also we will check their temperature and your temperature. If the care partner waits in their car they need to stay in the parking lot the entire time and we will call them on their cell phone when the patient is ready for discharge so they can bring the car to the front of the building. Also all patient's will need to wear a mask into building.

## 2019-10-13 DIAGNOSIS — R61 Generalized hyperhidrosis: Secondary | ICD-10-CM | POA: Diagnosis not present

## 2019-10-13 DIAGNOSIS — R531 Weakness: Secondary | ICD-10-CM | POA: Diagnosis not present

## 2019-10-13 DIAGNOSIS — R55 Syncope and collapse: Secondary | ICD-10-CM | POA: Diagnosis not present

## 2019-10-13 DIAGNOSIS — R42 Dizziness and giddiness: Secondary | ICD-10-CM | POA: Diagnosis not present

## 2019-10-13 DIAGNOSIS — R110 Nausea: Secondary | ICD-10-CM | POA: Diagnosis not present

## 2019-10-17 ENCOUNTER — Other Ambulatory Visit: Payer: Self-pay | Admitting: Family Medicine

## 2019-10-17 ENCOUNTER — Telehealth: Payer: Self-pay | Admitting: *Deleted

## 2019-10-17 ENCOUNTER — Other Ambulatory Visit: Payer: Self-pay | Admitting: Gastroenterology

## 2019-10-17 DIAGNOSIS — Z03818 Encounter for observation for suspected exposure to other biological agents ruled out: Secondary | ICD-10-CM | POA: Diagnosis not present

## 2019-10-17 NOTE — Telephone Encounter (Signed)
Pt had covid test yesterday 11/16 at 11:10 but results werent in. Called the patient and she said that she was going today at 3:30. Procedure is scheduled for Thursday with Dr. Tarri Glenn.

## 2019-10-18 LAB — SARS CORONAVIRUS 2 (TAT 6-24 HRS): SARS Coronavirus 2: NEGATIVE

## 2019-10-19 ENCOUNTER — Encounter: Payer: Self-pay | Admitting: Gastroenterology

## 2019-10-19 ENCOUNTER — Other Ambulatory Visit: Payer: Self-pay

## 2019-10-19 ENCOUNTER — Ambulatory Visit (AMBULATORY_SURGERY_CENTER): Payer: Medicaid Other | Admitting: Gastroenterology

## 2019-10-19 VITALS — BP 126/91 | HR 72 | Temp 97.8°F | Resp 19 | Ht 69.0 in | Wt 266.0 lb

## 2019-10-19 DIAGNOSIS — D123 Benign neoplasm of transverse colon: Secondary | ICD-10-CM

## 2019-10-19 DIAGNOSIS — I10 Essential (primary) hypertension: Secondary | ICD-10-CM | POA: Diagnosis not present

## 2019-10-19 DIAGNOSIS — D121 Benign neoplasm of appendix: Secondary | ICD-10-CM

## 2019-10-19 DIAGNOSIS — D120 Benign neoplasm of cecum: Secondary | ICD-10-CM

## 2019-10-19 DIAGNOSIS — Z1211 Encounter for screening for malignant neoplasm of colon: Secondary | ICD-10-CM | POA: Diagnosis not present

## 2019-10-19 DIAGNOSIS — D122 Benign neoplasm of ascending colon: Secondary | ICD-10-CM | POA: Diagnosis not present

## 2019-10-19 DIAGNOSIS — D125 Benign neoplasm of sigmoid colon: Secondary | ICD-10-CM

## 2019-10-19 DIAGNOSIS — E119 Type 2 diabetes mellitus without complications: Secondary | ICD-10-CM | POA: Diagnosis not present

## 2019-10-19 MED ORDER — SODIUM CHLORIDE 0.9 % IV SOLN: 500.0000 mL | Freq: Once | INTRAVENOUS | Status: DC

## 2019-10-19 NOTE — Progress Notes (Signed)
Called to room to assist during endoscopic procedure.  Patient ID and intended procedure confirmed with present staff. Received instructions for my participation in the procedure from the performing physician.  

## 2019-10-19 NOTE — Op Note (Signed)
[place] Patient Name: Melanie Phillips Procedure Date: 10/19/2019 10:39 AM MRN: [id number] Endoscopist: Thornton Park MD, MD Age: 53 Referring MD:  Date of Birth: 1966-03-16 Gender: Female Account #: [id number] Procedure:                Colonoscopy Indications:              Screening for colorectal malignant neoplasm, This                            is the patient's first colonoscopy                           No known family history of colon cancer or polyps                           No baseline GI symptoms Medicines:                Monitored Anesthesia Care Procedure:                Pre-Anesthesia Assessment:                           - Prior to the procedure, a History and Physical                            was performed, and patient medications and                            allergies were reviewed. The patient's tolerance of                            previous anesthesia was also reviewed. The risks                            and benefits of the procedure and the sedation                            options and risks were discussed with the patient.                            All questions were answered, and informed consent                            was obtained. Prior Anticoagulants: The patient has                            taken no previous anticoagulant or antiplatelet                            agents. ASA Grade Assessment: I - A normal, healthy                            patient. After reviewing the risks and benefits,  the patient was deemed in satisfactory condition to                            undergo the procedure.                           After obtaining informed consent, the colonoscope                            was passed under direct vision. Throughout the                            procedure, the patient's blood pressure, pulse, and                            oxygen saturations were monitored continuously. The                     Colonoscope was introduced through the anus and                            advanced to the the terminal ileum, with                            identification of the appendiceal orifice and IC                            valve. A second forward view of the right colon was                            performed. The colonoscopy was performed without                            difficulty. The patient tolerated the procedure                            well. The quality of the bowel preparation was                            good. The terminal ileum, ileocecal valve,                            appendiceal orifice, and rectum were photographed. Scope In: 10:48:08 AM Scope Out: 11:03:05 AM Scope Withdrawal Time: 0 hours 11 minutes 49 seconds  Total Procedure Duration: 0 hours 14 minutes 57 seconds  Findings:                 The perianal and digital rectal examinations were                            normal.                           Four sessile polyps were found in the sigmoid  colon, hepatic flexure, ascending colon and                            appendiceal orifice. The polyps were 1 to 3 mm in                            size. These polyps were removed with a cold snare.                            Resection and retrieval were complete. Estimated                            blood loss was minimal.                           The exam was otherwise without abnormality on                            direct and retroflexion views except for small,                            non-bleeding internal hemorrhoids. Complications:            No immediate complications. Estimated blood loss:                            Minimal. Estimated Blood Loss:     Estimated blood loss was minimal. Impression:               - Four 1 to 3 mm polyps in the sigmoid colon, at                            the hepatic flexure, in the ascending colon and at                            the  appendiceal orifice, removed with a cold snare.                            Resected and retrieved.                           - Small internal hemorrhoids.                           - The examination was otherwise normal on direct                            and retroflexion views. Recommendation:           - Patient has a contact number available for                            emergencies. The signs and symptoms of potential  delayed complications were discussed with the                            patient. Return to normal activities tomorrow.                            Written discharge instructions were provided to the                            patient.                           - Resume previous diet.                           - Continue present medications.                           - Await pathology results.                           - Repeat colonoscopy date to be determined after                            pending pathology results are reviewed for                            surveillance based on pathology results. Thornton Park MD, MD 10/19/2019 11:09:33 AM This report has been signed electronically.

## 2019-10-19 NOTE — Patient Instructions (Signed)
Handout on polyps given   YOU HAD AN ENDOSCOPIC PROCEDURE TODAY AT THE [facility]:   Refer to the procedure report that was given to you for any specific questions about what was found during the examination.  If the procedure report does not answer your questions, please call your gastroenterologist to clarify.  If you requested that your care partner not be given the details of your procedure findings, then the procedure report has been included in a sealed envelope for you to review at your convenience later.  YOU SHOULD EXPECT: Some feelings of bloating in the abdomen. Passage of more gas than usual.  Walking can help get rid of the air that was put into your GI tract during the procedure and reduce the bloating. If you had a lower endoscopy (such as a colonoscopy or flexible sigmoidoscopy) you may notice spotting of blood in your stool or on the toilet paper. If you underwent a bowel prep for your procedure, you may not have a normal bowel movement for a few days.  Please Note:  You might notice some irritation and congestion in your nose or some drainage.  This is from the oxygen used during your procedure.  There is no need for concern and it should clear up in a day or so.  SYMPTOMS TO REPORT IMMEDIATELY:   Following lower endoscopy (colonoscopy or flexible sigmoidoscopy):  Excessive amounts of blood in the stool  Significant tenderness or worsening of abdominal pains  Swelling of the abdomen that is new, acute  Fever of 100F or higher   For urgent or emergent issues, a gastroenterologist can be reached at any hour by calling [phone].   DIET:  We do recommend a small meal at first, but then you may proceed to your regular diet.  Drink plenty of fluids but you should avoid alcoholic beverages for 24 hours.  ACTIVITY:  You should plan to take it easy for the rest of today and you should NOT DRIVE or use heavy machinery until tomorrow (because of the sedation  medicines used during the test).    FOLLOW UP: Our staff will call the number listed on your records 48-72 hours following your procedure to check on you and address any questions or concerns that you may have regarding the information given to you following your procedure. If we do not reach you, we will leave a message.  We will attempt to reach you two times.  During this call, we will ask if you have developed any symptoms of COVID 19. If you develop any symptoms (ie: fever, flu-like symptoms, shortness of breath, cough etc.) before then, please call [phone].  If you test positive for Covid 19 in the 2 weeks post procedure, please call and report this information to us.    If any biopsies were taken you will be contacted by phone or by letter within the next 1-3 weeks.  Please call us at [phone] if you have not heard about the biopsies in 3 weeks.    SIGNATURES/CONFIDENTIALITY: You and/or your care partner have signed paperwork which will be entered into your electronic medical record.  These signatures attest to the fact that that the information above on your After Visit Summary has been reviewed and is understood.  Full responsibility of the confidentiality of this discharge information lies with you and/or your care-partner. 

## 2019-10-19 NOTE — Progress Notes (Signed)
Pt's states no medical or surgical changes since previsit or office visit. 

## 2019-10-19 NOTE — Progress Notes (Signed)
Report given to PACU, vss 

## 2019-10-23 ENCOUNTER — Telehealth: Payer: Self-pay

## 2019-10-23 NOTE — Telephone Encounter (Signed)
  Follow up Call-  Call back number 10/19/2019  Post procedure Call Back phone  # [phone]  Permission to leave phone message Yes  Some recent data might be hidden     Patient questions:  Do you have a fever, pain , or abdominal swelling? No. Pain Score  0 *  Have you tolerated food without any problems? Yes.    Have you been able to return to your normal activities? Yes.    Do you have any questions about your discharge instructions: Diet   No. Medications  No. Follow up visit  No.  Do you have questions or concerns about your Care? No.  Actions: * If pain score is 4 or above: No action needed, pain <4. 1. Have you developed a fever since your procedure? no  2.   Have you had an respiratory symptoms (SOB or cough) since your procedure? no  3.   Have you tested positive for COVID 19 since your procedure no  4.   Have you had any family members/close contacts diagnosed with the COVID 19 since your procedure?  no   If yes to any of these questions please route to Joylene John, RN and Alphonsa Gin, Therapist, sports.

## 2019-10-24 ENCOUNTER — Encounter: Payer: Self-pay | Admitting: Gastroenterology

## 2019-10-30 ENCOUNTER — Ambulatory Visit
Admission: EM | Admit: 2019-10-30 | Discharge: 2019-10-30 | Disposition: A | Discharge status: Home / Self Care | Payer: Medicaid Other

## 2019-10-30 ENCOUNTER — Encounter: Payer: Self-pay | Admitting: Emergency Medicine

## 2019-10-30 ENCOUNTER — Other Ambulatory Visit: Payer: Self-pay

## 2019-10-30 DIAGNOSIS — M79675 Pain in left toe(s): Secondary | ICD-10-CM | POA: Diagnosis not present

## 2019-10-30 DIAGNOSIS — I10 Essential (primary) hypertension: Secondary | ICD-10-CM

## 2019-10-30 NOTE — Discharge Instructions (Signed)
Recommend RICE: rest, ice, compression, elevation as needed for pain.    Heat therapy (hot compress, warm wash red, hot showers, etc.) can help relax muscles and soothe muscle aches. Cold therapy (ice packs) can be used to help swelling both after injury and after prolonged use of areas of chronic pain/aches.  For pain: recommend 350 mg-1000 mg of Tylenol (acetaminophen) and/or 200 mg - 800 mg of Advil (ibuprofen, Motrin) every 8 hours as needed.  May alternate between the two throughout the day as they are generally safe to take together.  DO NOT exceed more than 3000 mg of Tylenol or 3200 mg of ibuprofen in a 24 hour period as this could damage your stomach, kidneys, liver, or increase your bleeding risk.   Return for worsening pain, swelling, redness, open wound, discharge, difficulty walking, fever.

## 2019-10-30 NOTE — ED Triage Notes (Signed)
Pt presents to [facility] for assessment after noticing swelling to the second toe of her left foot this morning

## 2019-10-30 NOTE — ED Notes (Signed)
Patient able to ambulate independently  

## 2019-10-30 NOTE — ED Provider Notes (Signed)
[facility]    CSN: [id number] Arrival date & time: 10/30/19  1954      History   Chief Complaint Chief Complaint  Patient presents with  . Foot Swelling    HPI Melanie Phillips is a 53 y.o. female with history diabetes, hypertension presenting for left second toe pain, swelling since this morning.  Patient denies trauma to the area, inciting event.  No redness, warmth, open wound, discharge.  Has not tried anything for this.   Past Medical History:  Diagnosis Date  . Anxiety   . Arthritis   . Back pain   . Depression   . Diabetes mellitus without complication ([place])   . GERD (gastroesophageal reflux disease)   . Hyperlipidemia   . Hypertension     Patient Active Problem List   Diagnosis Date Noted  . 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 Status post right knee replacement 09/12/2018  . Unilateral primary osteoarthritis, right knee 08/11/2018  . Tenosynovitis of finger 05/26/2017  . Arthritis of carpometacarpal [facility]) joint of right thumb 03/10/2017  . Essential hypertension 03/04/2017  . Depression 03/09/2016  . Carpal tunnel syndrome 03/09/2016  . Diabetes mellitus, type 2 ([place]) 08/15/2014  . Poor dental hygiene 08/15/2014  . Sciatica 05/31/2012  . OBESITY, UNSPECIFIED 03/07/2010  . TOBACCO ABUSE 03/07/2010  . PAIN IN JOINT, MULTIPLE SITES 03/07/2010    Past Surgical History:  Procedure Laterality Date  . CARPAL TUNNEL RELEASE Right 03/10/2017   Procedure: CARPAL TUNNEL RELEASE;  Surgeon: Leandrew Koyanagi, MD;  Location: [place];  Service: Orthopedics;  Laterality: Right;  . CARPOMETACARPEL SUSPENSION PLASTY Right 03/10/2017   Procedure: Right thumb Ligament Reconstruction Tendon Interposition, Right Carpal tunnel release;  Surgeon: Leandrew Koyanagi, MD;  Location: [place];  Service: Orthopedics;  Laterality: Right;  . JOINT REPLACEMENT    . 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   Procedure: RIGHT TOTAL  KNEE ARTHROPLASTY;  Surgeon: Leandrew Koyanagi, MD;  Location: [place];  Service: Orthopedics;  Laterality: Right;  . TRIGGER FINGER RELEASE Left 05/26/2017   Procedure: RELEASE TRIGGER FINGER LEFT THUMB;  Surgeon: Leandrew Koyanagi, MD;  Location: [place];  Service: Orthopedics;  Laterality: Left;  . TRIGGER FINGER RELEASE Right 02/23/2018   Procedure: RELEASE RIGHT TRIGGER THUMB;  Surgeon: Leandrew Koyanagi, MD;  Location: [place];  Service: Orthopedics;  Laterality: Right;  Bier block  . TUBAL LIGATION      OB History    Gravida  6   Para      Term      Preterm      AB      Living  6     SAB      TAB      Ectopic      Multiple      Live Births               Home Medications    Prior to Admission medications   Medication Sig Start Date End Date Taking? Authorizing Provider  ACCU-CHEK FASTCLIX LANCETS MISC Use as needed to check blood sugar. 03/30/18   Steve Rattler, DO  aspirin EC 81 MG tablet Take 1 tablet (81 mg total) by mouth daily. Patient taking differently: Take 81 mg by mouth every evening.  08/15/14   Katheren Shams, DO  atorvastatin (LIPITOR) 20 MG tablet Take 1 tablet (20 mg total) by mouth daily. 08/28/19  Enid Derry, [place], DO  glucose blood (ACCU-CHEK SMARTVIEW) test strip Use as instructed 03/30/18   Lucila Maine C, DO  lisinopril (ZESTRIL) 10 MG tablet Take 1 tablet (10 mg total) by mouth at bedtime. 08/28/19   Shirley, [place], DO  metFORMIN (GLUCOPHAGE) 1000 MG tablet Take 1 tablet (1,000 mg total) by mouth 2 (two) times daily. 06/14/19   Shirley, [place], DO  methocarbamol (ROBAXIN) 750 MG tablet TAKE 1 TABLET (750 MG TOTAL) BY MOUTH EVERY 8 (EIGHT) HOURS AS NEEDED FOR MUSCLE SPASMS. 10/17/19   Shirley, [place], DO  nicotine (NICODERM CQ - DOSED IN MG/24 HOURS) 14 mg/24hr patch PLACE 1 PATCH (14 MG TOTAL) ONTO THE SKIN DAILY. Patient not taking: Reported on 10/19/2019 10/17/19   Shirley, [place], DO  omeprazole (PRILOSEC) 40 MG  capsule Take 1 capsule (40 mg total) by mouth daily. 08/28/19   Shirley, [place], DO    Family History Family History  Problem Relation Age of Onset  . Diabetes Mother   . Diabetes Sister   . Colon cancer Neg Hx   . Colon polyps Neg Hx   . Heart disease Neg Hx   . Rectal cancer Neg Hx   . Stomach cancer Neg Hx     Social History Social History   Tobacco Use  . Smoking status: Current Every Day Smoker    Packs/day: 0.30    Years: 33.00    Pack years: 9.90    Types: Cigarettes  . Smokeless tobacco: Never Used  Substance Use Topics  . Alcohol use: Yes    Alcohol/week: 0.0 standard drinks    Comment: rarely  . Drug use: No     Allergies   Vicodin [hydrocodone-acetaminophen]   Review of Systems Review of Systems  Constitutional: Negative for fatigue and fever.  Respiratory: Negative for cough and shortness of breath.   Cardiovascular: Negative for chest pain and palpitations.  Musculoskeletal:       Positive for toe pain  Neurological: Negative for weakness and numbness.     Physical Exam Triage Vital Signs ED Triage Vitals  Enc Vitals Group     BP      Pulse      Resp      Temp      Temp src      SpO2      Weight      Height      Head Circumference      Peak Flow      Pain Score      Pain Loc      Pain Edu?      Excl. in [place]?    No data found.  Updated Vital Signs BP (!) 152/98 (BP Location: Left Arm)   Pulse 98   Temp 97.9 F (36.6 C) (Temporal)   Resp 18   LMP 06/23/2013   SpO2 98%   Visual Acuity Right Eye Distance:   Left Eye Distance:   Bilateral Distance:    Right Eye Near:   Left Eye Near:    Bilateral Near:     Physical Exam Constitutional:      General: She is not in acute distress. HENT:     Head: Normocephalic and atraumatic.  Eyes:     General: No scleral icterus.    Pupils: Pupils are equal, round, and reactive to light.  Cardiovascular:     Rate and Rhythm: Normal rate.  Pulmonary:     Effort: Pulmonary effort is  normal.  Musculoskeletal:  Comments: Left second toe with mild edema as compared to right.  No erythema, warmth.  Mild tenderness to PIP without deformity, crepitus.  No diabetic ulcers or breaks in between toes.  Skin:    Coloration: Skin is not jaundiced or pale.  Neurological:     Mental Status: She is alert and oriented to person, place, and time.      UC Treatments / Results  Labs (all labs ordered are listed, but only abnormal results are displayed) Labs Reviewed - No data to display  EKG   Radiology No results found.  Procedures Procedures (including critical care time)  Medications Ordered in UC Medications - No data to display  Initial Impression / Assessment and Plan / UC Course  I have reviewed the triage vital signs and the nursing notes.  Pertinent labs & imaging results that were available during my care of the patient were reviewed by me and considered in my medical decision making (see chart for details).     Exam reassuring: Unsure etiology.  Will practice RICE x3 days and monitor closely.  Return precautions discussed, patient verbalized understanding and is agreeable to plan. Final Clinical Impressions(s) / UC Diagnoses   Final diagnoses:  Toe pain, left     Discharge Instructions     Recommend RICE: rest, ice, compression, elevation as needed for pain.    Heat therapy (hot compress, warm wash red, hot showers, etc.) can help relax muscles and soothe muscle aches. Cold therapy (ice packs) can be used to help swelling both after injury and after prolonged use of areas of chronic pain/aches.  For pain: recommend 350 mg-1000 mg of Tylenol (acetaminophen) and/or 200 mg - 800 mg of Advil (ibuprofen, Motrin) every 8 hours as needed.  May alternate between the two throughout the day as they are generally safe to take together.  DO NOT exceed more than 3000 mg of Tylenol or 3200 mg of ibuprofen in a 24 hour period as this could damage your stomach,  kidneys, liver, or increase your bleeding risk.   Return for worsening pain, swelling, redness, open wound, discharge, difficulty walking, fever.    ED Prescriptions    None     PDMP not reviewed this encounter.   [place], [place], [place] 10/30/19 2009

## 2019-11-16 ENCOUNTER — Other Ambulatory Visit: Payer: Self-pay | Admitting: Family Medicine

## 2019-11-26 ENCOUNTER — Other Ambulatory Visit: Payer: Self-pay

## 2019-11-26 ENCOUNTER — Emergency Department (HOSPITAL_COMMUNITY): Payer: Medicaid Other

## 2019-11-26 ENCOUNTER — Encounter (HOSPITAL_COMMUNITY): Payer: Self-pay | Admitting: Emergency Medicine

## 2019-11-26 ENCOUNTER — Emergency Department (HOSPITAL_COMMUNITY)
Admission: EM | Admit: 2019-11-26 | Discharge: 2019-11-26 | Disposition: A | Discharge status: Home / Self Care | Payer: Medicaid Other | Attending: Emergency Medicine | Admitting: Emergency Medicine

## 2019-11-26 DIAGNOSIS — Z7984 Long term (current) use of oral hypoglycemic drugs: Secondary | ICD-10-CM | POA: Diagnosis not present

## 2019-11-26 DIAGNOSIS — R079 Chest pain, unspecified: Secondary | ICD-10-CM

## 2019-11-26 DIAGNOSIS — Z7982 Long term (current) use of aspirin: Secondary | ICD-10-CM | POA: Insufficient documentation

## 2019-11-26 DIAGNOSIS — F17210 Nicotine dependence, cigarettes, uncomplicated: Secondary | ICD-10-CM | POA: Diagnosis not present

## 2019-11-26 DIAGNOSIS — E119 Type 2 diabetes mellitus without complications: Secondary | ICD-10-CM | POA: Insufficient documentation

## 2019-11-26 DIAGNOSIS — Z79899 Other long term (current) drug therapy: Secondary | ICD-10-CM | POA: Diagnosis not present

## 2019-11-26 DIAGNOSIS — I10 Essential (primary) hypertension: Secondary | ICD-10-CM | POA: Diagnosis not present

## 2019-11-26 DIAGNOSIS — R300 Dysuria: Secondary | ICD-10-CM | POA: Diagnosis present

## 2019-11-26 DIAGNOSIS — R309 Painful micturition, unspecified: Secondary | ICD-10-CM | POA: Diagnosis not present

## 2019-11-26 DIAGNOSIS — R102 Pelvic and perineal pain: Secondary | ICD-10-CM | POA: Diagnosis not present

## 2019-11-26 DIAGNOSIS — N3001 Acute cystitis with hematuria: Secondary | ICD-10-CM | POA: Diagnosis not present

## 2019-11-26 DIAGNOSIS — R0789 Other chest pain: Secondary | ICD-10-CM | POA: Insufficient documentation

## 2019-11-26 LAB — URINALYSIS, ROUTINE W REFLEX MICROSCOPIC
Bilirubin Urine: NEGATIVE
Glucose, UA: NEGATIVE mg/dL
Ketones, ur: NEGATIVE mg/dL
Nitrite: POSITIVE — AB
Protein, ur: 100 mg/dL — AB
Specific Gravity, Urine: 1.018 (ref 1.005–1.030)
WBC, UA: >50 WBC/hpf — ABNORMAL HIGH (ref 0–5)
pH: 5 (ref 5.0–8.0)

## 2019-11-26 LAB — BASIC METABOLIC PANEL
Anion gap: 9 (ref 5–15)
BUN: 16 mg/dL (ref 6–20)
CO2: 24 mmol/L (ref 22–32)
Calcium: 9.1 mg/dL (ref 8.9–10.3)
Chloride: 108 mmol/L (ref 98–111)
Creatinine, Ser: 0.93 mg/dL (ref 0.44–1.00)
GFR calc Af Amer: >60 mL/min (ref >60)
GFR calc non Af Amer: >60 mL/min (ref >60)
Glucose, Bld: 183 mg/dL — ABNORMAL HIGH (ref 70–99)
Potassium: 4.3 mmol/L (ref 3.5–5.1)
Sodium: 141 mmol/L (ref 135–145)

## 2019-11-26 LAB — CBC
HCT: 42.3 % (ref 36.0–46.0)
Hemoglobin: 13.7 g/dL (ref 12.0–15.0)
MCH: 31 pg (ref 26.0–34.0)
MCHC: 32.4 g/dL (ref 30.0–36.0)
MCV: 95.7 fL (ref 80.0–100.0)
Platelets: 414 10*3/uL — ABNORMAL HIGH (ref 150–400)
RBC: 4.42 MIL/uL (ref 3.87–5.11)
RDW: 13.2 % (ref 11.5–15.5)
WBC: 8.5 10*3/uL (ref 4.0–10.5)
nRBC: 0 % (ref 0.0–0.2)

## 2019-11-26 LAB — I-STAT BETA HCG BLOOD, ED (MC, WL, AP ONLY): I-stat hCG, quantitative: <5 m[IU]/mL (ref <5)

## 2019-11-26 LAB — TROPONIN I (HIGH SENSITIVITY)
Troponin I (High Sensitivity): 5 ng/L (ref <18)
Troponin I (High Sensitivity): 5 ng/L (ref <18)

## 2019-11-26 MED ORDER — PHENAZOPYRIDINE HCL 100 MG PO TABS
200.0000 mg | ORAL_TABLET | Freq: Once | ORAL | Status: AC
  Administered 2019-11-26: 200 mg via ORAL
  Filled 2019-11-26: qty 2

## 2019-11-26 MED ORDER — PHENAZOPYRIDINE HCL 200 MG PO TABS: 200.0000 mg | ORAL_TABLET | Freq: Three times a day (TID) | ORAL | 0 refills | Status: DC

## 2019-11-26 MED ORDER — CEPHALEXIN 250 MG PO CAPS
500.0000 mg | ORAL_CAPSULE | Freq: Once | ORAL | Status: AC
  Administered 2019-11-26: 500 mg via ORAL
  Filled 2019-11-26: qty 2

## 2019-11-26 MED ORDER — CEPHALEXIN 500 MG PO CAPS: 500.0000 mg | ORAL_CAPSULE | Freq: Four times a day (QID) | ORAL | 0 refills | Status: DC

## 2019-11-26 MED ORDER — SODIUM CHLORIDE 0.9% FLUSH: 3.0000 mL | Freq: Once | INTRAVENOUS | Status: DC

## 2019-11-26 NOTE — ED Triage Notes (Signed)
Pt reports painful urination X1 week.  Tonight she began to have substernal chest tightening.  Reports this has not happened in the past.  Pt reports some SOB w/ the chest tightness.

## 2019-11-26 NOTE — ED Provider Notes (Signed)
[place] EMERGENCY DEPARTMENT Provider Note   CSN: [id number] Arrival date & time: 11/26/19  0301     History Chief Complaint  Patient presents with  . Chest Pain  . painful uriation    Melanie Phillips is a 53 y.o. female.  Patient with history of T2DM, HTN, GERD presents to the ED with complaint of urinary discomfort and chest pain, both starting 1-2 days ago. No fever, cough, congestion. She has mild SOB with the chest tightness. No known COVID+ contacts. No nausea, vomiting, headache, sore throat. She denies vaginal discharge, unusual bleeding, blisters. The pain with urination is described as pain when finishing a stream of urine, even though she does not feel she has completely emptied her bladder.   The history is provided by the patient. No language interpreter was used.  Chest Pain Associated symptoms: shortness of breath   Associated symptoms: no abdominal pain, no back pain, no cough, no diaphoresis, no fever, no nausea and no vomiting        Past Medical History:  Diagnosis Date  . Anxiety   . Arthritis   . Back pain   . Depression   . Diabetes mellitus without complication ([place])   . GERD (gastroesophageal reflux disease)   . Hyperlipidemia   . Hypertension     Patient Active Problem List   Diagnosis Date Noted  . 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 Status post right knee replacement 09/12/2018  . Unilateral primary osteoarthritis, right knee 08/11/2018  . Tenosynovitis of finger 05/26/2017  . Arthritis of carpometacarpal [facility]) joint of right thumb 03/10/2017  . Essential hypertension 03/04/2017  . Depression 03/09/2016  . Carpal tunnel syndrome 03/09/2016  . Diabetes mellitus, type 2 ([place]) 08/15/2014  . Poor dental hygiene 08/15/2014  . Sciatica 05/31/2012  . OBESITY, UNSPECIFIED 03/07/2010  . TOBACCO ABUSE 03/07/2010  . PAIN IN JOINT, MULTIPLE SITES 03/07/2010    Past Surgical History:  Procedure Laterality Date    . CARPAL TUNNEL RELEASE Right 03/10/2017   Procedure: CARPAL TUNNEL RELEASE;  Surgeon: Leandrew Koyanagi, MD;  Location: [place];  Service: Orthopedics;  Laterality: Right;  . CARPOMETACARPEL SUSPENSION PLASTY Right 03/10/2017   Procedure: Right thumb Ligament Reconstruction Tendon Interposition, Right Carpal tunnel release;  Surgeon: Leandrew Koyanagi, MD;  Location: [place];  Service: Orthopedics;  Laterality: Right;  . JOINT REPLACEMENT    . 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   Procedure: RIGHT TOTAL KNEE ARTHROPLASTY;  Surgeon: Leandrew Koyanagi, MD;  Location: [place];  Service: Orthopedics;  Laterality: Right;  . TRIGGER FINGER RELEASE Left 05/26/2017   Procedure: RELEASE TRIGGER FINGER LEFT THUMB;  Surgeon: Leandrew Koyanagi, MD;  Location: [place];  Service: Orthopedics;  Laterality: Left;  . TRIGGER FINGER RELEASE Right 02/23/2018   Procedure: RELEASE RIGHT TRIGGER THUMB;  Surgeon: Leandrew Koyanagi, MD;  Location: [place];  Service: Orthopedics;  Laterality: Right;  Bier block  . TUBAL LIGATION       OB History    Gravida  6   Para      Term      Preterm      AB      Living  6     SAB      TAB      Ectopic      Multiple      Live Births  Family History  Problem Relation Age of Onset  . Diabetes Mother   . Diabetes Sister   . Colon cancer Neg Hx   . Colon polyps Neg Hx   . Heart disease Neg Hx   . Rectal cancer Neg Hx   . Stomach cancer Neg Hx     Social History   Tobacco Use  . Smoking status: Current Every Day Smoker    Packs/day: 0.30    Years: 33.00    Pack years: 9.90    Types: Cigarettes  . Smokeless tobacco: Never Used  Substance Use Topics  . Alcohol use: Yes    Alcohol/week: 0.0 standard drinks    Comment: rarely  . Drug use: No    Home Medications Prior to Admission medications   Medication Sig Start Date End Date Taking? Authorizing Provider  ACCU-CHEK  FASTCLIX LANCETS MISC Use as needed to check blood sugar. 03/30/18   Steve Rattler, DO  aspirin EC 81 MG tablet Take 1 tablet (81 mg total) by mouth daily. Patient taking differently: Take 81 mg by mouth every evening.  08/15/14   Katheren Shams, DO  atorvastatin (LIPITOR) 20 MG tablet Take 1 tablet (20 mg total) by mouth daily. 08/28/19   Shirley, [place], DO  glucose blood (ACCU-CHEK SMARTVIEW) test strip Use as instructed 03/30/18   Lucila Maine C, DO  lisinopril (ZESTRIL) 10 MG tablet Take 1 tablet (10 mg total) by mouth at bedtime. 08/28/19   Shirley, [place], DO  metFORMIN (GLUCOPHAGE) 1000 MG tablet TAKE 1 TABLET BY MOUTH TWICE A DAY 11/17/19   Enid Derry, [place], DO  methocarbamol (ROBAXIN) 750 MG tablet TAKE 1 TABLET (750 MG TOTAL) BY MOUTH EVERY 8 (EIGHT) HOURS AS NEEDED FOR MUSCLE SPASMS. 10/17/19   Shirley, [place], DO  nicotine (NICODERM CQ - DOSED IN MG/24 HOURS) 14 mg/24hr patch PLACE 1 PATCH (14 MG TOTAL) ONTO THE SKIN DAILY. Patient not taking: Reported on 10/19/2019 10/17/19   Shirley, [place], DO  omeprazole (PRILOSEC) 40 MG capsule Take 1 capsule (40 mg total) by mouth daily. 08/28/19   Shirley, [place], DO    Allergies    Vicodin [hydrocodone-acetaminophen]  Review of Systems   Review of Systems  Constitutional: Negative for chills, diaphoresis and fever.  HENT: Negative.   Respiratory: Positive for chest tightness and shortness of breath. Negative for cough.   Cardiovascular: Positive for chest pain.  Gastrointestinal: Negative.  Negative for abdominal pain, nausea and vomiting.  Genitourinary: Positive for dysuria and vaginal pain. Negative for flank pain and hematuria.  Musculoskeletal: Negative.  Negative for back pain.  Skin: Negative.   Neurological: Negative.     Physical Exam Updated Vital Signs BP (!) 129/93 (BP Location: Left Arm)   Pulse 79   Temp 98.5 F (36.9 C) (Oral)   Resp 18   LMP 06/23/2013   SpO2 95%   Physical Exam Vitals and nursing note  reviewed.  Constitutional:      Appearance: She is well-developed.  HENT:     Head: Normocephalic.  Cardiovascular:     Rate and Rhythm: Normal rate and regular rhythm.     Heart sounds: No murmur.  Pulmonary:     Effort: Pulmonary effort is normal.     Breath sounds: Normal breath sounds. No wheezing, rhonchi or rales.  Abdominal:     General: Bowel sounds are normal.     Palpations: Abdomen is soft.     Tenderness: There is no abdominal tenderness. There is no guarding or  rebound.  Musculoskeletal:        General: Normal range of motion.     Cervical back: Normal range of motion and neck supple.     Right lower leg: No edema.     Left lower leg: No edema.  Skin:    General: Skin is warm and dry.     Findings: No rash.  Neurological:     General: No focal deficit present.     Mental Status: She is alert and oriented to person, place, and time.     ED Results / Procedures / Treatments   Labs (all labs ordered are listed, but only abnormal results are displayed) Labs Reviewed  BASIC METABOLIC PANEL - Abnormal; Notable for the following components:      Result Value   Glucose, Bld 183 (*)    All other components within normal limits  CBC - Abnormal; Notable for the following components:   Platelets 414 (*)    All other components within normal limits  URINALYSIS, ROUTINE W REFLEX MICROSCOPIC - Abnormal; Notable for the following components:   APPearance CLOUDY (*)    Hgb urine dipstick MODERATE (*)    Protein, ur 100 (*)    Nitrite POSITIVE (*)    Leukocytes,Ua LARGE (*)    WBC, UA >50 (*)    Bacteria, UA MANY (*)    All other components within normal limits  URINE CULTURE  I-STAT BETA HCG BLOOD, ED ([facility], [facility], [facility] ONLY)  TROPONIN I (HIGH SENSITIVITY)  TROPONIN I (HIGH SENSITIVITY)    EKG EKG Interpretation  Date/Time:  Sunday November 26 2019 03:11:30 EST Ventricular Rate:  83 PR Interval:  152 QRS Duration: 98 QT Interval:  410 QTC Calculation: 481 R  Axis:   -5 Text Interpretation: Normal sinus rhythm Cannot rule out Anterior infarct , age undetermined Abnormal ECG No significant change since 10/19 Confirmed by Aletta Edouard [phone]) on 11/26/2019 8:12:26 AM   Radiology DG Chest Portable 1 View  Result Date: 11/26/2019 CLINICAL DATA:  Painful urination EXAM: PORTABLE CHEST 1 VIEW COMPARISON:  09/07/2018 FINDINGS: The heart size and mediastinal contours are within normal limits. Both lungs are clear. The visualized skeletal structures are unremarkable. IMPRESSION: No active disease. Electronically Signed   By: Ulyses Jarred M.D.   On: 11/26/2019 03:44    Procedures Procedures (including critical care time)  Medications Ordered in ED Medications  sodium chloride flush (NS) 0.9 % injection 3 mL (has no administration in time range)    ED Course  I have reviewed the triage vital signs and the nursing notes.  Pertinent labs & imaging results that were available during my care of the patient were reviewed by me and considered in my medical decision making (see chart for details).    MDM Rules/Calculators/A&P                      Patient to ED with dysuria and UTI symptoms, as well as chest tightness with mild SOB, both x 1-2 days. No fever, vomiting.  She is well appearing. She denies ongoing chest symptoms. CXR is clear, EKG unchanged from previous. Labs are unremarkable, including troponin x 2, with the exception of the presence of UTI.  She can be discharged home with diagnosis of uncomplicated UTI, on Keflex and pyridium. Culture pending. Chest pain is not felt to represent ACS, infection or airway disease. These symptoms have resolved. Encouraged her to see her doctor if they return.  Final Clinical Impression(s) / ED Diagnoses Final diagnoses:  None   1. Uncomplicated UTI 2. Nonspecific chest pain  Rx / DC Orders ED Discharge Orders    None       Charlann Lange, PA-C 11/26/19 [id number]    Hayden Rasmussen,  MD 11/26/19 1704

## 2019-11-26 NOTE — Discharge Instructions (Addendum)
Take medications as prescribed for urinary tract infection. This should clear the infection and help with pain.   If symptoms worsen or do not improve, please see your doctor for further evaluation and treatment.   Return to the emergency department with any new or worsening symptoms as needed.

## 2019-11-27 LAB — URINE CULTURE

## 2020-02-24 DIAGNOSIS — R197 Diarrhea, unspecified: Secondary | ICD-10-CM | POA: Diagnosis not present

## 2020-02-24 DIAGNOSIS — R531 Weakness: Secondary | ICD-10-CM | POA: Diagnosis not present

## 2020-02-24 DIAGNOSIS — R1084 Generalized abdominal pain: Secondary | ICD-10-CM | POA: Diagnosis not present

## 2020-02-24 DIAGNOSIS — R4020 Unspecified coma: Secondary | ICD-10-CM | POA: Diagnosis not present

## 2020-02-24 DIAGNOSIS — R52 Pain, unspecified: Secondary | ICD-10-CM | POA: Diagnosis not present

## 2020-04-17 ENCOUNTER — Other Ambulatory Visit: Payer: Self-pay | Admitting: Family Medicine

## 2020-04-20 DIAGNOSIS — Z23 Encounter for immunization: Secondary | ICD-10-CM | POA: Diagnosis not present

## 2020-05-26 ENCOUNTER — Encounter (HOSPITAL_COMMUNITY): Payer: Self-pay | Admitting: Emergency Medicine

## 2020-05-26 ENCOUNTER — Other Ambulatory Visit: Payer: Self-pay

## 2020-05-26 ENCOUNTER — Emergency Department (HOSPITAL_COMMUNITY)
Admission: EM | Admit: 2020-05-26 | Discharge: 2020-05-26 | Disposition: A | Discharge status: Home / Self Care | Payer: Medicaid Other | Attending: Emergency Medicine | Admitting: Emergency Medicine

## 2020-05-26 DIAGNOSIS — R739 Hyperglycemia, unspecified: Secondary | ICD-10-CM

## 2020-05-26 DIAGNOSIS — R1013 Epigastric pain: Secondary | ICD-10-CM | POA: Insufficient documentation

## 2020-05-26 DIAGNOSIS — F17210 Nicotine dependence, cigarettes, uncomplicated: Secondary | ICD-10-CM | POA: Insufficient documentation

## 2020-05-26 DIAGNOSIS — Z7984 Long term (current) use of oral hypoglycemic drugs: Secondary | ICD-10-CM | POA: Insufficient documentation

## 2020-05-26 DIAGNOSIS — Z79899 Other long term (current) drug therapy: Secondary | ICD-10-CM | POA: Insufficient documentation

## 2020-05-26 DIAGNOSIS — I10 Essential (primary) hypertension: Secondary | ICD-10-CM | POA: Diagnosis not present

## 2020-05-26 DIAGNOSIS — Z7982 Long term (current) use of aspirin: Secondary | ICD-10-CM | POA: Diagnosis not present

## 2020-05-26 DIAGNOSIS — E1165 Type 2 diabetes mellitus with hyperglycemia: Secondary | ICD-10-CM | POA: Insufficient documentation

## 2020-05-26 DIAGNOSIS — R1010 Upper abdominal pain, unspecified: Secondary | ICD-10-CM | POA: Diagnosis present

## 2020-05-26 DIAGNOSIS — R112 Nausea with vomiting, unspecified: Secondary | ICD-10-CM | POA: Insufficient documentation

## 2020-05-26 DIAGNOSIS — Z96651 Presence of right artificial knee joint: Secondary | ICD-10-CM | POA: Insufficient documentation

## 2020-05-26 LAB — COMPREHENSIVE METABOLIC PANEL
ALT: 14 U/L (ref 0–44)
AST: 12 U/L — ABNORMAL LOW (ref 15–41)
Albumin: 3.5 g/dL (ref 3.5–5.0)
Alkaline Phosphatase: 110 U/L (ref 38–126)
Anion gap: 15 (ref 5–15)
BUN: 12 mg/dL (ref 6–20)
CO2: 22 mmol/L (ref 22–32)
Calcium: 9.3 mg/dL (ref 8.9–10.3)
Chloride: 96 mmol/L — ABNORMAL LOW (ref 98–111)
Creatinine, Ser: 1.03 mg/dL — ABNORMAL HIGH (ref 0.44–1.00)
GFR calc Af Amer: >60 mL/min (ref >60)
GFR calc non Af Amer: >60 mL/min (ref >60)
Glucose, Bld: 459 mg/dL — ABNORMAL HIGH (ref 70–99)
Potassium: 4.1 mmol/L (ref 3.5–5.1)
Sodium: 133 mmol/L — ABNORMAL LOW (ref 135–145)
Total Bilirubin: 0.8 mg/dL (ref 0.3–1.2)
Total Protein: 6.8 g/dL (ref 6.5–8.1)

## 2020-05-26 LAB — CBC
HCT: 46 % (ref 36.0–46.0)
Hemoglobin: 15.1 g/dL — ABNORMAL HIGH (ref 12.0–15.0)
MCH: 30 pg (ref 26.0–34.0)
MCHC: 32.8 g/dL (ref 30.0–36.0)
MCV: 91.3 fL (ref 80.0–100.0)
Platelets: 371 10*3/uL (ref 150–400)
RBC: 5.04 MIL/uL (ref 3.87–5.11)
RDW: 13.5 % (ref 11.5–15.5)
WBC: 13.2 10*3/uL — ABNORMAL HIGH (ref 4.0–10.5)
nRBC: 0 % (ref 0.0–0.2)

## 2020-05-26 LAB — LIPASE, BLOOD: Lipase: 35 U/L (ref 11–51)

## 2020-05-26 LAB — CBG MONITORING, ED
Glucose-Capillary: 469 mg/dL — ABNORMAL HIGH (ref 70–99)
Glucose-Capillary: 420 mg/dL — ABNORMAL HIGH (ref 70–99)

## 2020-05-26 MED ORDER — LIDOCAINE VISCOUS HCL 2 % MT SOLN
15.0000 mL | Freq: Once | OROMUCOSAL | Status: AC
  Administered 2020-05-26: 15 mL via ORAL
  Filled 2020-05-26: qty 15

## 2020-05-26 MED ORDER — ALUM & MAG HYDROXIDE-SIMETH 200-200-20 MG/5ML PO SUSP
30.0000 mL | Freq: Once | ORAL | Status: AC
  Administered 2020-05-26: 30 mL via ORAL
  Filled 2020-05-26: qty 30

## 2020-05-26 MED ORDER — SODIUM CHLORIDE 0.9% FLUSH: 3.0000 mL | Freq: Once | INTRAVENOUS | Status: DC

## 2020-05-26 MED ORDER — ONDANSETRON 8 MG PO TBDP
8.0000 mg | ORAL_TABLET | Freq: Three times a day (TID) | ORAL | 0 refills | Status: DC | PRN
Start: 2020-05-26 — End: 2020-06-12

## 2020-05-26 MED ORDER — ONDANSETRON 4 MG PO TBDP
8.0000 mg | ORAL_TABLET | Freq: Once | ORAL | Status: AC
  Administered 2020-05-26: 8 mg via ORAL
  Filled 2020-05-26: qty 2

## 2020-05-26 NOTE — ED Provider Notes (Addendum)
[place] [facility] EMERGENCY DEPARTMENT Provider Note   CSN: [id number] Arrival date & time: 05/26/20  1214     History Chief Complaint  Patient presents with  . Abdominal Pain    Melanie Phillips is a 54 y.o. female.  HPI      54 year old female morbidly obese history of type 2 diabetes, history of GERD, history of hyperlipidemia, presents today complaining of upper abdominal pain that began this morning around 8 AM.  She did drink several shots of liquor last night.  She reports a history of GERD.  She became nauseated and vomited one time.  Emesis was nonbilious and nonbloody.  She has not attempted to eat or drink anything since that time.  She has taken nothing for pain.  It is s not clear if she has had similar pain in the past.  She does report that she has had pain in this area with reflux. Patient has no fever, chills, diarrhea, dyspnea, chest pain, urinary tract infection symptoms.  She reports that she voided just prior to coming into the room in the bathroom. Past Medical History:  Diagnosis Date  . Anxiety   . Arthritis   . Back pain   . Depression   . Diabetes mellitus without complication ([place])   . GERD (gastroesophageal reflux disease)   . Hyperlipidemia   . Hypertension     Patient Active Problem List   Diagnosis Date Noted  . 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 Status post right knee replacement 09/12/2018  . Unilateral primary osteoarthritis, right knee 08/11/2018  . Tenosynovitis of finger 05/26/2017  . Arthritis of carpometacarpal [facility]) joint of right thumb 03/10/2017  . Essential hypertension 03/04/2017  . Depression 03/09/2016  . Carpal tunnel syndrome 03/09/2016  . Diabetes mellitus, type 2 ([place]) 08/15/2014  . Poor dental hygiene 08/15/2014  . Sciatica 05/31/2012  . OBESITY, UNSPECIFIED 03/07/2010  . TOBACCO ABUSE 03/07/2010  . PAIN IN JOINT, MULTIPLE SITES 03/07/2010    Past Surgical History:  Procedure Laterality Date    . CARPAL TUNNEL RELEASE Right 03/10/2017   Procedure: CARPAL TUNNEL RELEASE;  Surgeon: Leandrew Koyanagi, MD;  Location: [place];  Service: Orthopedics;  Laterality: Right;  . CARPOMETACARPEL SUSPENSION PLASTY Right 03/10/2017   Procedure: Right thumb Ligament Reconstruction Tendon Interposition, Right Carpal tunnel release;  Surgeon: Leandrew Koyanagi, MD;  Location: [place];  Service: Orthopedics;  Laterality: Right;  . JOINT REPLACEMENT    . 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   Procedure: RIGHT TOTAL KNEE ARTHROPLASTY;  Surgeon: Leandrew Koyanagi, MD;  Location: [place];  Service: Orthopedics;  Laterality: Right;  . TRIGGER FINGER RELEASE Left 05/26/2017   Procedure: RELEASE TRIGGER FINGER LEFT THUMB;  Surgeon: Leandrew Koyanagi, MD;  Location: [place];  Service: Orthopedics;  Laterality: Left;  . TRIGGER FINGER RELEASE Right 02/23/2018   Procedure: RELEASE RIGHT TRIGGER THUMB;  Surgeon: Leandrew Koyanagi, MD;  Location: [place];  Service: Orthopedics;  Laterality: Right;  Bier block  . TUBAL LIGATION       OB History    Gravida  6   Para      Term      Preterm      AB      Living  6     SAB      TAB      Ectopic      Multiple  Live Births              Family History  Problem Relation Age of Onset  . Diabetes Mother   . Diabetes Sister   . Colon cancer Neg Hx   . Colon polyps Neg Hx   . Heart disease Neg Hx   . Rectal cancer Neg Hx   . Stomach cancer Neg Hx     Social History   Tobacco Use  . Smoking status: Current Every Day Smoker    Packs/day: 0.30    Years: 33.00    Pack years: 9.90    Types: Cigarettes  . Smokeless tobacco: Never Used  Vaping Use  . Vaping Use: Never used  Substance Use Topics  . Alcohol use: Yes    Alcohol/week: 0.0 standard drinks    Comment: rarely  . Drug use: No    Home Medications Prior to Admission medications   Medication Sig Start Date End Date Taking?  Authorizing Provider  ACCU-CHEK FASTCLIX LANCETS MISC Use as needed to check blood sugar. 03/30/18   Steve Rattler, DO  aspirin EC 81 MG tablet Take 1 tablet (81 mg total) by mouth daily. Patient taking differently: Take 81 mg by mouth every evening.  08/15/14   Katheren Shams, DO  atorvastatin (LIPITOR) 20 MG tablet Take 1 tablet (20 mg total) by mouth daily. 08/28/19   Shirley, [place], DO  cephALEXin (KEFLEX) 500 MG capsule Take 1 capsule (500 mg total) by mouth 4 (four) times daily. 11/26/19   Charlann Lange, PA-C  glucose blood (ACCU-CHEK SMARTVIEW) test strip Use as instructed 03/30/18   Lucila Maine C, DO  lisinopril (ZESTRIL) 10 MG tablet Take 1 tablet (10 mg total) by mouth at bedtime. 08/28/19   Shirley, [place], DO  metFORMIN (GLUCOPHAGE) 1000 MG tablet TAKE 1 TABLET BY MOUTH TWICE A DAY 04/17/20   Enid Derry, [place], DO  methocarbamol (ROBAXIN) 750 MG tablet TAKE 1 TABLET (750 MG TOTAL) BY MOUTH EVERY 8 (EIGHT) HOURS AS NEEDED FOR MUSCLE SPASMS. 10/17/19   Shirley, [place], DO  nicotine (NICODERM CQ - DOSED IN MG/24 HOURS) 14 mg/24hr patch PLACE 1 PATCH (14 MG TOTAL) ONTO THE SKIN DAILY. Patient not taking: Reported on 10/19/2019 10/17/19   Shirley, [place], DO  omeprazole (PRILOSEC) 40 MG capsule Take 1 capsule (40 mg total) by mouth daily. 08/28/19   Shirley, [place], DO  phenazopyridine (PYRIDIUM) 200 MG tablet Take 1 tablet (200 mg total) by mouth 3 (three) times daily. 11/26/19   Charlann Lange, PA-C    Allergies    Vicodin [hydrocodone-acetaminophen]  Review of Systems   Review of Systems  All other systems reviewed and are negative.   Physical Exam Updated Vital Signs BP 135/74 (BP Location: Right Arm)   Pulse 70   Temp 98.6 F (37 C) (Oral)   Resp 20   LMP 06/23/2013   SpO2 97%   Physical Exam Vitals and nursing note reviewed. Exam conducted with a chaperone present.  Constitutional:      Appearance: She is well-developed. She is obese.  HENT:     Head: Normocephalic.    Eyes:     Extraocular Movements: Extraocular movements intact.  Cardiovascular:     Rate and Rhythm: Normal rate and regular rhythm.     Heart sounds: Normal heart sounds.  Pulmonary:     Breath sounds: Normal breath sounds.  Abdominal:     General: Abdomen is protuberant. Bowel sounds are normal.     Palpations: Abdomen is  soft.     Tenderness: There is abdominal tenderness.     Comments: Mild epigastric tenderness to palpation.  Abdomen is soft and no rebound is present No right-sided abdominal pain is palpated. No left-sided abdominal pain is palpated. No lower abdominal pain is palpated.  Skin:    General: Skin is warm and dry.     Capillary Refill: Capillary refill takes less than 2 seconds.  Neurological:     General: No focal deficit present.     Mental Status: She is alert.  Psychiatric:        Attention and Perception: She is inattentive.        Mood and Affect: Affect is flat.        Speech: Speech normal.        Behavior: Behavior is slowed.        Thought Content: Thought content normal.     ED Results / Procedures / Treatments   Labs (all labs ordered are listed, but only abnormal results are displayed) Labs Reviewed  COMPREHENSIVE METABOLIC PANEL - Abnormal; Notable for the following components:      Result Value   Sodium 133 (*)    Chloride 96 (*)    Glucose, Bld 459 (*)    Creatinine, Ser 1.03 (*)    AST 12 (*)    All other components within normal limits  CBC - Abnormal; Notable for the following components:   WBC 13.2 (*)    Hemoglobin 15.1 (*)    All other components within normal limits  CBG MONITORING, ED - Abnormal; Notable for the following components:   Glucose-Capillary 469 (*)    All other components within normal limits  LIPASE, BLOOD  URINALYSIS, ROUTINE W REFLEX MICROSCOPIC    EKG None  Radiology No results found.  Procedures Procedures (including critical care time)  Medications Ordered in ED Medications  sodium chloride  flush (NS) 0.9 % injection 3 mL (has no administration in time range)  alum & mag hydroxide-simeth (MAALOX/MYLANTA) 200-200-20 MG/5ML suspension 30 mL (has no administration in time range)    And  lidocaine (XYLOCAINE) 2 % viscous mouth solution 15 mL (has no administration in time range)  ondansetron (ZOFRAN-ODT) disintegrating tablet 8 mg (has no administration in time range)    ED Course  I have reviewed the triage vital signs and the nursing notes.  Pertinent labs & imaging results that were available during my care of the patient were reviewed by me and considered in my medical decision making (see chart for details).    MDM Rules/Calculators/A&P                         Patient with epigastric pain associated with nausea and vomiting.  Differential diagnosis includes alcohol gastritis, GERD, biliary colic.  Patient has known diabetes and has elevated blood sugarmild leukocytosis at 13,800.  otherwise labs are essentially normal with  normal transaminases and lipase.  Bilirubin is normal.  Blood sugar is elevated at 459.  Patient given GI cocktail and Zofran.  She has tolerated clear liquids here.  She does not have any evidence of DKA.  I discussed with her return precautions and follow-up which reports understanding. Tolerating fluids.  Discussed need for follow-up and return precaution voices understanding.  Final Clinical Impression(s) / ED Diagnoses Final diagnoses:  Epigastric pain    Rx / DC Orders ED Discharge Orders    None       Audy Dauphine, Andee Poles,  MD 05/26/20 1849    Pattricia Boss, MD 05/26/20 [phone]

## 2020-05-26 NOTE — ED Triage Notes (Signed)
C/o R sided abd pain since this morning.  Vomited x 1.  Denies diarrhea.

## 2020-05-26 NOTE — ED Notes (Signed)
Pt asleep whne I netered the room  The pt kept falling asleep while I talked to her.  She woke up this am with abd pain  Nausea  No diarrhea  She takes metformin for her diabetes

## 2020-05-26 NOTE — Discharge Instructions (Addendum)
Please follow your blood sugars. Began with only clear liquids tonight. Return to the emergency department if you are worse including increased pain or inability to tolerate liquids Recheck with your doctor in the next 24 hours if symptoms have not resolved Please avoid all alcohol

## 2020-05-26 NOTE — ED Notes (Signed)
The pt goes back to sleep

## 2020-05-26 NOTE — ED Notes (Signed)
Pt sound asleep when I came in to give meds  She denies taking any med before she came here

## 2020-05-27 ENCOUNTER — Telehealth: Payer: Self-pay

## 2020-05-27 NOTE — Telephone Encounter (Signed)
Patient calls nurse line reporting elevated blood sugar. Patient reports CBG 459 one hour ago, took metformin, now is 408. Patient is currently asymptomatic. Patient was seen in the ED last night and was given nausea medication. Patient scheduled OV for tomorrow.   Strict ED precautions given.   Talbot Grumbling, RN

## 2020-05-28 ENCOUNTER — Other Ambulatory Visit: Payer: Self-pay

## 2020-05-28 ENCOUNTER — Encounter: Payer: Self-pay | Admitting: Family Medicine

## 2020-05-28 ENCOUNTER — Ambulatory Visit (INDEPENDENT_AMBULATORY_CARE_PROVIDER_SITE_OTHER): Payer: Medicaid Other | Admitting: Family Medicine

## 2020-05-28 VITALS — BP 134/82 | HR 91 | Ht 69.0 in | Wt 235.6 lb

## 2020-05-28 DIAGNOSIS — E119 Type 2 diabetes mellitus without complications: Secondary | ICD-10-CM

## 2020-05-28 LAB — POCT GLYCOSYLATED HEMOGLOBIN (HGB A1C): HbA1c, POC (controlled diabetic range): 14.5 % — AB (ref 0.0–7.0)

## 2020-05-28 MED ORDER — ACCU-CHEK SMARTVIEW VI STRP: ORAL_STRIP | 12 refills | Status: DC

## 2020-05-28 MED ORDER — INSULIN GLARGINE 100 UNIT/ML SOLOSTAR PEN
10.0000 [IU] | PEN_INJECTOR | Freq: Every day | SUBCUTANEOUS | 11 refills | Status: DC
Start: 2020-05-28 — End: 2021-11-12

## 2020-05-28 MED ORDER — INSULIN GLARGINE 100 UNIT/ML SOLOSTAR PEN
10.0000 [IU] | PEN_INJECTOR | Freq: Every day | SUBCUTANEOUS | 11 refills | Status: DC
Start: 2020-05-28 — End: 2020-05-28

## 2020-05-28 MED ORDER — ACCU-CHEK GUIDE W/DEVICE KIT: 1.0000 | PACK | Freq: Two times a day (BID) | 0 refills | Status: DC

## 2020-05-28 MED ORDER — ACCU-CHEK FASTCLIX LANCETS MISC: 5 refills | Status: DC

## 2020-05-28 NOTE — Patient Instructions (Signed)
I have started you on insulin for your diabetes. Please make an appointment to see Dr. Valentina Lucks in 2 weeks and make sure you check your blood sugar in the mornings and evenings. If in the morning you are over 200 add 2U to your night time insulin dose.  Harolyn Rutherford, DO

## 2020-05-28 NOTE — Progress Notes (Signed)
    SUBJECTIVE:   CHIEF COMPLAINT / HPI:   Uncontrolled T2DM Patient presents today after an ED visit 3 days ago for abdominal pain. She noted her blood glucose in the ED was over 400. She had it checked over the next several days and states earlier today it has come down to 308 this am. She doesn't have her own glucometer. She overall is feeling better. She states she has been drinking a lot of water, avoiding sweets and sodas to get it down over the past 3 days. Before she had been well controlled on Metformin. When I asked her what has changed she denies any systemic symptoms of illness such as fever, chills, nausea, vomiting. She states she has been eating lots of sugar foods, eating "all through the night" and drinking sodas. She states that she thought it was "not that bad" because she never felt bad. I did explain to the patient that blood sugar can get dangerously high at times and she could not feel anything. We discussed stopping the habits of eating at night, avoiding sweets, snacks, and sodas.  PERTINENT  PMH / PSH: T2DM, HTN  OBJECTIVE:   BP 134/82   Pulse 91   Ht 5\' 9"  (1.753 m)   Wt 235 lb 9.6 oz (106.9 kg)   LMP 06/23/2013   SpO2 99%   BMI 34.79 kg/m   Gen: NAD Cardiac: RRR, no murmurs Resp: CTAB, no wheezing, no crackles, no labored breathing  ASSESSMENT/PLAN:   Diabetes mellitus, type 2 - Uncontrolled due to diet. Gave patient info on what to avoid and she understood. - Starting Lantus at 10U nightly; increase by 2U each morning her fasting am blood glucose is over 200. - Cont Metformin - Repeat BMP at next visit - F/u with Dr. Valentina Lucks in 2 weeks     Nuala Alpha, [place]

## 2020-05-29 NOTE — Assessment & Plan Note (Signed)
-   Uncontrolled due to diet. Gave patient info on what to avoid and she understood. - Starting Lantus at 10U nightly; increase by 2U each morning her fasting am blood glucose is over 200. - Cont Metformin - Repeat BMP at next visit - F/u with Dr. Valentina Lucks in 2 weeks

## 2020-06-12 ENCOUNTER — Other Ambulatory Visit: Payer: Self-pay

## 2020-06-12 ENCOUNTER — Encounter: Payer: Self-pay | Admitting: Family Medicine

## 2020-06-12 ENCOUNTER — Ambulatory Visit (INDEPENDENT_AMBULATORY_CARE_PROVIDER_SITE_OTHER): Payer: Medicaid Other | Admitting: Family Medicine

## 2020-06-12 VITALS — BP 112/60 | HR 99 | Ht 69.0 in | Wt 238.1 lb

## 2020-06-12 DIAGNOSIS — Z23 Encounter for immunization: Secondary | ICD-10-CM | POA: Insufficient documentation

## 2020-06-12 DIAGNOSIS — E119 Type 2 diabetes mellitus without complications: Secondary | ICD-10-CM | POA: Diagnosis not present

## 2020-06-12 DIAGNOSIS — M19041 Primary osteoarthritis, right hand: Secondary | ICD-10-CM | POA: Diagnosis not present

## 2020-06-12 MED ORDER — TRAMADOL HCL 50 MG PO TABS: 50.0000 mg | ORAL_TABLET | Freq: Every day | ORAL | 0 refills | Status: AC | PRN

## 2020-06-12 NOTE — Progress Notes (Signed)
    SUBJECTIVE:   CHIEF COMPLAINT / HPI:   Diabetes: This is a pleasant patient following up today for her blood sugar.  Reports that she came in about 2 weeks ago and her CBG was 420!  Reports that at the time she was placed on Lantus 10 units and told to increase her Lantus dosing every evening by 2 units until her morning dose was under 200.  Now her morning doses are around the 200 range, lowest has been 180. She also continues to take metformin.  Currently denying any polyuria, polydipsia, polyphagia, headaches.  Occasionally has vision changes with blurred vision which self resolved.  Has been checking her blood sugar 3-4 times daily.  She is currently overdue for ophthalmology/eye exam, also due for foot screening.  Rechecking HbA1c at today's visit.  Arthritis of right PIP: Patient reports severe pain/tenderness to the PIP joint of second finger on right hand.  Patient herself is right-handed.  Reports that the pain is only occasionally so severe that she has to stop herself and what she is doing to rest the hand.  Denies any trauma, accidents, injuries.  Health maintenance: -Patient due for Tdap vaccine -Covid vaccine: Patient received both vaccines, Pfizer -Due for foot exam today -Patient informed she is due for Pap smear any day now   PERTINENT  PMH / PSH:  Patient Active Problem List   Diagnosis Date Noted  . Need for Tdap vaccination 06/12/2020  . Arthritis of hand, right 06/12/2020  . 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 Status post right knee replacement 09/12/2018  . Unilateral primary osteoarthritis, right knee 08/11/2018  . Tenosynovitis of finger 05/26/2017  . Arthritis of carpometacarpal [facility]) joint of right thumb 03/10/2017  . Essential hypertension 03/04/2017  . Depression 03/09/2016  . Carpal tunnel syndrome 03/09/2016  . Diabetes mellitus, type 2 ([place]) 08/15/2014  . Poor dental hygiene 08/15/2014  . Sciatica 05/31/2012  . OBESITY, UNSPECIFIED  03/07/2010  . TOBACCO ABUSE 03/07/2010  . PAIN IN JOINT, MULTIPLE SITES 03/07/2010     OBJECTIVE:   BP 112/60   Pulse 99   Ht 5\' 9"  (1.753 m)   Wt 238 lb 2 oz (108 kg)   LMP 06/23/2013   SpO2 99%   BMI 35.16 kg/m    Physical exam: General: Well-appearing patient, pleasant Respiratory: CTA bilaterally, comfortable work of breathing Extremity: Mild swelling with minimal tenderness to palpation of right PIP, no evidence of fluctuance, erythema, purulence, or drainage.   Integumentary: No rashes appreciated   ASSESSMENT/PLAN:   Diabetes mellitus, type 2 -Encouraged the patient to continue to titrate the Lantus up to units daily to obtain blood sugars every morning less than 140. -Follow-up 2-4 weeks for blood sugars or sooner as needed -Would benefit from SGLT medication -Due for repeat eye exam-referral to ophthalmology made  Arthritis of hand, right Appreciated to second PIP.  Most likely OA, patient reports pain worsens during the day with use, has to rest it to make it feel better.  Occasionally takes Tylenol. -Tramadol 50 every other day as needed severe aches/pains -Recommend patient be seen for joint injection -Continue to follow in the future  Need for Tdap vaccination Patient overdue for Tdap vaccine -Tdap vaccine ordered and administered today     Daisy Floro, [place]

## 2020-06-12 NOTE — Assessment & Plan Note (Addendum)
-  Encouraged the patient to continue to titrate the Lantus up to units daily to obtain blood sugars every morning less than 140. -Follow-up 2-4 weeks for blood sugars or sooner as needed -Would benefit from SGLT medication -Due for repeat eye exam-referral to ophthalmology made

## 2020-06-12 NOTE — Assessment & Plan Note (Signed)
Patient overdue for Tdap vaccine -Tdap vaccine ordered and administered today

## 2020-06-12 NOTE — Patient Instructions (Addendum)
Thank you for coming in to see [place] today! Please see below to review our plan for today's visit:  1. Keeping going up on your Lantus by 2 units every night that your sugar is greater than 140 in the morning. Keep checking your blood sugar every day like you have been.  2. Please follow up with [place] in about 2-4 more weeks to make sure you are on the right track!   Please call the clinic at [phone] if your symptoms worsen or you have any concerns. It was our pleasure to serve you!   Dr. Milus Banister [facility]

## 2020-06-12 NOTE — Assessment & Plan Note (Signed)
Appreciated to second PIP.  Most likely OA, patient reports pain worsens during the day with use, has to rest it to make it feel better.  Occasionally takes Tylenol. -Tramadol 50 every other day as needed severe aches/pains -Recommend patient be seen for joint injection -Continue to follow in the future

## 2020-06-26 ENCOUNTER — Encounter: Payer: Self-pay | Admitting: Family Medicine

## 2020-06-26 ENCOUNTER — Ambulatory Visit (INDEPENDENT_AMBULATORY_CARE_PROVIDER_SITE_OTHER): Payer: Medicaid Other | Admitting: Family Medicine

## 2020-06-26 DIAGNOSIS — Z5329 Procedure and treatment not carried out because of patient's decision for other reasons: Secondary | ICD-10-CM

## 2020-06-26 DIAGNOSIS — Z91199 Patient's noncompliance with other medical treatment and regimen due to unspecified reason: Secondary | ICD-10-CM | POA: Insufficient documentation

## 2020-06-26 NOTE — Progress Notes (Signed)
Telephone call  Called the patient at [phone] and left a HIPAA compliant voice message reminding her of the missed appointments that was scheduled for today 06/26/2020 at 9:30.  Also reminded her of our no-show policy and that it is recommended to call 24 hours before the appointment to cancel/reschedule so that she is not penalized and so that other people can have the appointment slot.  Instructed to call should she have any questions or concerns.  Milus Banister, [place], PGY-3 06/26/2020 10:07 AM

## 2020-07-01 ENCOUNTER — Other Ambulatory Visit: Payer: Self-pay

## 2020-07-01 MED ORDER — METFORMIN HCL 1000 MG PO TABS: 1000.0000 mg | ORAL_TABLET | Freq: Two times a day (BID) | ORAL | 3 refills | Status: DC

## 2020-07-03 ENCOUNTER — Telehealth: Payer: Self-pay

## 2020-07-04 NOTE — Telephone Encounter (Signed)
Sent to PCP ?

## 2020-07-08 ENCOUNTER — Other Ambulatory Visit: Payer: Self-pay | Admitting: Family Medicine

## 2020-07-08 MED ORDER — ATORVASTATIN CALCIUM 20 MG PO TABS: 20.0000 mg | ORAL_TABLET | Freq: Every day | ORAL | 3 refills | Status: DC

## 2020-07-23 ENCOUNTER — Ambulatory Visit (INDEPENDENT_AMBULATORY_CARE_PROVIDER_SITE_OTHER): Payer: Medicaid Other | Admitting: Family Medicine

## 2020-07-23 ENCOUNTER — Encounter: Payer: Self-pay | Admitting: Family Medicine

## 2020-07-23 ENCOUNTER — Other Ambulatory Visit: Payer: Self-pay

## 2020-07-23 VITALS — BP 110/68 | HR 89 | Ht 69.0 in | Wt 238.2 lb

## 2020-07-23 DIAGNOSIS — Z794 Long term (current) use of insulin: Secondary | ICD-10-CM | POA: Diagnosis not present

## 2020-07-23 DIAGNOSIS — I10 Essential (primary) hypertension: Secondary | ICD-10-CM

## 2020-07-23 DIAGNOSIS — E119 Type 2 diabetes mellitus without complications: Secondary | ICD-10-CM | POA: Diagnosis not present

## 2020-07-23 DIAGNOSIS — K594 Anal spasm: Secondary | ICD-10-CM

## 2020-07-23 NOTE — Patient Instructions (Signed)
Thank you for coming in to see [place] today! Please see below to review our plan for today's visit:  1. [email or web address] 2. [facility]: [place] [address], [place], [place],  [address]. [phone]. 3. Follow up in 2 months for Pap Smear and repeat A1c check.  4. Get your COVID shot as soon as possible.   Please call the clinic at [phone] if your symptoms worsen or you have any concerns. It was our pleasure to serve you!   Dr. Milus Banister [facility]

## 2020-07-23 NOTE — Assessment & Plan Note (Signed)
Unsure of cause for rectal pain, concern for intense pains from gassiness.  Patient reports otherwise normal stools, no concern at this time for colon/rectal cancer, other concerning etiology. -Recommend patient follow-up with gastroenterologist for further work-up of pain -Might need new referral, recommend patient get in touch with gastroenterologist and can let us know if she needs referral

## 2020-07-23 NOTE — Assessment & Plan Note (Signed)
Patient reports much better control recently with diet changes.  Is currently taking 10 units every other night, blood sugar this morning 125.  Also taking Metformin 1000 mg twice daily. -Continue Lantus 10 units every other night as patient feels this is working for her.  Also instructed she could take Lantus 5-7 units every day. -Continue Metformin 1000 mg twice daily -Continue healthy diet

## 2020-07-23 NOTE — Assessment & Plan Note (Signed)
Blood pressure today 110/68. -Continue lisinopril 10 mg daily.

## 2020-07-23 NOTE — Progress Notes (Signed)
SUBJECTIVE:   CHIEF COMPLAINT / HPI:   Hypertension: Blood pressure today 110/68.  Currently taking lisinopril 10 mg.  Also reports that she recently changed her diet for the better.  "Pressure in her butt": Patient describes sudden onset of intense "pressure in her rectum", reports it happens rarely (every 3 months), used to last 2-3 minutes, now lasting 6-7 minutes. With this most recent episode she had to kneel down on the floor to feel better, then laid down on the floor to cool down because she felt hot and sweaty. Feels a little better with passing gas. Each time it happens it is more intense than the previous episode. She has hemorrhoids but she says it doesn't feel like those are acting up. She had a colonoscopy and they removed 3 polyps (2020 with Dr. Thornton Park). No abdominal pain, chest pain, no shortness of breath, no leg weakness, diarrhea, blood in stool, no pitch black stools.   Diabetes: Got up to 25 u with Lantus, and now she's taking 10u every other night. Blood sugars have been 150-170 in the morning. No episodes of low blood sugar. Is also taking Metformin 1000mg  BID. Went to eye exam (2 weeks ago, has cataracts), no retinopathy.   Health maintenance: needs Pap smear, 2nd shot COVID shot.   PERTINENT  PMH / PSH:  Patient Active Problem List   Diagnosis Date Noted   No-show for appointment 06/26/2020   Need for Tdap vaccination 06/12/2020   Arthritis of hand, right 06/12/2020   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   Status post right knee replacement 09/12/2018   Unilateral primary osteoarthritis, right knee 08/11/2018   Tenosynovitis of finger 05/26/2017   Arthritis of carpometacarpal [facility]) joint of right thumb 03/10/2017   Essential hypertension 03/04/2017   Depression 03/09/2016   Carpal tunnel syndrome 03/09/2016   Diabetes mellitus, type 2 ([place]) 08/15/2014   Poor dental hygiene 08/15/2014   Sciatica 05/31/2012   OBESITY,  UNSPECIFIED 03/07/2010   TOBACCO ABUSE 03/07/2010   Rectal pain 03/07/2010   PAIN IN JOINT, MULTIPLE SITES 03/07/2010    OBJECTIVE:   BP 110/68    Pulse 89    Ht 5\' 9"  (1.753 m)    Wt 238 lb 4 oz (108.1 kg)    LMP 06/23/2013    SpO2 97%    BMI 35.18 kg/m    Physical exam: General: Pleasant patient, no apparent distress Respiratory: CTA bilaterally, comfortable work of breathing Cardio: RRR, S1-S2 present, no murmurs appreciated Abdomen: Soft, nontender, no masses appreciated, normal bowel sounds   ASSESSMENT/PLAN:   Essential hypertension Blood pressure today 110/68. -Continue lisinopril 10 mg daily.  Diabetes mellitus, type 2 Patient reports much better control recently with diet changes.  Is currently taking 10 units every other night, blood sugar this morning 125.  Also taking Metformin 1000 mg twice daily. -Continue Lantus 10 units every other night as patient feels this is working for her.  Also instructed she could take Lantus 5-7 units every day. -Continue Metformin 1000 mg twice daily -Continue healthy diet  Rectal pain Unsure of cause for rectal pain, concern for intense pains from gassiness.  Patient reports otherwise normal stools, no concern at this time for colon/rectal cancer, other concerning etiology. -Recommend patient follow-up with gastroenterologist for further work-up of pain -Might need new referral, recommend patient get in touch with gastroenterologist and can let us know if she needs referral   Health maintenance: Patient reports [facility]  to [facility] her next Covid shot (had the first on July 13).  We will follow-up in 2 months for repeat HbA1c and for her Pap smear.  [place]

## 2020-07-24 ENCOUNTER — Other Ambulatory Visit: Payer: Self-pay | Admitting: Family Medicine

## 2020-09-02 ENCOUNTER — Other Ambulatory Visit: Payer: Self-pay | Admitting: Family Medicine

## 2020-11-12 ENCOUNTER — Other Ambulatory Visit: Payer: Self-pay

## 2020-11-12 ENCOUNTER — Ambulatory Visit (INDEPENDENT_AMBULATORY_CARE_PROVIDER_SITE_OTHER): Payer: Medicaid Other | Admitting: Family Medicine

## 2020-11-12 ENCOUNTER — Other Ambulatory Visit: Payer: Self-pay | Admitting: Family Medicine

## 2020-11-12 ENCOUNTER — Ambulatory Visit
Admission: RE | Admit: 2020-11-12 | Discharge: 2020-11-12 | Disposition: A | Discharge status: Home / Self Care | Payer: Medicaid Other | Source: Ambulatory Visit | Attending: Family Medicine | Admitting: Family Medicine

## 2020-11-12 VITALS — BP 150/80 | Ht 69.5 in | Wt 238.0 lb

## 2020-11-12 DIAGNOSIS — Z23 Encounter for immunization: Secondary | ICD-10-CM | POA: Diagnosis not present

## 2020-11-12 DIAGNOSIS — E119 Type 2 diabetes mellitus without complications: Secondary | ICD-10-CM

## 2020-11-12 DIAGNOSIS — Z9181 History of falling: Secondary | ICD-10-CM | POA: Insufficient documentation

## 2020-11-12 DIAGNOSIS — Z13220 Encounter for screening for lipoid disorders: Secondary | ICD-10-CM | POA: Diagnosis not present

## 2020-11-12 DIAGNOSIS — Z1231 Encounter for screening mammogram for malignant neoplasm of breast: Secondary | ICD-10-CM | POA: Insufficient documentation

## 2020-11-12 DIAGNOSIS — Z794 Long term (current) use of insulin: Secondary | ICD-10-CM

## 2020-11-12 LAB — POCT GLYCOSYLATED HEMOGLOBIN (HGB A1C): Hemoglobin A1C: 7.7 % — AB (ref 4.0–5.6)

## 2020-11-12 MED ORDER — TRAMADOL HCL 50 MG PO TABS: 50.0000 mg | ORAL_TABLET | Freq: Three times a day (TID) | ORAL | 0 refills | Status: AC | PRN

## 2020-11-12 MED ORDER — CYCLOBENZAPRINE HCL 5 MG PO TABS: 5.0000 mg | ORAL_TABLET | Freq: Every day | ORAL | 0 refills | Status: DC

## 2020-11-12 NOTE — Assessment & Plan Note (Signed)
Patient with history of recent fall flat onto her back on 11/02/2022 on her birthday.  No gross deformity or neurological deficit appreciated on physical exam. -Sending patient for x-rays of lumbar spine and left x-rays -Tramadol 50 mg 1-2 times daily as needed for severe pain -Flexeril 5 mg once daily at night for back spasms

## 2020-11-12 NOTE — Assessment & Plan Note (Signed)
-  Second dose of COVID-19 vaccine administered at today's visit (first dose administered 04/20/2020 at [place], [employer])

## 2020-11-12 NOTE — Progress Notes (Signed)
    SUBJECTIVE:   CHIEF COMPLAINT / HPI:   Fall: Patient presenting to clinic in a recent fall. On her birthday fell straight back while dancing. Denies hitting her head, losing consciousness. Did not call EMS or go to the hospital.  She was able to slowly get up off the floor on her phone and that she kept dancing.  She reports that the day after the injury she was very sore was unable to move very much so she primarily rested in bed.  She said that she feels a little bit better now, has not tried anything to make her pains improved.  She continues to have pains in her low pelvis "near my butt bone", her low back and the left side of her ribs.  Patient also reporting back spasms.  Type 2 diabetes: Patient with diagnosis of type 2 diabetes.  Most recent HbA1c 14.5% in June 2021.  She takes Metformin 1000 mg twice daily and Lantus 10 units once daily. Blood sugars have not been over 200. Blood sugar was 142 this morning.  New HbA1c 7.7%  Health maintenance: Patient due for Pap smear, mammogram, and diabetic eye exam  PERTINENT  PMH / PSH:  Type 2 diabetes, BMI 35, OA, tobacco use disorder, HTN, depression, poor dental hygiene  OBJECTIVE:   BP (!) 150/80   Ht 5' 9.5" (1.765 m)   Wt 238 lb (108 kg)   LMP 06/23/2013   BMI 34.64 kg/m    Physical exam: General: Well-appearing, pleasant patient Spine: Normal upon inspection without ecchymosis, mild tenderness to palpation of paravertebral spinal musculature, no tenderness to palpation on midline, no step-offs appreciated, minimal tenderness appreciated to left ribs upon palpation   ASSESSMENT/PLAN:   Diabetes mellitus, type 2 Patient with significantly better control of her diabetes, HbA1c 14.5% is now 7.7% today. -Continue to take Metformin 1000 mg twice daily and Lantus 10 units, can titrate Lantus up and down depending on whether or not morning HbA1c is greater than or less than 140. -Follow-up 3 months for repeat HbA1c -BMP checked  today -Continuing lisinopril 10 mg for renal protection  Breast cancer screening by mammogram -Order placed for screening mammogram  COVID-19 vaccine administered -Second dose of COVID-19 vaccine administered at today's visit ([facility] [place]  History of recent fall Patient with history of recent fall flat onto her back on 11/02/2022 on her birthday.  No gross deformity or neurological deficit appreciated on physical exam. -Sending patient for x-rays of lumbar spine and left x-rays -Tramadol 50 mg 1-2 times daily as needed for severe pain -Flexeril 5 mg once daily at night for back spasms  Screening for cholesterol level Patient with history of high blood pressure and diabetes, no lipid panel on file.  Patient is currently taking atorvastatin 20 mg daily. -Checking lipid panel today for efficacy of atorvastatin     [place]

## 2020-11-12 NOTE — Assessment & Plan Note (Signed)
Order placed for screening mammogram

## 2020-11-12 NOTE — Assessment & Plan Note (Signed)
Patient with significantly better control of her diabetes, HbA1c 14.5% is now 7.7% today. -Continue to take Metformin 1000 mg twice daily and Lantus 10 units, can titrate Lantus up and down depending on whether or not morning HbA1c is greater than or less than 140. -Follow-up 3 months for repeat HbA1c -BMP checked today -Continuing lisinopril 10 mg for renal protection

## 2020-11-12 NOTE — Assessment & Plan Note (Signed)
Patient with history of high blood pressure and diabetes, no lipid panel on file.  Patient is currently taking atorvastatin 20 mg daily. -Checking lipid panel today for efficacy of atorvastatin

## 2020-11-12 NOTE — Patient Instructions (Addendum)
Thank you for coming in to see [place] today! Please see below to review our plan for today's visit:  1. Please get your mammogram as soon as possible, these should be performed every 2 years to screen for possibly dangerous changes to the breast tissue. You are also due for a visit with your Eye Doctor to assess for any damage to the eye associated with diabetes.  2. Thank you for getting your Flu and COVID Vaccines 3. Please reschedule with [place] for a Pap smear 4. Continue your Metformin twice daily and Lantus 10 units daily.  5. We are checking your cholesterol levels and kidney function today. 6. For your fall you need to get your Xrays of your Spine and ribs. You can take Flexeril 1 tablet at night and take Tramadol 1 tablet up to twice daily as needed for severe pain.   Please call the clinic at [phone] if your symptoms worsen or you have any concerns. It was our pleasure to serve you!   Dr. Milus Banister [facility]

## 2020-11-13 LAB — BASIC METABOLIC PANEL
BUN/Creatinine Ratio: 21 (ref 9–23)
BUN: 16 mg/dL (ref 6–24)
CO2: 22 mmol/L (ref 20–29)
Calcium: 9.5 mg/dL (ref 8.7–10.2)
Chloride: 104 mmol/L (ref 96–106)
Creatinine, Ser: 0.78 mg/dL (ref 0.57–1.00)
GFR calc Af Amer: 98 mL/min/{1.73_m2} (ref >59)
GFR calc non Af Amer: 85 mL/min/{1.73_m2} (ref >59)
Glucose: 197 mg/dL — ABNORMAL HIGH (ref 65–99)
Potassium: 4.7 mmol/L (ref 3.5–5.2)
Sodium: 143 mmol/L (ref 134–144)

## 2020-11-13 LAB — LIPID PANEL
Chol/HDL Ratio: 2.7 ratio (ref 0.0–4.4)
Cholesterol, Total: 137 mg/dL (ref 100–199)
HDL: 50 mg/dL (ref >39)
LDL Chol Calc (NIH): 62 mg/dL (ref 0–99)
Triglycerides: 144 mg/dL (ref 0–149)
VLDL Cholesterol Cal: 25 mg/dL (ref 5–40)

## 2020-11-14 ENCOUNTER — Other Ambulatory Visit: Payer: Self-pay | Admitting: Family Medicine

## 2020-11-14 DIAGNOSIS — S32010A Wedge compression fracture of first lumbar vertebra, initial encounter for closed fracture: Secondary | ICD-10-CM

## 2020-12-13 ENCOUNTER — Other Ambulatory Visit: Payer: Self-pay | Admitting: Family Medicine

## 2020-12-26 ENCOUNTER — Ambulatory Visit: Payer: Medicaid Other

## 2021-01-07 ENCOUNTER — Other Ambulatory Visit: Payer: Self-pay

## 2021-01-07 MED ORDER — LISINOPRIL 10 MG PO TABS: 10.0000 mg | ORAL_TABLET | Freq: Every day | ORAL | 3 refills | Status: DC

## 2021-02-19 ENCOUNTER — Other Ambulatory Visit: Payer: Medicaid Other

## 2021-04-29 ENCOUNTER — Other Ambulatory Visit: Payer: Self-pay

## 2021-04-29 MED ORDER — ATORVASTATIN CALCIUM 20 MG PO TABS: 20.0000 mg | ORAL_TABLET | Freq: Every day | ORAL | 3 refills | Status: DC

## 2021-10-17 ENCOUNTER — Ambulatory Visit: Payer: Medicaid Other | Admitting: Orthopaedic Surgery

## 2021-10-28 ENCOUNTER — Other Ambulatory Visit: Payer: Self-pay

## 2021-10-28 ENCOUNTER — Ambulatory Visit (INDEPENDENT_AMBULATORY_CARE_PROVIDER_SITE_OTHER): Payer: Medicaid Other

## 2021-10-28 ENCOUNTER — Ambulatory Visit: Payer: Self-pay

## 2021-10-28 ENCOUNTER — Ambulatory Visit (INDEPENDENT_AMBULATORY_CARE_PROVIDER_SITE_OTHER): Payer: Medicaid Other | Admitting: Orthopaedic Surgery

## 2021-10-28 DIAGNOSIS — Z96651 Presence of right artificial knee joint: Secondary | ICD-10-CM

## 2021-10-28 DIAGNOSIS — M25561 Pain in right knee: Secondary | ICD-10-CM | POA: Diagnosis not present

## 2021-10-28 DIAGNOSIS — M25562 Pain in left knee: Secondary | ICD-10-CM | POA: Diagnosis not present

## 2021-10-28 NOTE — Progress Notes (Signed)
Office Visit Note   Patient: Melanie Phillips           Date of Birth: October 09, 1966           MRN: [id number] Visit Date: 10/28/2021              Requested by: Lyndee Hensen, DO [address],  [place] [address] PCP: Lyndee Hensen, DO   Assessment & Plan: Visit Diagnoses:  1. Pain in both knees, unspecified chronicity   2. Status post right knee replacement     Plan: Based on findings impression is patellofemoral pain quadriceps weakness.  I recommended and she attend outpatient physical therapy to work on quadricep strengthening as well as make all efforts at significant weight loss which will also help with the pain.  Reassurance was provided that the knee replacement is without complication.  Questions encouraged and answered.  Follow-up as needed.  Follow-Up Instructions: No follow-ups on file.   Orders:  Orders Placed This Encounter  Procedures   XR Knee 1-2 Views Right   XR Knee 1-2 Views Left   Ambulatory referral to Physical Therapy   No orders of the defined types were placed in this encounter.     Procedures: No procedures performed   Clinical Data: No additional findings.   Subjective: Chief Complaint  Patient presents with   Right Knee - Pain   Left Knee - Pain    Melanie Phillips is patient mine who underwent right total knee replacement in 2019.  She feels a cracking sensation behind the patella.  She has pain and the symptoms when she gets up from a seated position.  Denies any injuries.  Denies any swelling.   Review of Systems  Constitutional: Negative.   HENT: Negative.    Eyes: Negative.   Respiratory: Negative.    Cardiovascular: Negative.   Endocrine: Negative.   Musculoskeletal: Negative.   Neurological: Negative.   Hematological: Negative.   Psychiatric/Behavioral: Negative.    All other systems reviewed and are negative.   Objective: Vital Signs: LMP 06/23/2013   Physical Exam Vitals and nursing note reviewed.   Constitutional:      Appearance: She is well-developed.  Pulmonary:     Effort: Pulmonary effort is normal.  Skin:    General: Skin is warm.     Capillary Refill: Capillary refill takes less than 2 seconds.  Neurological:     Mental Status: She is alert and oriented to person, place, and time.  Psychiatric:        Behavior: Behavior normal.        Thought Content: Thought content normal.        Judgment: Judgment normal.    Ortho Exam  Right knee shows a fully healed surgical scar.  Patella tracking is normal.  1+ crepitus with range of motion.  Collaterals are stable.  No joint effusion.  Specialty Comments:  No specialty comments available.  Imaging: XR Knee 1-2 Views Left  Result Date: 10/28/2021 Moderate osteoarthritis.  Periarticular spurring  XR Knee 1-2 Views Right  Result Date: 10/28/2021 Stable total knee replacement in good alignment     PMFS History: Patient Active Problem List   Diagnosis Date Noted   Pain in both knees 10/28/2021   Compression fracture of L1 lumbar vertebra ([place]) 11/14/2020   Screening for cholesterol level 11/12/2020   History of recent fall 11/12/2020   Breast cancer screening by mammogram 11/12/2020   COVID-19 vaccine administered 11/12/2020   No-show for appointment  06/26/2020   Need for Tdap vaccination 06/12/2020   Arthritis of hand, right 06/12/2020   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   Status post right knee replacement 09/12/2018   Unilateral primary osteoarthritis, right knee 08/11/2018   Tenosynovitis of finger 05/26/2017   Arthritis of carpometacarpal [facility]) joint of right thumb 03/10/2017   Essential hypertension 03/04/2017   Depression 03/09/2016   Carpal tunnel syndrome 03/09/2016   Diabetes mellitus, type 2 ([place]) 08/15/2014   Poor dental hygiene 08/15/2014   Sciatica 05/31/2012   OBESITY, UNSPECIFIED 03/07/2010   TOBACCO ABUSE 03/07/2010   Rectal pain 03/07/2010   PAIN IN JOINT, MULTIPLE SITES  03/07/2010   Past Medical History:  Diagnosis Date   Anxiety    Arthritis    Back pain    Depression    Diabetes mellitus without complication (HCC)    GERD (gastroesophageal reflux disease)    Hyperlipidemia    Hypertension     Family History  Problem Relation Age of Onset   Diabetes Mother    Diabetes Sister    Colon cancer Neg Hx    Colon polyps Neg Hx    Heart disease Neg Hx    Rectal cancer Neg Hx    Stomach cancer Neg Hx     Past Surgical History:  Procedure Laterality Date   CARPAL TUNNEL RELEASE Right 03/10/2017   Procedure: CARPAL TUNNEL RELEASE;  Surgeon: Leandrew Koyanagi, MD;  Location: [place];  Service: Orthopedics;  Laterality: Right;   CARPOMETACARPEL SUSPENSION PLASTY Right 03/10/2017   Procedure: Right thumb Ligament Reconstruction Tendon Interposition, Right Carpal tunnel release;  Surgeon: Leandrew Koyanagi, MD;  Location: [place];  Service: Orthopedics;  Laterality: Right;   JOINT REPLACEMENT     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   Procedure: RIGHT TOTAL KNEE ARTHROPLASTY;  Surgeon: Leandrew Koyanagi, MD;  Location: [place];  Service: Orthopedics;  Laterality: Right;   TRIGGER FINGER RELEASE Left 05/26/2017   Procedure: RELEASE TRIGGER FINGER LEFT THUMB;  Surgeon: Leandrew Koyanagi, MD;  Location: [place];  Service: Orthopedics;  Laterality: Left;   TRIGGER FINGER RELEASE Right 02/23/2018   Procedure: RELEASE RIGHT TRIGGER THUMB;  Surgeon: Leandrew Koyanagi, MD;  Location: [place];  Service: Orthopedics;  Laterality: Right;  Bier block   TUBAL LIGATION     Social History   Occupational History   Not on file  Tobacco Use   Smoking status: Every Day    Packs/day: 0.30    Years: 33.00    Pack years: 9.90    Types: Cigarettes   Smokeless tobacco: Never  Vaping Use   Vaping Use: Never used  Substance and Sexual Activity   Alcohol use: Yes    Alcohol/week: 0.0 standard drinks    Comment: rarely    Drug use: No   Sexual activity: Yes    Birth control/protection: Surgical

## 2021-11-12 ENCOUNTER — Other Ambulatory Visit: Payer: Self-pay

## 2021-11-12 MED ORDER — INSULIN GLARGINE 100 UNIT/ML SOLOSTAR PEN: 10.0000 [IU] | PEN_INJECTOR | Freq: Every day | SUBCUTANEOUS | 1 refills | Status: DC

## 2021-11-15 NOTE — Therapy (Incomplete)
OUTPATIENT PHYSICAL THERAPY LOWER EXTREMITY EVALUATION   Patient Name: Melanie Phillips MRN: [id number] DOB:11/26/1966, 55 y.o., female Today's Date: 11/15/2021    Past Medical History:  Diagnosis Date   Anxiety    Arthritis    Back pain    Depression    Diabetes mellitus without complication (HCC)    GERD (gastroesophageal reflux disease)    Hyperlipidemia    Hypertension    Past Surgical History:  Procedure Laterality Date   CARPAL TUNNEL RELEASE Right 03/10/2017   Procedure: CARPAL TUNNEL RELEASE;  Surgeon: Melanie Koyanagi, MD;  Location: [place];  Service: Orthopedics;  Laterality: Right;   CARPOMETACARPEL SUSPENSION PLASTY Right 03/10/2017   Procedure: Right thumb Ligament Reconstruction Tendon Interposition, Right Carpal tunnel release;  Surgeon: Melanie Koyanagi, MD;  Location: [place];  Service: Orthopedics;  Laterality: Right;   JOINT REPLACEMENT     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   Procedure: RIGHT TOTAL KNEE ARTHROPLASTY;  Surgeon: Melanie Koyanagi, MD;  Location: [place];  Service: Orthopedics;  Laterality: Right;   TRIGGER FINGER RELEASE Left 05/26/2017   Procedure: RELEASE TRIGGER FINGER LEFT THUMB;  Surgeon: Melanie Koyanagi, MD;  Location: [place];  Service: Orthopedics;  Laterality: Left;   TRIGGER FINGER RELEASE Right 02/23/2018   Procedure: RELEASE RIGHT TRIGGER THUMB;  Surgeon: Melanie Koyanagi, MD;  Location: [place];  Service: Orthopedics;  Laterality: Right;  Bier block   TUBAL LIGATION     Patient Active Problem List   Diagnosis Date Noted   Pain in both knees 10/28/2021   Compression fracture of L1 lumbar vertebra ([place]) 11/14/2020   Screening for cholesterol level 11/12/2020   History of recent fall 11/12/2020   Breast cancer screening by mammogram 11/12/2020   COVID-19 vaccine administered 11/12/2020   No-show for appointment 06/26/2020   Need for Tdap vaccination 06/12/2020    Arthritis of hand, right 06/12/2020   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   Status post right knee replacement 09/12/2018   Unilateral primary osteoarthritis, right knee 08/11/2018   Tenosynovitis of finger 05/26/2017   Arthritis of carpometacarpal (CMC) joint of right thumb 03/10/2017   Essential hypertension 03/04/2017   Depression 03/09/2016   Carpal tunnel syndrome 03/09/2016   Diabetes mellitus, type 2 ([place]) 08/15/2014   Poor dental hygiene 08/15/2014   Sciatica 05/31/2012   OBESITY, UNSPECIFIED 03/07/2010   TOBACCO ABUSE 03/07/2010   Rectal pain 03/07/2010   PAIN IN JOINT, MULTIPLE SITES 03/07/2010    PCP: Melanie Hensen, DO  REFERRING PROVIDER: Leandrew Koyanagi, MD  REFERRING DIAG: Pain in both knees, unspecified chronicity [M25.561, M25.562], Status post right knee replacement [Z96.651]  THERAPY DIAG:  No diagnosis found.  ONSET DATE: 2019  SUBJECTIVE:  Melanie Phillips is a 55 y.o. female who presents to clinic with chief complaint of ***.  MOI/History of condition:  ***  From referring provider: "Melanie Phillips is patient mine who underwent right total knee replacement in 2019.  She feels a cracking sensation behind the patella.  She has pain and the symptoms when she gets up from a seated position.  Denies any injuries.  Denies any swelling."   Red flags:  {has/denies:26543} {kerredflag:26542}  Pertinent past history:  ***  Pain:  Are you having pain? {yes/no:20286} Pain location: *** NPRS scale:  highest {NUMBERS; 0-10:5044}/10 current {NUMBERS; 0-10:5044}/10  best {NUMBERS; 0-10:5044}/10 Aggravating factors: *** Relieving factors: *** Pain description: {PAIN DESCRIPTION:21022940} Severity: {Desc; low/moderate/high:110033} Irritability: {Desc;  low/moderate/high:110033} Stage: {Desc; acute/subacute/chronic:13799} Stability: {kerbetterworse:26715} 24 hour pattern: ***   Occupation: ***  Hobbies/Recreation: ***  Assistive Device: ***  Hand Dominance: ***  Patient Goals ***   PRECAUTIONS: {Therapy precautions:24002}  WEIGHT BEARING RESTRICTIONS {Yes ***/No:24003}  FALLS:  Has patient fallen in last 6 months? {yes/no:20286}, Number of falls: ***  LIVING ENVIRONMENT: Lives with: {[facility] lives with:25569::"lives with their family"} Stairs: {yes/no:20286}; {Stairs:24000}  PLOF: {PLOF:24004}  DIAGNOSTIC FINDINGS: ***   OBJECTIVE:    GENERAL OBSERVATION:   ***  SENSATION:  Light touch: {intact/deficits:24005}  MUSCLE LENGTH: Hamstrings: Right *** deg; Left *** deg Thomas test: Right (***); Left (***) Ely's test: Right (***); Left (***) Ober's test: Right (***); Left (***)  LE ROM:  ROM Right 11/15/2021 Left 11/15/2021  Hip flexion    Hip extension    Hip abduction    Hip adduction    Hip internal rotation    Hip external rotation    Knee flexion *** ***  Knee extension *** ***  Ankle dorsiflexion    Ankle plantarflexion    Ankle inversion    Ankle eversion     (Blank rows = not tested)  LE MMT:  MMT Right 11/15/2021 Left 11/15/2021  Hip flexion    Knee extension    Knee flexion    Hip abduction    Hip extension    Hip external rotation    Hip internal rotation    Hip adduction    Ankle dorsiflexion    Ankle plantarflexion    Ankle inversion    Ankle eversion     (Blank rows = not tested; all scores listed out of a possible 5)   LOWER EXTREMITY SPECIAL TESTS:  Knee special tests: {KNEE SPECIAL TESTS:26240}  FUNCTIONAL TESTS:  10 m max gait speed: ***'', *** m/s, AD: ***  30'' STS: ***x    UE used? ***  GAIT: Comments: ***  PATIENT SURVEYS:  {rehab surveys:24030}    TODAY'S TREATMENT: ***   PATIENT EDUCATION:  POC, diagnosis, prognosis, HEP, and outcome measures.   Pt educated via explanation, demonstration, and handout (HEP).  Pt confirms understanding verbally.    HOME EXERCISE PROGRAM: ***  ASSESSMENT:  CLINICAL IMPRESSION: Melanie Phillips is a 55 y.o. female who presents to clinic with signs and sxs consistent with ***.  Patient presents with pain and impairments/deficits in: ***.  Activity limitations include: ***.  Participation limitations include: ***.  Patient will benefit from skilled therapy to address pain and the listed deficits in order to achieve functional goals, enable safety and independence in completion of daily tasks, and return to PLOF.   REHAB POTENTIAL: {rehabpotential:25112}  CLINICAL DECISION MAKING: {clinical decision making:25114}  EVALUATION COMPLEXITY: {Evaluation complexity:25115}   GOALS: Goals reviewed with patient? Yes  SHORT TERM GOALS:  STG Name Target Date Goal status  1 Jeniyah will be >75% HEP compliant to improve carryover between sessions and facilitate independent management of condition  Baseline: No HEP 12/06/2021 INITIAL   LONG TERM GOALS:   LTG Name Target Date Goal status  1 *** 01/10/2022 INITIAL  2 *** 01/10/2022 INITIAL  3 *** 01/10/2022 INITIAL  4 *** 01/10/2022 INITIAL  5 *** 01/10/2022 INITIAL  6 *** 01/10/2022 INITIAL  7 *** 01/10/2022 INITIAL   PLAN: PT FREQUENCY: 1-2x/week  PT DURATION: 8 weeks (Ending 01/10/2022)  PLANNED INTERVENTIONS: Therapeutic exercises, Therapeutic activity, Neuro Muscular re-education, Gait training, Patient/Family education, Joint mobilization, Dry Needling, Electrical stimulation, Spinal mobilization and/or manipulation, Moist heat, Taping, Vasopneumatic device, Ionotophoresis 4mg /ml Dexamethasone, and Manual therapy  PLAN FOR NEXT SESSION: ***   Shearon Balo PT, DPT 11/15/2021, 9:33 AM

## 2021-11-18 ENCOUNTER — Ambulatory Visit: Payer: Medicaid Other | Attending: Orthopaedic Surgery | Admitting: Physical Therapy

## 2021-11-19 ENCOUNTER — Other Ambulatory Visit: Payer: Self-pay

## 2021-11-19 ENCOUNTER — Ambulatory Visit (INDEPENDENT_AMBULATORY_CARE_PROVIDER_SITE_OTHER): Payer: Medicaid Other

## 2021-11-19 ENCOUNTER — Ambulatory Visit (INDEPENDENT_AMBULATORY_CARE_PROVIDER_SITE_OTHER): Payer: Medicaid Other | Admitting: Family Medicine

## 2021-11-19 VITALS — BP 135/104 | HR 97 | Ht 69.5 in | Wt 241.4 lb

## 2021-11-19 DIAGNOSIS — M79671 Pain in right foot: Secondary | ICD-10-CM | POA: Diagnosis not present

## 2021-11-19 DIAGNOSIS — J310 Chronic rhinitis: Secondary | ICD-10-CM

## 2021-11-19 DIAGNOSIS — Z23 Encounter for immunization: Secondary | ICD-10-CM

## 2021-11-19 DIAGNOSIS — Z794 Long term (current) use of insulin: Secondary | ICD-10-CM | POA: Diagnosis not present

## 2021-11-19 DIAGNOSIS — E119 Type 2 diabetes mellitus without complications: Secondary | ICD-10-CM

## 2021-11-19 DIAGNOSIS — I10 Essential (primary) hypertension: Secondary | ICD-10-CM

## 2021-11-19 DIAGNOSIS — J329 Chronic sinusitis, unspecified: Secondary | ICD-10-CM | POA: Diagnosis not present

## 2021-11-19 LAB — POCT GLYCOSYLATED HEMOGLOBIN (HGB A1C): HbA1c, POC (controlled diabetic range): 7.3 % — AB (ref 0.0–7.0)

## 2021-11-19 MED ORDER — ROSUVASTATIN CALCIUM 10 MG PO TABS
10.0000 mg | ORAL_TABLET | Freq: Every day | ORAL | 3 refills | Status: DC
Start: 2021-11-19 — End: 2022-10-06

## 2021-11-19 MED ORDER — AMOXICILLIN 875 MG PO TABS: 875.0000 mg | ORAL_TABLET | Freq: Two times a day (BID) | ORAL | 0 refills | Status: AC

## 2021-11-19 NOTE — Progress Notes (Signed)
° ° °  SUBJECTIVE:   Chief compliant/HPI: annual examination  Melanie Phillips is a 55 y.o. who presents for diabetes follow up.   Feels like a "bone" or something sticking in the middle of her arch for the past couple of months.  Pain shoots through her right foot.  Took nothing for pain.  Walking in the morning makes it worse.   In her left ear she can hear a "roaring" and loud noise after blowing her nose for the past 6 months. No loss of hearing.  Feels like something in blocking her eardrum. Denies ear pain, jaw pain. Has some maxillary pain. No previous similar symptoms.   Taking metformin, CBG Lantus 5-10 units based on her blood sugars. Home blood sugars range 150 - 119.   OBJECTIVE:   BP (!) 135/104    Pulse 97    Ht 5' 9.5" (1.765 m)    Wt 241 lb 6.4 oz (109.5 kg)    LMP 06/23/2013    SpO2 97%    BMI 35.14 kg/m    GEN:     alert, well appearing and no distress    HENT:  :  mucus membranes moist, oropharyngeal without lesions or erythema, nares patent, clear nasal discharge, bilateral TM opaque  EYES:   pupils equal and reactive, no scleral injection NECK:  normal ROM, no lymphadenopathy  RESP:  no increased work of breathing  CVS:   regular rate  MSK:   right foot, tender 1 cm mass that moves with movement of right great toe, good DP pulse, dry skin on right foot, onychomycosis      ASSESSMENT/PLAN:   Diabetes mellitus, type 2 Today a1c 7.3  from  7.7% last year. Previously a1c 14.5.  -Continue to take Metformin 1000 mg twice daily and take Lantus 10 units at bedtime  -Follow-up 3 months for repeat HbA1c -BMP and lipid panel today -Continuing lisinopril 10 mg for renal protection -Switch to Crestor. Discontinue Lipitor  - Encouraged continued diet rich in vegetables and complex carbs. Counseled on need to continue exercising.   Statin therapy: crestor ACEi/ARB: Lisinopril  Urine microalbumine: Needs at follow up  Eye exam: advise to get    Essential  hypertension BP not at goal. Advised to pick up refill at pharmacy.  - BMP today     Rhinosinusitis  Treat with amoxicillin for 10 days. Follow up after treatment if not improving.  Consider ENT referral for hearing testing.   Right foot pain Suspect ganglionic cyst. Pt would like to avoid ICS injection. Referral to podiatry.     Vaccines: pneumonia, influenza and COVID booster given today   Follow up 1/16 at 11:30 AM scheduled.

## 2021-11-19 NOTE — Assessment & Plan Note (Addendum)
Today a1c 7.3  from  7.7% last year. Previously a1c 14.5.  -Continue to take Metformin 1000 mg twice daily and take Lantus 10 units at bedtime  -Follow-up 3 months for repeat HbA1c -BMP and lipid panel today -Continuing lisinopril 10 mg for renal protection -Switch to Crestor. Discontinue Lipitor  - Encouraged continued diet rich in vegetables and complex carbs. Counseled on need to continue exercising.   Statin therapy: crestor ACEi/ARB: Lisinopril  Urine microalbumine: Needs at follow up  Eye exam: advise to get

## 2021-11-19 NOTE — Patient Instructions (Signed)
It was great seeing you today!  Scheduled for January 16th at 1130 PM.   Visit Remembers: - Stop by the pharmacy to pick up your prescriptions  - Continue to work on your healthy eating habits and incorporating exercise into your daily life.  - Your goal is to have an BP < 120/80 - Medicine Changes: Lantus 10 units daily with food.  Stop taking Lipitor and switch to Crestor.    Regarding lab work today:  Due to recent changes in healthcare laws, you may see the results of your imaging and laboratory studies on MyChart before your provider has had a chance to review them.  I understand that in some cases there may be results that are confusing or concerning to you. Not all laboratory results come back in the same time frame and you may be waiting for multiple results in order to interpret others.  Please give [place] 72 hours in order for your provider to thoroughly review all the results before contacting the office for clarification of your results. If everything is normal, you will get a letter in the mail or a message in My Chart. Please give [place] a call if you do not hear from [place] after 2 weeks.  Please bring all of your medications with you to each visit.    If you haven't already, sign up for My Chart to have easy access to your labs results, and communication with your primary care physician.  Feel free to call with any questions or concerns at any time, at [phone].   Take care,  Dr. Rushie [facility]

## 2021-11-19 NOTE — Assessment & Plan Note (Signed)
BP not at goal. Advised to pick up refill at pharmacy.  - BMP today

## 2021-11-20 LAB — BASIC METABOLIC PANEL
BUN/Creatinine Ratio: 16 (ref 9–23)
BUN: 11 mg/dL (ref 6–24)
CO2: 24 mmol/L (ref 20–29)
Calcium: 9.2 mg/dL (ref 8.7–10.2)
Chloride: 102 mmol/L (ref 96–106)
Creatinine, Ser: 0.68 mg/dL (ref 0.57–1.00)
Glucose: 112 mg/dL — ABNORMAL HIGH (ref 70–99)
Potassium: 4.8 mmol/L (ref 3.5–5.2)
Sodium: 141 mmol/L (ref 134–144)
eGFR: 102 mL/min/{1.73_m2} (ref >59)

## 2021-12-10 ENCOUNTER — Other Ambulatory Visit: Payer: Self-pay | Admitting: *Deleted

## 2021-12-11 ENCOUNTER — Other Ambulatory Visit: Payer: Self-pay | Admitting: Family Medicine

## 2021-12-11 ENCOUNTER — Ambulatory Visit: Payer: Medicaid Other | Admitting: Sports Medicine

## 2021-12-12 ENCOUNTER — Telehealth: Payer: Self-pay

## 2021-12-13 MED ORDER — ACCU-CHEK SMARTVIEW VI STRP: ORAL_STRIP | 12 refills | Status: DC

## 2021-12-13 MED ORDER — BD PEN NEEDLE NANO 2ND GEN 32G X 4 MM MISC: 1.0000 [IU] | Freq: Three times a day (TID) | 2 refills | Status: AC

## 2021-12-13 MED ORDER — INSULIN GLARGINE 100 UNIT/ML SOLOSTAR PEN
10.0000 [IU] | PEN_INJECTOR | Freq: Every day | SUBCUTANEOUS | 1 refills | Status: DC
Start: 2021-12-13 — End: 2023-02-11

## 2021-12-15 ENCOUNTER — Ambulatory Visit: Payer: Medicaid Other | Admitting: Family Medicine

## 2021-12-22 ENCOUNTER — Other Ambulatory Visit: Payer: Self-pay

## 2021-12-22 ENCOUNTER — Encounter: Payer: Self-pay | Admitting: Family Medicine

## 2021-12-22 ENCOUNTER — Other Ambulatory Visit (HOSPITAL_COMMUNITY)
Admission: RE | Admit: 2021-12-22 | Discharge: 2021-12-22 | Disposition: A | Discharge status: Home / Self Care | Payer: Medicaid Other | Source: Ambulatory Visit | Attending: Family Medicine | Admitting: Family Medicine

## 2021-12-22 ENCOUNTER — Ambulatory Visit (INDEPENDENT_AMBULATORY_CARE_PROVIDER_SITE_OTHER): Payer: Medicaid Other | Admitting: Family Medicine

## 2021-12-22 VITALS — BP 134/81 | HR 106 | Ht 69.5 in | Wt 247.0 lb

## 2021-12-22 DIAGNOSIS — R490 Dysphonia: Secondary | ICD-10-CM | POA: Diagnosis not present

## 2021-12-22 DIAGNOSIS — Z124 Encounter for screening for malignant neoplasm of cervix: Secondary | ICD-10-CM

## 2021-12-22 DIAGNOSIS — Z1231 Encounter for screening mammogram for malignant neoplasm of breast: Secondary | ICD-10-CM | POA: Diagnosis not present

## 2021-12-22 DIAGNOSIS — I10 Essential (primary) hypertension: Secondary | ICD-10-CM | POA: Diagnosis not present

## 2021-12-22 MED ORDER — OLMESARTAN MEDOXOMIL 5 MG PO TABS: 10.0000 mg | ORAL_TABLET | Freq: Every day | ORAL | 1 refills | Status: DC

## 2021-12-22 NOTE — Patient Instructions (Signed)
Stop by the pharmacy to pick up your prescriptions. Stop taking Lisinopril.  Your new blood pressure medication is olmesartan.  We will need to check your kidney function again.   Be sure to schedule your mammogram.   I recommend 150 minutes of exercise per week-try 30 minutes 5 days per week Reduce the number of sugary beverages (like soda and juice) and increase leafy greens and whole fruits.  We discussed avoiding tobacco and alcohol.  I recommend avoiding illicit substances.  Your blood pressure goal is <120/ 80 .    Take Care,   Dr Susa Simmonds

## 2021-12-22 NOTE — Progress Notes (Signed)
° °  SUBJECTIVE:   CHIEF COMPLAINT / HPI:    Melanie Phillips is a 56 y.o. female here for blood pressure follow up. Pt concerned about her BP medications as she just had to take her partner to the hospital for facial swelling related to Lisinopril.   She has been hoarse since New Years. Has moments where nothing comes out. She states symptoms are improving. She smokes 1/4 ppd.    PERTINENT  PMH / PSH: reviewed and updated as appropriate   OBJECTIVE:   BP 134/81    Pulse (!) 106    Ht 5' 9.5" (1.765 m)    Wt 247 lb (112 kg)    LMP 06/23/2013    SpO2 96%    BMI 35.95 kg/m    GEN: pleasant well appearing female, in no acute distress  HENT: fluent speech, occasional loss of words related to her hoarseness  CV: regular rate and rhythm RESP: no increased work of breathing, clear to ascultation bilaterally MSK: no LE edema SKIN: warm, dry   ASSESSMENT/PLAN:   Hoarse voice quality Improving. Possibly voice box injury related to over-use during party at [employer]. Discussed watchful waiting given sx are improving. Follow up scheduled. History of tobacco use (10 pack years).  May need laryngoscopy and ENT referral.   Essential hypertension Discontinue Lisinopril. Start 10 mg Olmesartan. Future BMP ordered.   Health Care Maintenance  -She had negative PAP in April 2018 though endocervical/transformation zone component was absent.  HPV negative. Repeated PAP today.  -Patient has not yet scheduled her mammogram. Advised to schedule.   Lyndee Hensen, DO PGY-3, [facility] 12/22/2021

## 2021-12-23 LAB — CYTOLOGY - PAP
Chlamydia: NEGATIVE
Comment: NEGATIVE
Comment: NEGATIVE
Comment: NEGATIVE
Comment: NORMAL
Diagnosis: NEGATIVE
High risk HPV: NEGATIVE
Neisseria Gonorrhea: NEGATIVE
Trichomonas: NEGATIVE

## 2021-12-24 DIAGNOSIS — R490 Dysphonia: Secondary | ICD-10-CM | POA: Insufficient documentation

## 2021-12-24 NOTE — Assessment & Plan Note (Signed)
Discontinue Lisinopril. Start 10 mg Olmesartan. Future BMP ordered.

## 2021-12-24 NOTE — Assessment & Plan Note (Addendum)
Improving. Possibly voice box injury related to over-use during party at [employer]. Discussed watchful waiting given sx are improving. Follow up scheduled. History of tobacco use (10 pack years).  May need laryngoscopy and ENT referral.

## 2021-12-25 ENCOUNTER — Other Ambulatory Visit: Payer: Self-pay

## 2021-12-25 ENCOUNTER — Encounter: Payer: Self-pay | Admitting: Emergency Medicine

## 2021-12-25 ENCOUNTER — Ambulatory Visit
Admission: EM | Admit: 2021-12-25 | Discharge: 2021-12-25 | Disposition: A | Discharge status: Home / Self Care | Payer: Medicaid Other | Attending: Nurse Practitioner | Admitting: Nurse Practitioner

## 2021-12-25 DIAGNOSIS — I10 Essential (primary) hypertension: Secondary | ICD-10-CM | POA: Diagnosis not present

## 2021-12-25 DIAGNOSIS — M25532 Pain in left wrist: Secondary | ICD-10-CM

## 2021-12-25 MED ORDER — OXYCODONE-ACETAMINOPHEN 5-325 MG PO TABS: 1.0000 | ORAL_TABLET | Freq: Four times a day (QID) | ORAL | 0 refills | Status: DC | PRN

## 2021-12-25 MED ORDER — KETOROLAC TROMETHAMINE 60 MG/2ML IM SOLN
60.0000 mg | Freq: Once | INTRAMUSCULAR | Status: AC
  Administered 2021-12-25: 60 mg via INTRAMUSCULAR

## 2021-12-25 MED ORDER — PREDNISONE 10 MG (21) PO TBPK: ORAL_TABLET | Freq: Every day | ORAL | 0 refills | Status: DC

## 2021-12-25 MED ORDER — KETOROLAC TROMETHAMINE 10 MG PO TABS: 10.0000 mg | ORAL_TABLET | Freq: Four times a day (QID) | ORAL | 0 refills | Status: DC | PRN

## 2021-12-25 MED ORDER — CEPHALEXIN 500 MG PO CAPS: 500.0000 mg | ORAL_CAPSULE | Freq: Three times a day (TID) | ORAL | 0 refills | Status: AC

## 2021-12-25 NOTE — ED Triage Notes (Signed)
Patient states that she woke up with her left wrist swelling and extremely painful.  Pt unsure if something bit it, possible insect bite.  Denies any OTC meds.

## 2021-12-25 NOTE — Discharge Instructions (Addendum)
There are many things that can cause wrist pain without injury. It could be due to arthritis, using the joint too much or infection from an insect bite.   I am not sure why your wrist is hurting.   We are testing your blood to check for inflammation. If these are elevated, then your pain could be due to arthritis or overuse.   You also have a small mark on your wrist which could be from a bug bite. I have provided you with antibiotics to cover for possible infection   Take medications as prescribed. Use the splint for comfort. Do not get the splint wet. You can take it off for baths and sleeping. Keep your arm elevated and ice the area at least three times a day.   Follow-up with your orthopedic physician next week. Call tomorrow to make an appointment   Your blood pressure was elevated today. It could be due to you being in so much pain. Continue to take your blood pressure mediations as prescribed. Monitor your blood pressure closely. Go to the ED immediately if you begin to experience any of the symptoms we discussed  Go to the ED immediately if:  You lose feeling in your fingers or hand. Your fingers turn white, very red, or cold and blue. You cannot move your fingers.

## 2021-12-25 NOTE — ED Provider Notes (Addendum)
[facility]    CSN: [id number] Arrival date & time: 12/25/21  1154      History   Chief Complaint Chief Complaint  Patient presents with   Joint Swelling    HPI Melanie Phillips is a 56 y.o. female.   History of Present Illness  Melanie Phillips is a 56 y.o. female the patient complains of acute pain in the left hand and wrist. Onset of symptoms was abrupt starting several hours ago.  Patient noticed symptoms earlier this morning which started off to be mild and then progressively got worse.  She denies any injury thinks that she may have been bit by something.  Patient describes pain as aching, sharp/stabbing, and throbbing. Pain severity now is 10 /10. The pain radiates into the forearm.  She also has swelling in the hand and decreased range of motion due to the pain.  Pain is aggravated by movement, use, and palpation. Pain is alleviated by nothing.  Denies any numbness, tingling, weakness, loss of sensation or loss of motion.      Past Medical History:  Diagnosis Date   Anxiety    Arthritis    Back pain    Depression    Diabetes mellitus without complication ([place])    GERD (gastroesophageal reflux disease)    Hyperlipidemia    Hypertension     Patient Active Problem List   Diagnosis Date Noted   Hoarse voice quality 12/24/2021   Pain in both knees 10/28/2021   Compression fracture of L1 lumbar vertebra ([place]) 11/14/2020   Screening for cholesterol level 11/12/2020   History of recent fall 11/12/2020   Breast cancer screening by mammogram 11/12/2020   COVID-19 vaccine administered 11/12/2020   No-show for appointment 06/26/2020   Need for Tdap vaccination 06/12/2020   Arthritis of hand, right 06/12/2020   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   Status post right knee replacement 09/12/2018   Unilateral primary osteoarthritis, right knee 08/11/2018   Tenosynovitis of finger 05/26/2017   Arthritis of carpometacarpal [facility]) joint of right thumb  03/10/2017   Essential hypertension 03/04/2017   Depression 03/09/2016   Carpal tunnel syndrome 03/09/2016   Diabetes mellitus, type 2 ([place]) 08/15/2014   Poor dental hygiene 08/15/2014   Sciatica 05/31/2012   OBESITY, UNSPECIFIED 03/07/2010   TOBACCO ABUSE 03/07/2010   Rectal pain 03/07/2010   PAIN IN JOINT, MULTIPLE SITES 03/07/2010    Past Surgical History:  Procedure Laterality Date   CARPAL TUNNEL RELEASE Right 03/10/2017   Procedure: CARPAL TUNNEL RELEASE;  Surgeon: Leandrew Koyanagi, MD;  Location: [place];  Service: Orthopedics;  Laterality: Right;   CARPOMETACARPEL SUSPENSION PLASTY Right 03/10/2017   Procedure: Right thumb Ligament Reconstruction Tendon Interposition, Right Carpal tunnel release;  Surgeon: Leandrew Koyanagi, MD;  Location: [place];  Service: Orthopedics;  Laterality: Right;   JOINT REPLACEMENT     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   Procedure: RIGHT TOTAL KNEE ARTHROPLASTY;  Surgeon: Leandrew Koyanagi, MD;  Location: [place];  Service: Orthopedics;  Laterality: Right;   TRIGGER FINGER RELEASE Left 05/26/2017   Procedure: RELEASE TRIGGER FINGER LEFT THUMB;  Surgeon: Leandrew Koyanagi, MD;  Location: [place];  Service: Orthopedics;  Laterality: Left;   TRIGGER FINGER RELEASE Right 02/23/2018   Procedure: RELEASE RIGHT TRIGGER THUMB;  Surgeon: Leandrew Koyanagi, MD;  Location: [place];  Service: Orthopedics;  Laterality: Right;  Bier block   TUBAL LIGATION  OB History     Gravida  6   Para      Term      Preterm      AB      Living  6      SAB      IAB      Ectopic      Multiple      Live Births               Home Medications    Prior to Admission medications   Medication Sig Start Date End Date Taking? Authorizing Provider  Accu-Chek FastClix Lancets MISC Use as needed to check blood sugar. 05/28/20  Yes Nuala Alpha, MD  Blood Glucose Monitoring Suppl (ACCU-CHEK  GUIDE) w/Device KIT 1 Device by Does not apply route in the morning and at bedtime. 05/28/20  Yes Nuala Alpha, MD  cephALEXin (KEFLEX) 500 MG capsule Take 1 capsule (500 mg total) by mouth 3 (three) times daily for 7 days. 12/25/21 01/01/22 Yes Enrique Sack, FNP  cyclobenzaprine (FLEXERIL) 5 MG tablet Take 1 tablet (5 mg total) by mouth at bedtime. 11/12/20  Yes Milus Banister C, DO  glucose blood (ACCU-CHEK SMARTVIEW) test strip Use as instructed 12/13/21  Yes Brimage, Vondra, DO  insulin glargine (LANTUS) 100 UNIT/ML Solostar Pen Inject 10 Units into the skin at bedtime. 12/13/21  Yes Brimage, Vondra, DO  Insulin Pen Needle (BD PEN NEEDLE NANO 2ND GEN) 32G X 4 MM MISC 1 Units by Does not apply route 4 (four) times daily -  before meals and at bedtime. 12/13/21  Yes Brimage, Vondra, DO  ketorolac (TORADOL) 10 MG tablet Take 1 tablet (10 mg total) by mouth every 6 (six) hours as needed. 12/25/21  Yes Enrique Sack, FNP  metFORMIN (GLUCOPHAGE) 1000 MG tablet TAKE 1 TABLET BY MOUTH TWICE A DAY 12/16/20  Yes Milus Banister C, DO  olmesartan (BENICAR) 5 MG tablet Take 2 tablets (10 mg total) by mouth daily. 12/22/21  Yes Brimage, Vondra, DO  omeprazole (PRILOSEC) 40 MG capsule Take 1 capsule (40 mg total) by mouth daily. 08/28/19  Yes Enid Derry, [place], DO  predniSONE (STERAPRED UNI-PAK 21 TAB) 10 MG (21) TBPK tablet Take by mouth daily. Take 6 tabs by mouth daily  for 2 days, then 5 tabs for 2 days, then 4 tabs for 2 days, then 3 tabs for 2 days, 2 tabs for 2 days, then 1 tab by mouth daily for 2 days 12/25/21  Yes Enrique Sack, FNP  rosuvastatin (CRESTOR) 10 MG tablet Take 1 tablet (10 mg total) by mouth daily. 11/19/21  Yes Brimage, Ronnette Juniper, DO  aspirin EC 81 MG tablet Take 1 tablet (81 mg total) by mouth daily. Patient taking differently: Take 81 mg by mouth every evening.  08/15/14   Katheren Shams, DO  oxyCODONE-acetaminophen (PERCOCET/ROXICET) 5-325 MG tablet Take 1 tablet by mouth every 6  (six) hours as needed for severe pain. 12/25/21   Enrique Sack, FNP    Family History Family History  Problem Relation Age of Onset   Diabetes Mother    Diabetes Sister    Colon cancer Neg Hx    Colon polyps Neg Hx    Heart disease Neg Hx    Rectal cancer Neg Hx    Stomach cancer Neg Hx     Social History Social History   Tobacco Use   Smoking status: Every Day    Packs/day: 0.30    Years: 33.00    Pack years:  9.90    Types: Cigarettes   Smokeless tobacco: Never  Vaping Use   Vaping Use: Never used  Substance Use Topics   Alcohol use: Yes    Alcohol/week: 0.0 standard drinks    Comment: rarely   Drug use: No     Allergies   Hydrocodone   Review of Systems Review of Systems  Constitutional:  Negative for fever.  Eyes:  Negative for visual disturbance.  Respiratory:  Negative for shortness of breath.   Cardiovascular:  Negative for chest pain and palpitations.  Musculoskeletal:  Positive for arthralgias and joint swelling.  Neurological:  Negative for dizziness and headaches.  All other systems reviewed and are negative.   Physical Exam Triage Vital Signs ED Triage Vitals  Enc Vitals Group     BP 12/25/21 1455 (!) 178/98     Pulse Rate 12/25/21 1455 94     Resp --      Temp 12/25/21 1455 97.9 F (36.6 C)     Temp Source 12/25/21 1455 Oral     SpO2 12/25/21 1455 96 %     Weight 12/25/21 1457 246 lb 14.6 oz (112 kg)     Height 12/25/21 1457 5' 9.5" (1.765 m)     Head Circumference --      Peak Flow --      Pain Score 12/25/21 1457 10     Pain Loc --      Pain Edu? --      Excl. in [place]? --    No data found.  Updated Vital Signs BP (!) 178/98 (BP Location: Right Arm)    Pulse 94    Temp 97.9 F (36.6 C) (Oral)    Ht 5' 9.5" (1.765 m)    Wt 246 lb 14.6 oz (112 kg)    LMP 06/23/2013    SpO2 96%    BMI 35.94 kg/m   Visual Acuity Right Eye Distance:   Left Eye Distance:   Bilateral Distance:    Right Eye Near:   Left Eye Near:    Bilateral  Near:     Physical Exam Vitals reviewed.  Constitutional:      Appearance: Normal appearance. She is not ill-appearing.     Comments: Uncomfortable   HENT:     Head: Normocephalic.  Cardiovascular:     Rate and Rhythm: Normal rate.  Pulmonary:     Effort: Pulmonary effort is normal.  Musculoskeletal:     Right forearm: Normal.     Left forearm: Normal.     Right wrist: Normal.     Left wrist: Swelling and tenderness present. No deformity, effusion, lacerations, bony tenderness, snuff box tenderness or crepitus. Decreased range of motion. Normal pulse.     Right hand: Normal.     Left hand: Swelling and tenderness present. No deformity, lacerations or bony tenderness. Decreased range of motion. Normal strength. Normal sensation. There is no disruption of two-point discrimination. Normal capillary refill. Normal pulse.     Comments: Right hand dominant. Limited ROM due to the pain.   Skin:    General: Skin is warm and dry.  Neurological:     General: No focal deficit present.     Mental Status: She is alert and oriented to person, place, and time.  Psychiatric:        Mood and Affect: Mood normal.        Behavior: Behavior normal.     UC Treatments / Results  Labs (all labs ordered are  listed, but only abnormal results are displayed) Labs Reviewed  C-REACTIVE PROTEIN  SEDIMENTATION RATE    EKG   Radiology No results found.  Procedures Procedures (including critical care time)  Medications Ordered in UC Medications  ketorolac (TORADOL) injection 60 mg (has no administration in time range)    Initial Impression / Assessment and Plan / UC Course  I have reviewed the triage vital signs and the nursing notes.  Pertinent labs & imaging results that were available during my care of the patient were reviewed by me and considered in my medical decision making (see chart for details).     56 year old female with acute left hand/wrist pain and swelling.  She has a mark  to the posterior wrist region that could be possibly due to an insect bite.  No warmth or drainage noted.  Patient is very uncomfortable but nontoxic.  She has decreased range of motion due to the pain.  We will cast a wide net to cover for infectious or inflammatory etiologies.  Imaging not indicated at this time.  Toradol injection given in clinic.  Patient placed in wrist splint for comfort.  Advised RICE.  Anti-inflammatories, antibiotics also prescribed.  Advised patient to follow-up with her orthopedic specialist next week.  She should call tomorrow to arrange follow-up.  Additionally, patient noted to be hypertensive in the clinic.  Patient has a history of high blood pressure.  She reports compliance with her medication and states that she has taken her medications for today.  She denies any symptoms at this time.  Today's evaluation has revealed no signs of a dangerous process. Discussed diagnosis with patient and/or guardian. Patient and/or guardian aware of their diagnosis, possible red flag symptoms to watch out for and need for close follow up. Patient and/or guardian understands verbal and written discharge instructions. Patient and/or guardian comfortable with plan and disposition.  Patient and/or guardian has a clear mental status at this time, good insight into illness (after discussion and teaching) and has clear judgment to make decisions regarding their care  This care was provided during an unprecedented National Emergency due to the Novel Coronavirus (COVID-19) pandemic. COVID-19 infections and transmission risks place heavy strains on healthcare resources.  As this pandemic evolves, our facility, providers, and staff strive to respond fluidly, to remain operational, and to provide care relative to available resources and information. Outcomes are unpredictable and treatments are without well-defined guidelines. Further, the impact of COVID-19 on all aspects of urgent care, including the  impact to patients seeking care for reasons other than COVID-19, is unavoidable during this national emergency. At this time of the global pandemic, management of patients has significantly changed, even for non-COVID positive patients given high local and regional COVID volumes at this time requiring high healthcare system and resource utilization. The standard of care for management of both COVID suspected and non-COVID suspected patients continues to change rapidly at the local, regional, national, and global levels. This patient was worked up and treated to the best available but ever changing evidence and resources available at this current time.   Documentation was completed with the aid of voice recognition software. Transcription may contain typographical errors. Final Clinical Impressions(s) / UC Diagnoses   Final diagnoses:  Left wrist pain  Elevated blood pressure reading in office with diagnosis of hypertension     Discharge Instructions      There are many things that can cause wrist pain without injury. It could be due to arthritis, using  the joint too much or infection from an insect bite.   I am not sure why your wrist is hurting.   We are testing your blood to check for inflammation. If these are elevated, then your pain could be due to arthritis or overuse.   You also have a small mark on your wrist which could be from a bug bite. I have provided you with antibiotics to cover for possible infection   Take medications as prescribed. Use the splint for comfort. Do not get the splint wet. You can take it off for baths and sleeping. Keep your arm elevated and ice the area at least three times a day.   Follow-up with your orthopedic physician next week. Call tomorrow to make an appointment   Your blood pressure was elevated today. It could be due to you being in so much pain. Continue to take your blood pressure mediations as prescribed. Monitor your blood pressure closely. Go  to the ED immediately if you begin to experience any of the symptoms we discussed  Go to the ED immediately if:  You lose feeling in your fingers or hand. Your fingers turn white, very red, or cold and blue. You cannot move your fingers.     ED Prescriptions     Medication Sig Dispense Auth. Provider   ketorolac (TORADOL) 10 MG tablet Take 1 tablet (10 mg total) by mouth every 6 (six) hours as needed. 20 tablet Enrique Sack, FNP   oxyCODONE-acetaminophen (PERCOCET/ROXICET) 5-325 MG tablet  (Status: Discontinued) Take 1 tablet by mouth every 6 (six) hours as needed for severe pain. 6 tablet Enrique Sack, FNP   cephALEXin (KEFLEX) 500 MG capsule Take 1 capsule (500 mg total) by mouth 3 (three) times daily for 7 days. 20 capsule Enrique Sack, FNP   predniSONE (STERAPRED UNI-PAK 21 TAB) 10 MG (21) TBPK tablet Take by mouth daily. Take 6 tabs by mouth daily  for 2 days, then 5 tabs for 2 days, then 4 tabs for 2 days, then 3 tabs for 2 days, 2 tabs for 2 days, then 1 tab by mouth daily for 2 days 42 tablet Rashell Shambaugh, [place], FNP   oxyCODONE-acetaminophen (PERCOCET/ROXICET) 5-325 MG tablet Take 1 tablet by mouth every 6 (six) hours as needed for severe pain. 6 tablet Enrique Sack, FNP      I have reviewed the PDMP during this encounter.   Enrique Sack, [place] 12/25/21 [place], [place], [place] 12/25/21 [phone]

## 2021-12-26 LAB — C-REACTIVE PROTEIN: CRP: 25 mg/L — ABNORMAL HIGH (ref 0–10)

## 2021-12-26 LAB — SEDIMENTATION RATE: Sed Rate: 38 mm/hr (ref 0–40)

## 2022-01-16 ENCOUNTER — Other Ambulatory Visit: Payer: Self-pay | Admitting: Family Medicine

## 2022-01-16 DIAGNOSIS — I10 Essential (primary) hypertension: Secondary | ICD-10-CM

## 2022-01-21 ENCOUNTER — Ambulatory Visit: Payer: Medicaid Other | Admitting: Family Medicine

## 2022-01-21 NOTE — Progress Notes (Unsigned)
° °  SUBJECTIVE:   CHIEF COMPLAINT / HPI:   No chief complaint on file.    Melanie Phillips is a 56 y.o. female here for ***   Pt reports ***    PERTINENT  PMH / PSH: reviewed and updated as appropriate   OBJECTIVE:   LMP 06/23/2013   ***  ASSESSMENT/PLAN:   No problem-specific Assessment & Plan notes found for this encounter.     Lyndee Hensen, DO PGY-3, [facility] 01/21/2022      {    This will disappear when note is signed, click to select method of visit    :1}

## 2022-02-10 ENCOUNTER — Other Ambulatory Visit: Payer: Self-pay | Admitting: Family Medicine

## 2022-02-10 DIAGNOSIS — I10 Essential (primary) hypertension: Secondary | ICD-10-CM

## 2022-02-24 ENCOUNTER — Other Ambulatory Visit: Payer: Self-pay | Admitting: *Deleted

## 2022-02-24 MED ORDER — METFORMIN HCL 1000 MG PO TABS: 1000.0000 mg | ORAL_TABLET | Freq: Two times a day (BID) | ORAL | 3 refills | Status: DC

## 2022-03-18 ENCOUNTER — Other Ambulatory Visit: Payer: Self-pay | Admitting: Family Medicine

## 2022-03-18 DIAGNOSIS — I10 Essential (primary) hypertension: Secondary | ICD-10-CM

## 2022-04-15 ENCOUNTER — Ambulatory Visit: Payer: Medicaid Other | Admitting: Orthopaedic Surgery

## 2022-04-15 ENCOUNTER — Ambulatory Visit (INDEPENDENT_AMBULATORY_CARE_PROVIDER_SITE_OTHER): Payer: Medicaid Other

## 2022-04-15 DIAGNOSIS — M65341 Trigger finger, right ring finger: Secondary | ICD-10-CM

## 2022-04-15 DIAGNOSIS — M65331 Trigger finger, right middle finger: Secondary | ICD-10-CM | POA: Diagnosis not present

## 2022-04-15 NOTE — Progress Notes (Signed)
? ?Office Visit Note ?  ?Patient: Melanie Phillips           ?Date of Birth: 03/18/1966           ?MRN: [id number] ?Visit Date: 04/15/2022 ?             ?Requested by: Lyndee Hensen, DO ?[place] [address] ?[place],  [place] [address] ?PCP: Lyndee Hensen, DO ? ? ?Assessment & Plan: ?Visit Diagnoses:  ?1. Trigger ring finger of right hand   ?2. Trigger middle finger of right hand   ? ? ?Plan: Impression is right middle and ring trigger fingers.  Treatment options were reviewed to include brief immobilization and cortisone injection versus surgical release.  Based on her options she elects to move forward with surgical release since cortisone injections have not been effective for her trigger thumbs in the past.  Questions encouraged and answered.  Melanie Phillips will call the patient to schedule surgery. ? ?Follow-Up Instructions: No follow-ups on file.  ? ?Orders:  ?Orders Placed This Encounter  ?Procedures  ? XR Hand Complete Right  ? ?No orders of the defined types were placed in this encounter. ? ? ? ? Procedures: ?No procedures performed ? ? ?Clinical Data: ?No additional findings. ? ? ?Subjective: ?Chief Complaint  ?Patient presents with  ? Right Hand - Pain  ? ? ?HPI ?Melanie Phillips is a 56 year old female who is well-known to me comes in for a month right middle and ring trigger fingers.  Denies any injuries.  She has undergone bilateral trigger thumb releases by me several years ago.  She has done well from the surgeries.  She has pain in the palm and frequent triggering.  She is having a lot of difficulty with use of the hand for ADLs. ? ?Review of Systems  ?Constitutional: Negative.   ?HENT: Negative.    ?Eyes: Negative.   ?Respiratory: Negative.    ?Cardiovascular: Negative.   ?Endocrine: Negative.   ?Musculoskeletal: Negative.   ?Neurological: Negative.   ?Hematological: Negative.   ?Psychiatric/Behavioral: Negative.    ?All other systems reviewed and are negative. ? ? ?Objective: ?Vital Signs: LMP 06/23/2013   ? ?Physical Exam ?Vitals and nursing note reviewed.  ?Constitutional:   ?   Appearance: She is well-developed.  ?Pulmonary:  ?   Effort: Pulmonary effort is normal.  ?Skin: ?   General: Skin is warm.  ?   Capillary Refill: Capillary refill takes less than 2 seconds.  ?Neurological:  ?   Mental Status: She is alert and oriented to person, place, and time.  ?Psychiatric:     ?   Behavior: Behavior normal.     ?   Thought Content: Thought content normal.     ?   Judgment: Judgment normal.  ? ? ?Ortho Exam ? ?Examination of the right hand shows tenderness in the palm over the A1 pulley of the middle and ring finger.  She has triggering of both fingers.  No neurovascular compromise. ? ?Specialty Comments:  ?No specialty comments available. ? ?Imaging: ?No results found. ? ? ?PMFS History: ?Patient Active Problem List  ? Diagnosis Date Noted  ? Trigger finger, right middle finger 04/15/2022  ? Hoarse voice quality 12/24/2021  ? Pain in both knees 10/28/2021  ? Compression fracture of L1 lumbar vertebra ([place]) 11/14/2020  ? Screening for cholesterol level 11/12/2020  ? History of recent fall 11/12/2020  ? Breast cancer screening by mammogram 11/12/2020  ? COVID-19 vaccine administered 11/12/2020  ? No-show for appointment 06/26/2020  ?  Need for Tdap vaccination 06/12/2020  ? Arthritis of hand, right 06/12/2020  ? 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 Status post right knee replacement 09/12/2018  ? Unilateral primary osteoarthritis, right knee 08/11/2018  ? Tenosynovitis of finger 05/26/2017  ? Arthritis of carpometacarpal [facility]) joint of right thumb 03/10/2017  ? Essential hypertension 03/04/2017  ? Depression 03/09/2016  ? Carpal tunnel syndrome 03/09/2016  ? Diabetes mellitus, type 2 ([place]) 08/15/2014  ? Poor dental hygiene 08/15/2014  ? Sciatica 05/31/2012  ? OBESITY, UNSPECIFIED 03/07/2010  ? TOBACCO ABUSE 03/07/2010  ? Rectal pain 03/07/2010  ? PAIN IN JOINT, MULTIPLE SITES 03/07/2010  ? ?Past Medical  History:  ?Diagnosis Date  ? Anxiety   ? Arthritis   ? Back pain   ? Depression   ? Diabetes mellitus without complication ([place])   ? GERD (gastroesophageal reflux disease)   ? Hyperlipidemia   ? Hypertension   ?  ?Family History  ?Problem Relation Age of Onset  ? Diabetes Mother   ? Diabetes Sister   ? Colon cancer Neg Hx   ? Colon polyps Neg Hx   ? Heart disease Neg Hx   ? Rectal cancer Neg Hx   ? Stomach cancer Neg Hx   ?  ?Past Surgical History:  ?Procedure Laterality Date  ? CARPAL TUNNEL RELEASE Right 03/10/2017  ? Procedure: CARPAL TUNNEL RELEASE;  Surgeon: Leandrew Koyanagi, MD;  Location: [place];  Service: Orthopedics;  Laterality: Right;  ? CARPOMETACARPEL SUSPENSION PLASTY Right 03/10/2017  ? Procedure: Right thumb Ligament Reconstruction Tendon Interposition, Right Carpal tunnel release;  Surgeon: Leandrew Koyanagi, MD;  Location: [place];  Service: Orthopedics;  Laterality: Right;  ? JOINT REPLACEMENT    ? 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  ? Procedure: RIGHT TOTAL KNEE ARTHROPLASTY;  Surgeon: Leandrew Koyanagi, MD;  Location: [place];  Service: Orthopedics;  Laterality: Right;  ? TRIGGER FINGER RELEASE Left 05/26/2017  ? Procedure: RELEASE TRIGGER FINGER LEFT THUMB;  Surgeon: Leandrew Koyanagi, MD;  Location: [place];  Service: Orthopedics;  Laterality: Left;  ? TRIGGER FINGER RELEASE Right 02/23/2018  ? Procedure: RELEASE RIGHT TRIGGER THUMB;  Surgeon: Leandrew Koyanagi, MD;  Location: [place];  Service: Orthopedics;  Laterality: Right;  Bier block  ? TUBAL LIGATION    ? ?Social History  ? ?Occupational History  ? Not on file  ?Tobacco Use  ? Smoking status: Every Day  ?  Packs/day: 0.30  ?  Years: 33.00  ?  Pack years: 9.90  ?  Types: Cigarettes  ? Smokeless tobacco: Never  ?Vaping Use  ? Vaping Use: Never used  ?Substance and Sexual Activity  ? Alcohol use: Yes  ?  Alcohol/week: 0.0 standard drinks  ?  Comment: rarely  ? Drug use: No  ? Sexual  activity: Yes  ?  Birth control/protection: Surgical  ? ? ? ? ? ? ?

## 2022-04-17 NOTE — Telephone Encounter (Signed)
Sent to pcp for request

## 2022-04-29 ENCOUNTER — Other Ambulatory Visit: Payer: Self-pay

## 2022-04-29 ENCOUNTER — Encounter (HOSPITAL_BASED_OUTPATIENT_CLINIC_OR_DEPARTMENT_OTHER): Payer: Self-pay | Admitting: Orthopaedic Surgery

## 2022-05-06 ENCOUNTER — Encounter (HOSPITAL_BASED_OUTPATIENT_CLINIC_OR_DEPARTMENT_OTHER): Payer: Self-pay | Admitting: Orthopaedic Surgery

## 2022-05-06 ENCOUNTER — Ambulatory Visit (HOSPITAL_BASED_OUTPATIENT_CLINIC_OR_DEPARTMENT_OTHER)
Admission: RE | Admit: 2022-05-06 | Discharge: 2022-05-06 | Disposition: A | Discharge status: Home / Self Care | Payer: Medicaid Other | Attending: Orthopaedic Surgery | Admitting: Orthopaedic Surgery

## 2022-05-06 ENCOUNTER — Ambulatory Visit (HOSPITAL_BASED_OUTPATIENT_CLINIC_OR_DEPARTMENT_OTHER): Payer: Medicaid Other | Admitting: Anesthesiology

## 2022-05-06 ENCOUNTER — Encounter (HOSPITAL_BASED_OUTPATIENT_CLINIC_OR_DEPARTMENT_OTHER)
Admission: RE | Disposition: A | Discharge status: Home / Self Care | Payer: Self-pay | Source: Home / Self Care | Attending: Orthopaedic Surgery

## 2022-05-06 DIAGNOSIS — E119 Type 2 diabetes mellitus without complications: Secondary | ICD-10-CM

## 2022-05-06 DIAGNOSIS — Z794 Long term (current) use of insulin: Secondary | ICD-10-CM | POA: Diagnosis not present

## 2022-05-06 DIAGNOSIS — M65341 Trigger finger, right ring finger: Secondary | ICD-10-CM

## 2022-05-06 DIAGNOSIS — K219 Gastro-esophageal reflux disease without esophagitis: Secondary | ICD-10-CM | POA: Insufficient documentation

## 2022-05-06 DIAGNOSIS — F17200 Nicotine dependence, unspecified, uncomplicated: Secondary | ICD-10-CM | POA: Insufficient documentation

## 2022-05-06 DIAGNOSIS — Z79899 Other long term (current) drug therapy: Secondary | ICD-10-CM | POA: Insufficient documentation

## 2022-05-06 DIAGNOSIS — Z6836 Body mass index (BMI) 36.0-36.9, adult: Secondary | ICD-10-CM | POA: Insufficient documentation

## 2022-05-06 DIAGNOSIS — M65331 Trigger finger, right middle finger: Secondary | ICD-10-CM

## 2022-05-06 DIAGNOSIS — F32A Depression, unspecified: Secondary | ICD-10-CM | POA: Diagnosis not present

## 2022-05-06 DIAGNOSIS — I10 Essential (primary) hypertension: Secondary | ICD-10-CM | POA: Diagnosis not present

## 2022-05-06 DIAGNOSIS — E669 Obesity, unspecified: Secondary | ICD-10-CM | POA: Diagnosis not present

## 2022-05-06 DIAGNOSIS — Z7984 Long term (current) use of oral hypoglycemic drugs: Secondary | ICD-10-CM | POA: Insufficient documentation

## 2022-05-06 HISTORY — DX: Trigger finger, unspecified finger: M65.30

## 2022-05-06 HISTORY — PX: TRIGGER FINGER RELEASE: SHX641

## 2022-05-06 LAB — GLUCOSE, CAPILLARY
Glucose-Capillary: 139 mg/dL — ABNORMAL HIGH (ref 70–99)
Glucose-Capillary: 142 mg/dL — ABNORMAL HIGH (ref 70–99)

## 2022-05-06 SURGERY — RELEASE, A1 PULLEY, FOR TRIGGER FINGER
Anesthesia: Monitor Anesthesia Care | Site: Finger | Laterality: Right

## 2022-05-06 MED ORDER — FENTANYL CITRATE (PF) 100 MCG/2ML IJ SOLN: 25.0000 ug | INTRAMUSCULAR | Status: DC | PRN

## 2022-05-06 MED ORDER — BUPIVACAINE-EPINEPHRINE (PF) 0.25% -1:200000 IJ SOLN
INTRAMUSCULAR | Status: AC
  Filled 2022-05-06: qty 30

## 2022-05-06 MED ORDER — ONDANSETRON HCL 4 MG/2ML IJ SOLN
INTRAMUSCULAR | Status: AC
Start: 2022-05-06 — End: ?
  Filled 2022-05-06: qty 2

## 2022-05-06 MED ORDER — ONDANSETRON HCL 4 MG/2ML IJ SOLN: 4.0000 mg | Freq: Once | INTRAMUSCULAR | Status: DC | PRN

## 2022-05-06 MED ORDER — PHENYLEPHRINE 80 MCG/ML (10ML) SYRINGE FOR IV PUSH (FOR BLOOD PRESSURE SUPPORT)
PREFILLED_SYRINGE | INTRAVENOUS | Status: AC
Start: 2022-05-06 — End: ?
  Filled 2022-05-06: qty 10

## 2022-05-06 MED ORDER — EPHEDRINE 5 MG/ML INJ
INTRAVENOUS | Status: AC
  Filled 2022-05-06: qty 5

## 2022-05-06 MED ORDER — MIDAZOLAM HCL 5 MG/5ML IJ SOLN
INTRAMUSCULAR | Status: DC | PRN
  Administered 2022-05-06 (×2): 1 mg via INTRAVENOUS

## 2022-05-06 MED ORDER — MIDAZOLAM HCL 2 MG/2ML IJ SOLN
INTRAMUSCULAR | Status: AC
  Filled 2022-05-06: qty 2

## 2022-05-06 MED ORDER — CEFAZOLIN SODIUM-DEXTROSE 2-4 GM/100ML-% IV SOLN
2.0000 g | INTRAVENOUS | Status: AC
  Administered 2022-05-06: 2 g via INTRAVENOUS

## 2022-05-06 MED ORDER — LACTATED RINGERS IV SOLN: INTRAVENOUS | Status: DC

## 2022-05-06 MED ORDER — ACETAMINOPHEN 500 MG PO TABS
1000.0000 mg | ORAL_TABLET | Freq: Once | ORAL | Status: AC
  Administered 2022-05-06: 1000 mg via ORAL

## 2022-05-06 MED ORDER — PROPOFOL 500 MG/50ML IV EMUL
INTRAVENOUS | Status: DC | PRN
  Administered 2022-05-06: 125 ug/kg/min via INTRAVENOUS

## 2022-05-06 MED ORDER — ACETAMINOPHEN 500 MG PO TABS
ORAL_TABLET | ORAL | Status: AC
  Filled 2022-05-06: qty 2

## 2022-05-06 MED ORDER — LIDOCAINE 2% (20 MG/ML) 5 ML SYRINGE
INTRAMUSCULAR | Status: AC
Start: 2022-05-06 — End: ?
  Filled 2022-05-06: qty 5

## 2022-05-06 MED ORDER — LIDOCAINE HCL 1 % IJ SOLN
INTRAMUSCULAR | Status: DC | PRN
  Administered 2022-05-06: 10 mL via INTRAMUSCULAR

## 2022-05-06 MED ORDER — FENTANYL CITRATE (PF) 100 MCG/2ML IJ SOLN
INTRAMUSCULAR | Status: AC
  Filled 2022-05-06: qty 2

## 2022-05-06 MED ORDER — FENTANYL CITRATE (PF) 100 MCG/2ML IJ SOLN
INTRAMUSCULAR | Status: DC | PRN
  Administered 2022-05-06 (×2): 50 ug via INTRAVENOUS

## 2022-05-06 MED ORDER — OXYCODONE-ACETAMINOPHEN 5-325 MG PO TABS: 1.0000 | ORAL_TABLET | Freq: Two times a day (BID) | ORAL | 0 refills | Status: DC | PRN

## 2022-05-06 MED ORDER — SUCCINYLCHOLINE CHLORIDE 200 MG/10ML IV SOSY
PREFILLED_SYRINGE | INTRAVENOUS | Status: AC
  Filled 2022-05-06: qty 10

## 2022-05-06 MED ORDER — ONDANSETRON HCL 4 MG/2ML IJ SOLN
INTRAMUSCULAR | Status: DC | PRN
  Administered 2022-05-06: 4 mg via INTRAVENOUS

## 2022-05-06 MED ORDER — CEFAZOLIN SODIUM-DEXTROSE 2-4 GM/100ML-% IV SOLN
INTRAVENOUS | Status: AC
  Filled 2022-05-06: qty 100

## 2022-05-06 MED ORDER — LIDOCAINE HCL (PF) 1 % IJ SOLN
INTRAMUSCULAR | Status: AC
  Filled 2022-05-06: qty 30

## 2022-05-06 SURGICAL SUPPLY — 44 items
BAND INSRT 18 STRL LF DISP RB (MISCELLANEOUS)
BAND RUBBER #18 3X1/16 STRL (MISCELLANEOUS) ×4 IMPLANT
BLADE SURG 15 STRL LF DISP TIS (BLADE) ×2 IMPLANT
BLADE SURG 15 STRL SS (BLADE) ×2
BNDG CMPR 9X4 STRL LF SNTH (GAUZE/BANDAGES/DRESSINGS) ×1
BNDG ELASTIC 3X5.8 VLCR STR LF (GAUZE/BANDAGES/DRESSINGS) ×3 IMPLANT
BNDG ESMARK 4X9 LF (GAUZE/BANDAGES/DRESSINGS) ×1 IMPLANT
BRUSH SCRUB EZ PLAIN DRY (MISCELLANEOUS) ×2 IMPLANT
CANISTER SUCT 1200ML W/VALVE (MISCELLANEOUS) ×2 IMPLANT
CORD BIPOLAR FORCEPS 12FT (ELECTRODE) ×3 IMPLANT
COVER BACK TABLE 60X90IN (DRAPES) ×3 IMPLANT
COVER MAYO STAND STRL (DRAPES) ×3 IMPLANT
CUFF TOURN SGL QUICK 18X4 (TOURNIQUET CUFF) ×2 IMPLANT
DRAPE EXTREMITY T 121X128X90 (DISPOSABLE) ×3 IMPLANT
DRAPE SURG 17X23 STRL (DRAPES) ×3 IMPLANT
GAUZE SPONGE 4X4 12PLY STRL (GAUZE/BANDAGES/DRESSINGS) ×3 IMPLANT
GAUZE XEROFORM 1X8 LF (GAUZE/BANDAGES/DRESSINGS) ×3 IMPLANT
GLOVE ECLIPSE 7.0 STRL STRAW (GLOVE) ×3 IMPLANT
GLOVE INDICATOR 7.0 STRL GRN (GLOVE) ×3 IMPLANT
GLOVE INDICATOR 7.5 STRL GRN (GLOVE) ×4 IMPLANT
GLOVE SURG SYN 7.5  E (GLOVE) ×4
GLOVE SURG SYN 7.5 E (GLOVE) ×2 IMPLANT
GLOVE SURG SYN 7.5 PF PI (GLOVE) ×2 IMPLANT
GOWN STRL REIN XL XLG (GOWN DISPOSABLE) ×3 IMPLANT
GOWN STRL REUS W/ TWL XL LVL3 (GOWN DISPOSABLE) ×2 IMPLANT
GOWN STRL REUS W/TWL XL LVL3 (GOWN DISPOSABLE) ×4
NDL HYPO 25X1 1.5 SAFETY (NEEDLE) ×2 IMPLANT
NEEDLE HYPO 25X1 1.5 SAFETY (NEEDLE) ×2 IMPLANT
NS IRRIG 1000ML POUR BTL (IV SOLUTION) ×3 IMPLANT
PACK BASIN DAY SURGERY FS (CUSTOM PROCEDURE TRAY) ×3 IMPLANT
PAD CAST 3X4 CTTN HI CHSV (CAST SUPPLIES) ×2 IMPLANT
PADDING CAST COTTON 3X4 STRL (CAST SUPPLIES) ×2
SHEET MEDIUM DRAPE 40X70 STRL (DRAPES) ×2 IMPLANT
SPIKE FLUID TRANSFER (MISCELLANEOUS) IMPLANT
STOCKINETTE 4X48 STRL (DRAPES) ×3 IMPLANT
SUCTION FRAZIER HANDLE 12FR (TUBING) ×2
SUCTION TUBE FRAZIER 12FR DISP (TUBING) IMPLANT
SUT ETHILON 4 0 PS 2 18 (SUTURE) ×4 IMPLANT
SYR BULB EAR ULCER 3OZ GRN STR (SYRINGE) ×3 IMPLANT
SYR CONTROL 10ML LL (SYRINGE) ×3 IMPLANT
TOWEL GREEN STERILE FF (TOWEL DISPOSABLE) ×3 IMPLANT
TRAY DSU PREP LF (CUSTOM PROCEDURE TRAY) ×3 IMPLANT
TUBE CONNECTING 20X1/4 (TUBING) ×2 IMPLANT
UNDERPAD 30X36 HEAVY ABSORB (UNDERPADS AND DIAPERS) ×3 IMPLANT

## 2022-05-06 NOTE — H&P (Signed)
PREOPERATIVE H&P  Chief Complaint: right middle and ring trigger finger  HPI: Melanie Phillips is a 56 y.o. female who presents for surgical treatment of right middle and ring trigger finger.  She denies any changes in medical history.  Past Medical History:  Diagnosis Date   Arthritis    Back pain    Diabetes mellitus without complication (HCC)    GERD (gastroesophageal reflux disease)    Hyperlipidemia    Hypertension    Trigger finger of right hand    RMF, RRF   Past Surgical History:  Procedure Laterality Date   CARPAL TUNNEL RELEASE Right 03/10/2017   Procedure: CARPAL TUNNEL RELEASE;  Surgeon: Leandrew Koyanagi, MD;  Location: [place];  Service: Orthopedics;  Laterality: Right;   CARPOMETACARPEL SUSPENSION PLASTY Right 03/10/2017   Procedure: Right thumb Ligament Reconstruction Tendon Interposition, Right Carpal tunnel release;  Surgeon: Leandrew Koyanagi, MD;  Location: [place];  Service: Orthopedics;  Laterality: Right;   JOINT REPLACEMENT     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   Procedure: RIGHT TOTAL KNEE ARTHROPLASTY;  Surgeon: Leandrew Koyanagi, MD;  Location: [place];  Service: Orthopedics;  Laterality: Right;   TRIGGER FINGER RELEASE Left 05/26/2017   Procedure: RELEASE TRIGGER FINGER LEFT THUMB;  Surgeon: Leandrew Koyanagi, MD;  Location: [place];  Service: Orthopedics;  Laterality: Left;   TRIGGER FINGER RELEASE Right 02/23/2018   Procedure: RELEASE RIGHT TRIGGER THUMB;  Surgeon: Leandrew Koyanagi, MD;  Location: [place];  Service: Orthopedics;  Laterality: Right;  Bier block   TUBAL LIGATION     Social History   Socioeconomic History   Marital status: Single    Spouse name: Not on file   Number of children: Not on file   Years of education: Not on file   Highest education level: Not on file  Occupational History   Not on file  Tobacco Use   Smoking status: Every Day    Packs/day: 0.50    Years:  33.00    Pack years: 16.50    Types: Cigarettes   Smokeless tobacco: Never  Vaping Use   Vaping Use: Never used  Substance and Sexual Activity   Alcohol use: Not Currently    Comment: rarely   Drug use: No   Sexual activity: Yes    Birth control/protection: Surgical    Comment: BTL  Other Topics Concern   Not on file  Social History Narrative   Not on file   Social Determinants of Health   Financial Resource Strain: Not on file  Food Insecurity: Not on file  Transportation Needs: Not on file  Physical Activity: Not on file  Stress: Not on file  Social Connections: Not on file   Family History  Problem Relation Age of Onset   Diabetes Mother    Diabetes Sister    Colon cancer Neg Hx    Colon polyps Neg Hx    Heart disease Neg Hx    Rectal cancer Neg Hx    Stomach cancer Neg Hx    Allergies  Allergen Reactions   Hydrocodone Hives   Prior to Admission medications   Medication Sig Start Date End Date Taking? Authorizing Provider  aspirin EC 81 MG tablet Take 1 tablet (81 mg total) by mouth daily. Patient taking differently: Take 81 mg by mouth every evening. 08/15/14  Yes Luiz Blare Y, DO  insulin glargine (LANTUS) 100 UNIT/ML Solostar Pen  Inject 10 Units into the skin at bedtime. 12/13/21  Yes Brimage, Vondra, DO  metFORMIN (GLUCOPHAGE) 1000 MG tablet Take 1 tablet (1,000 mg total) by mouth 2 (two) times daily. 02/24/22  Yes Welborn, Ryan, DO  olmesartan (BENICAR) 5 MG tablet TAKE 2 TABLETS BY MOUTH EVERY DAY 03/18/22  Yes Brimage, Vondra, DO  omeprazole (PRILOSEC) 40 MG capsule Take 1 capsule (40 mg total) by mouth daily. 08/28/19  Yes Enid Derry, [place], DO  rosuvastatin (CRESTOR) 10 MG tablet Take 1 tablet (10 mg total) by mouth daily. 11/19/21  Yes Brimage, Ronnette Juniper, DO  Accu-Chek FastClix Lancets MISC Use as needed to check blood sugar. 05/28/20   Nuala Alpha, MD  Blood Glucose Monitoring Suppl (ACCU-CHEK GUIDE) w/Device KIT 1 Device by Does not apply route in the  morning and at bedtime. 05/28/20   Nuala Alpha, MD  glucose blood (ACCU-CHEK SMARTVIEW) test strip Use as instructed 12/13/21   Lyndee Hensen, DO  Insulin Pen Needle (BD PEN NEEDLE NANO 2ND GEN) 32G X 4 MM MISC 1 Units by Does not apply route 4 (four) times daily -  before meals and at bedtime. 12/13/21   Brimage, Ronnette Juniper, DO     Positive ROS: All other systems have been reviewed and were otherwise negative with the exception of those mentioned in the HPI and as above.  Physical Exam: General: Alert, no acute distress Cardiovascular: No pedal edema Respiratory: No cyanosis, no use of accessory musculature GI: abdomen soft Skin: No lesions in the area of chief complaint Neurologic: Sensation intact distally Psychiatric: Patient is competent for consent with normal mood and affect Lymphatic: no lymphedema  MUSCULOSKELETAL: exam stable  Assessment: right middle and ring trigger finger  Plan: Plan for Procedure(s): RIGHT MIDDLE & RING TRIGGER FINGER RELEASE  The risks benefits and alternatives were discussed with the patient including but not limited to the risks of nonoperative treatment, versus surgical intervention including infection, bleeding, nerve injury,  blood clots, cardiopulmonary complications, morbidity, mortality, among others, and they were willing to proceed.   Preoperative templating of the joint replacement has been completed, documented, and submitted to the Operating Room personnel in order to optimize intra-operative equipment management.   Eduard Roux, MD 05/06/2022 2:44 PM

## 2022-05-06 NOTE — Anesthesia Preprocedure Evaluation (Addendum)
Anesthesia Evaluation  Patient identified by MRN, date of birth, ID band Patient awake    Reviewed: Allergy & Precautions, NPO status , Patient's Chart, lab work & pertinent test results  Airway Mallampati: II  TM Distance: >3 FB Neck ROM: Full    Dental  (+) Dental Advisory Given, Edentulous Lower, Edentulous Upper   Pulmonary Current Smoker and Patient abstained from smoking.,    Pulmonary exam normal breath sounds clear to auscultation       Cardiovascular hypertension, Pt. on medications Normal cardiovascular exam Rhythm:Regular Rate:Normal     Neuro/Psych PSYCHIATRIC DISORDERS Depression  Neuromuscular disease    GI/Hepatic Neg liver ROS, GERD  Medicated,  Endo/Other  diabetes, Type 2, Insulin Dependent, Oral Hypoglycemic AgentsObesity   Renal/GU negative Renal ROS     Musculoskeletal  (+) Arthritis , Trigger finger of right hand   Abdominal   Peds  Hematology negative hematology ROS (+)   Anesthesia Other Findings Day of surgery medications reviewed with the patient.  Reproductive/Obstetrics                            Anesthesia Physical Anesthesia Plan  ASA: 3  Anesthesia Plan: MAC   Post-op Pain Management: Tylenol PO (pre-op)*   Induction: Intravenous  PONV Risk Score and Plan: 1 and TIVA, Treatment may vary due to age or medical condition and Midazolam  Airway Management Planned: Natural Airway and Nasal Cannula  Additional Equipment:   Intra-op Plan:   Post-operative Plan:   Informed Consent: I have reviewed the patients History and Physical, chart, labs and discussed the procedure including the risks, benefits and alternatives for the proposed anesthesia with the patient or authorized representative who has indicated his/her understanding and acceptance.     Dental advisory given  Plan Discussed with: CRNA  Anesthesia Plan Comments:         Anesthesia  Quick Evaluation

## 2022-05-06 NOTE — Transfer of Care (Signed)
Immediate Anesthesia Transfer of Care Note  Patient: Melanie Phillips  Procedure(s) Performed: RIGHT MIDDLE & RING TRIGGER FINGER RELEASE (Right: Finger)  Patient Location: PACU  Anesthesia Type:MAC  Level of Consciousness: awake, alert  and oriented  Airway & Oxygen Therapy: Patient Spontanous Breathing and Patient connected to face mask oxygen  Post-op Assessment: Report given to RN and Post -op Vital signs reviewed and stable  Post vital signs: Reviewed and stable  Last Vitals:  Vitals Value Taken Time  BP    Temp    Pulse 90 05/06/22 1555  Resp 17 05/06/22 1555  SpO2 100 % 05/06/22 1555  Vitals shown include unvalidated device data.  Last Pain:  Vitals:   05/06/22 1352  TempSrc: Oral  PainSc: 10-Worst pain ever      Patients Stated Pain Goal: 10 (73/42/87 [id number])  Complications: No notable events documented.

## 2022-05-06 NOTE — Op Note (Signed)
   Date of Surgery: 05/06/2022  INDICATIONS: Ms. Foutz is a 56 y.o.-year-old female who presents for surgical treatment of a right middle and ring trigger finger;  The patient did consent to the procedure after discussion of the risks and benefits.  PREOPERATIVE DIAGNOSIS: right middle and ring trigger finger  POSTOPERATIVE DIAGNOSIS: Same.  PROCEDURE: Incision of A1 pulley for trigger finger release, right middle and ring finger through 2 separate incisions  SURGEON: N. Eduard Roux, M.D.  ASSIST: Ciro Backer [place], [place]; necessary for the timely completion of procedure and due to complexity of procedure..  ANESTHESIA:  Bier block  IV FLUIDS AND URINE: See anesthesia.  ESTIMATED BLOOD LOSS: minimal mL  COMPLICATIONS: None.  DESCRIPTION OF PROCEDURE: The patient was identified in the preoperative holding area. The operative site was marked by the surgeon confirmed with the patient. He is brought back to the operating room. She was placed supine on table. A nonsterile tourniquet was placed on the upper forearm. Local anesthetic was placed in the planned operative site. The operative extremity was prepped and draped in standard sterile fashion. Timeout was performed. Antibiotics were given. Timeout was performed.  Tourniquet was inflated to 250 mmHg.  A vertical incision based over the metacarpal in line with the ring finger was created. Blunt dissection was taken down to the level of the flexor tendon. The neurovascular bundles were identified on each side of the tendon sheath and protected. The proximal edge of the A1 pulley was identified. This was sharply incised. Of note the tendon was of good quality and did not exhibit any tears. The A1 pulley was incised along its full width. Care was taken not to violate the A2 pulley. The palmar pulley was then visualized and released also.   The same steps were repeated for the middle finger.  The tourniquet was then deflated and hemostasis was  obtained. . The wound was thoroughly irrigated and closed with 3-0 nylon sutures. Sterile dressings were applied and the hand was placed in a soft dressing. Patient tolerated the procedure well and was taken to the PACU in stable condition.  POSTOPERATIVE PLAN: Patient will be weight bearing as tolerated and to avoid heavy lifting for 4 weeks.    Azucena Cecil, MD 3:42 PM

## 2022-05-06 NOTE — Anesthesia Postprocedure Evaluation (Signed)
Anesthesia Post Note  Patient: Melanie Phillips  Procedure(s) Performed: RIGHT MIDDLE & RING TRIGGER FINGER RELEASE (Right: Finger)     Patient location during evaluation: PACU Anesthesia Type: MAC Level of consciousness: awake and alert Pain management: pain level controlled Vital Signs Assessment: post-procedure vital signs reviewed and stable Respiratory status: spontaneous breathing, nonlabored ventilation, respiratory function stable and patient connected to nasal cannula oxygen Cardiovascular status: stable and blood pressure returned to baseline Postop Assessment: no apparent nausea or vomiting Anesthetic complications: no   No notable events documented.  Last Vitals:  Vitals:   05/06/22 1615 05/06/22 1645  BP: (!) 136/96 (!) 165/95  Pulse: 66 72  Resp: 14 16  Temp:  36.7 C  SpO2: 99% 94%    Last Pain:  Vitals:   05/06/22 1645  TempSrc:   PainSc: 0-No pain                 Santa Lighter

## 2022-05-06 NOTE — Discharge Instructions (Addendum)
PT HAD 1000 MG TYLENOL AT 2:00pm. No tylenol products for 6 hours     Post Anesthesia Home Care Instructions  Activity: Get plenty of rest for the remainder of the day. A responsible individual must stay with you for 24 hours following the procedure.  For the next 24 hours, DO NOT: -Drive a car -Paediatric nurse -Drink alcoholic beverages -Take any medication unless instructed by your physician -Make any legal decisions or sign important papers.  Meals: Start with liquid foods such as gelatin or soup. Progress to regular foods as tolerated. Avoid greasy, spicy, heavy foods. If nausea and/or vomiting occur, drink only clear liquids until the nausea and/or vomiting subsides. Call your physician if vomiting continues.  Special Instructions/Symptoms: Your throat may feel dry or sore from the anesthesia or the breathing tube placed in your throat during surgery. If this causes discomfort, gargle with warm salt water. The discomfort should disappear within 24 hours.  If you had a scopolamine patch placed behind your ear for the management of post- operative nausea and/or vomiting:  1. The medication in the patch is effective for 72 hours, after which it should be removed.  Wrap patch in a tissue and discard in the trash. Wash hands thoroughly with soap and water. 2. You may remove the patch earlier than 72 hours if you experience unpleasant side effects which may include dry mouth, dizziness or visual disturbances. 3. Avoid touching the patch. Wash your hands with soap and water after contact with the patch.     Post Anesthesia Home Care Instructions  Activity: Get plenty of rest for the remainder of the day. A responsible individual must stay with you for 24 hours following the procedure.  For the next 24 hours, DO NOT: -Drive a car -Paediatric nurse -Drink alcoholic beverages -Take any medication unless instructed by your physician -Make any legal decisions or sign important  papers.  Meals: Start with liquid foods such as gelatin or soup. Progress to regular foods as tolerated. Avoid greasy, spicy, heavy foods. If nausea and/or vomiting occur, drink only clear liquids until the nausea and/or vomiting subsides. Call your physician if vomiting continues.  Special Instructions/Symptoms: Your throat may feel dry or sore from the anesthesia or the breathing tube placed in your throat during surgery. If this causes discomfort, gargle with warm salt water. The discomfort should disappear within 24 hours.  If you had a scopolamine patch placed behind your ear for the management of post- operative nausea and/or vomiting:  1. The medication in the patch is effective for 72 hours, after which it should be removed.  Wrap patch in a tissue and discard in the trash. Wash hands thoroughly with soap and water. 2. You may remove the patch earlier than 72 hours if you experience unpleasant side effects which may include dry mouth, dizziness or visual disturbances. 3. Avoid touching the patch. Wash your hands with soap and water after contact with the patch.    Call your surgeon if you experience:   1.  Fever over 101.0. 2.  Inability to urinate. 3.  Nausea and/or vomiting. 4.  Extreme swelling or bruising at the surgical site. 5.  Continued bleeding from the incision. 6.  Increased pain, redness or drainage from the incision. 7.  Problems related to your pain medication. 8.  Any problems and/or concerns        Postoperative instructions:  Weightbearing instructions: no heavy lifting for 4 weeks  Dressing instructions: Keep your dressing and/or splint clean  and dry at all times.  It will be removed at your first post-operative appointment.  Your stitches and/or staples will be removed at this visit.  Incision instructions:  Do not soak your incision for 3 weeks after surgery.  If the incision gets wet, pat dry and do not scrub the incision.  Pain control:  You have  been given a prescription to be taken as directed for post-operative pain control.  In addition, elevate the operative extremity above the heart at all times to prevent swelling and throbbing pain.  Take over-the-counter Colace, '100mg'$  by mouth twice a day while taking narcotic pain medications to help prevent constipation.  Follow up appointments: 1) 7 days for wound check. 2) Dr. Erlinda Hong as scheduled.   -------------------------------------------------------------------------------------------------------------  After Surgery Pain Control:  After your surgery, post-surgical discomfort or pain is likely. This discomfort can last several days to a few weeks. At certain times of the day your discomfort may be more intense.  Did you receive a nerve block?  A nerve block can provide pain relief for one hour to two days after your surgery. As long as the nerve block is working, you will experience little or no sensation in the area the surgeon operated on.  As the nerve block wears off, you will begin to experience pain or discomfort. It is very important that you begin taking your prescribed pain medication before the nerve block fully wears off. Treating your pain at the first sign of the block wearing off will ensure your pain is better controlled and more tolerable when full-sensation returns. Do not wait until the pain is intolerable, as the medicine will be less effective. It is better to treat pain in advance than to try and catch up.  General Anesthesia:  If you did not receive a nerve block during your surgery, you will need to start taking your pain medication shortly after your surgery and should continue to do so as prescribed by your surgeon.  Pain Medication:  Most commonly we prescribe Vicodin and Percocet for post-operative pain. Both of these medications contain a combination of acetaminophen (Tylenol) and a narcotic to help control pain.   It takes between 30 and 45 minutes before pain  medication starts to work. It is important to take your medication before your pain level gets too intense.   Nausea is a common side effect of many pain medications. You will want to eat something before taking your pain medicine to help prevent nausea.   If you are taking a prescription pain medication that contains acetaminophen, we recommend that you do not take additional over the counter acetaminophen (Tylenol).  Other pain relieving options:   Using a cold pack to ice the affected area a few times a day (15 to 20 minutes at a time) can help to relieve pain, reduce swelling and bruising.   Elevation of the affected area can also help to reduce pain and swelling.

## 2022-05-07 ENCOUNTER — Encounter (HOSPITAL_BASED_OUTPATIENT_CLINIC_OR_DEPARTMENT_OTHER): Payer: Self-pay | Admitting: Orthopaedic Surgery

## 2022-05-13 ENCOUNTER — Ambulatory Visit (INDEPENDENT_AMBULATORY_CARE_PROVIDER_SITE_OTHER): Payer: Medicaid Other | Admitting: Physician Assistant

## 2022-05-13 ENCOUNTER — Other Ambulatory Visit: Payer: Self-pay | Admitting: Family Medicine

## 2022-05-13 DIAGNOSIS — M65331 Trigger finger, right middle finger: Secondary | ICD-10-CM

## 2022-05-13 DIAGNOSIS — M65341 Trigger finger, right ring finger: Secondary | ICD-10-CM

## 2022-05-13 DIAGNOSIS — I10 Essential (primary) hypertension: Secondary | ICD-10-CM

## 2022-05-13 MED ORDER — OXYCODONE-ACETAMINOPHEN 5-325 MG PO TABS: 1.0000 | ORAL_TABLET | Freq: Three times a day (TID) | ORAL | 0 refills | Status: DC | PRN

## 2022-05-13 NOTE — Progress Notes (Signed)
Post-Op Visit Note   Patient: Melanie Phillips           Date of Birth: May 22, 1966           MRN: [id number] Visit Date: 05/13/2022 PCP: Lyndee Hensen, DO   Assessment & Plan:  Chief Complaint:  Chief Complaint  Patient presents with   Right Hand - Pain   Visit Diagnoses:  1. Trigger finger, right ring finger   2. Trigger finger, right middle finger     Plan: Patient is a pleasant 56 year old female who comes in today 1 week status post right middle and right ring trigger finger release, date of surgery 05/06/2022.  She has been doing okay but has been in the moderate amount of pain.  She is taking oxycodone twice daily without significant relief.  No fevers or chills.  Examination of the right hand reveals well healing surgical incisions with nylon sutures in place.  No evidence of infection or cellulitis.  She is nearly able to make a full composite fist and has near full extension of all 5 fingers.  Fingers warm well perfused and she is neurovascular intact distally.  At this point, her wounds were cleaned and recovered with Band-Aids.  No heavy lifting or submerging her hand in water, but may continue with range of motion exercises.  She will follow-up next week for suture removal.  I have agreed to refill her Percocet once more.  Call with concerns or questions in the meantime.  Follow-Up Instructions: Return in about 1 week (around 05/20/2022).   Orders:  No orders of the defined types were placed in this encounter.  No orders of the defined types were placed in this encounter.   Imaging: No new imaging  PMFS History: Patient Active Problem List   Diagnosis Date Noted   Trigger finger, right ring finger 05/06/2022   Trigger finger, right middle finger 04/15/2022   Hoarse voice quality 12/24/2021   Pain in both knees 10/28/2021   Compression fracture of L1 lumbar vertebra ([place]) 11/14/2020   Screening for cholesterol level 11/12/2020   History of recent fall 11/12/2020    Breast cancer screening by mammogram 11/12/2020   COVID-19 vaccine administered 11/12/2020   No-show for appointment 06/26/2020   Need for Tdap vaccination 06/12/2020   Arthritis of hand, right 06/12/2020   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   Status post right knee replacement 09/12/2018   Unilateral primary osteoarthritis, right knee 08/11/2018   Tenosynovitis of finger 05/26/2017   Arthritis of carpometacarpal [facility]) joint of right thumb 03/10/2017   Essential hypertension 03/04/2017   Depression 03/09/2016   Carpal tunnel syndrome 03/09/2016   Diabetes mellitus, type 2 ([place]) 08/15/2014   Poor dental hygiene 08/15/2014   Sciatica 05/31/2012   OBESITY, UNSPECIFIED 03/07/2010   TOBACCO ABUSE 03/07/2010   Rectal pain 03/07/2010   PAIN IN JOINT, MULTIPLE SITES 03/07/2010   Past Medical History:  Diagnosis Date   Arthritis    Back pain    Diabetes mellitus without complication (HCC)    GERD (gastroesophageal reflux disease)    Hyperlipidemia    Hypertension    Trigger finger of right hand    RMF, RRF    Family History  Problem Relation Age of Onset   Diabetes Mother    Diabetes Sister    Colon cancer Neg Hx    Colon polyps Neg Hx    Heart disease Neg Hx    Rectal cancer Neg Hx    Stomach  cancer Neg Hx     Past Surgical History:  Procedure Laterality Date   CARPAL TUNNEL RELEASE Right 03/10/2017   Procedure: CARPAL TUNNEL RELEASE;  Surgeon: Leandrew Koyanagi, MD;  Location: [place];  Service: Orthopedics;  Laterality: Right;   CARPOMETACARPEL SUSPENSION PLASTY Right 03/10/2017   Procedure: Right thumb Ligament Reconstruction Tendon Interposition, Right Carpal tunnel release;  Surgeon: Leandrew Koyanagi, MD;  Location: [place];  Service: Orthopedics;  Laterality: Right;   JOINT REPLACEMENT     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   Procedure: RIGHT TOTAL KNEE ARTHROPLASTY;  Surgeon: Leandrew Koyanagi, MD;  Location: [place];  Service:  Orthopedics;  Laterality: Right;   TRIGGER FINGER RELEASE Left 05/26/2017   Procedure: RELEASE TRIGGER FINGER LEFT THUMB;  Surgeon: Leandrew Koyanagi, MD;  Location: [place];  Service: Orthopedics;  Laterality: Left;   TRIGGER FINGER RELEASE Right 02/23/2018   Procedure: RELEASE RIGHT TRIGGER THUMB;  Surgeon: Leandrew Koyanagi, MD;  Location: [place];  Service: Orthopedics;  Laterality: Right;  Bier block   TRIGGER FINGER RELEASE Right 05/06/2022   Procedure: RIGHT MIDDLE & RING TRIGGER FINGER RELEASE;  Surgeon: Leandrew Koyanagi, MD;  Location: [place];  Service: Orthopedics;  Laterality: Right;   TUBAL LIGATION     Social History   Occupational History   Not on file  Tobacco Use   Smoking status: Every Day    Packs/day: 0.50    Years: 33.00    Total pack years: 16.50    Types: Cigarettes   Smokeless tobacco: Never  Vaping Use   Vaping Use: Never used  Substance and Sexual Activity   Alcohol use: Not Currently    Comment: rarely   Drug use: No   Sexual activity: Yes    Birth control/protection: Surgical    Comment: BTL

## 2022-05-20 ENCOUNTER — Encounter: Payer: Medicaid Other | Admitting: Orthopaedic Surgery

## 2022-06-21 ENCOUNTER — Other Ambulatory Visit: Payer: Self-pay | Admitting: Family Medicine

## 2022-06-21 DIAGNOSIS — I10 Essential (primary) hypertension: Secondary | ICD-10-CM

## 2022-07-10 ENCOUNTER — Ambulatory Visit: Payer: Medicaid Other | Admitting: Student

## 2022-07-10 ENCOUNTER — Other Ambulatory Visit: Payer: Self-pay | Admitting: Student

## 2022-07-10 VITALS — BP 130/82 | HR 99 | Ht 69.0 in | Wt 247.6 lb

## 2022-07-10 DIAGNOSIS — F17200 Nicotine dependence, unspecified, uncomplicated: Secondary | ICD-10-CM

## 2022-07-10 DIAGNOSIS — Z794 Long term (current) use of insulin: Secondary | ICD-10-CM | POA: Diagnosis not present

## 2022-07-10 DIAGNOSIS — M25472 Effusion, left ankle: Secondary | ICD-10-CM | POA: Diagnosis not present

## 2022-07-10 DIAGNOSIS — I10 Essential (primary) hypertension: Secondary | ICD-10-CM | POA: Diagnosis not present

## 2022-07-10 DIAGNOSIS — E119 Type 2 diabetes mellitus without complications: Secondary | ICD-10-CM | POA: Diagnosis present

## 2022-07-10 DIAGNOSIS — Z1231 Encounter for screening mammogram for malignant neoplasm of breast: Secondary | ICD-10-CM | POA: Diagnosis not present

## 2022-07-10 LAB — POCT GLYCOSYLATED HEMOGLOBIN (HGB A1C): HbA1c, POC (controlled diabetic range): 7.1 % — AB (ref 0.0–7.0)

## 2022-07-10 MED ORDER — ACCU-CHEK GUIDE VI STRP: ORAL_STRIP | 12 refills | Status: DC

## 2022-07-10 MED ORDER — SEMAGLUTIDE-WEIGHT MANAGEMENT 0.25 MG/0.5ML ~~LOC~~ SOAJ: 0.2500 mg | SUBCUTANEOUS | 1 refills | Status: DC

## 2022-07-10 NOTE — Assessment & Plan Note (Signed)
130/82 on recheck. -Continue olmesartan 10 mg daily

## 2022-07-10 NOTE — Progress Notes (Signed)
    SUBJECTIVE:   CHIEF COMPLAINT / HPI:   Diabetic Follow Up: Patient is a 56 y.o. female who present today for diabetic follow up.   Patient endorses no problems  Home medications include: lantus 10 U nightly (not every night if FBG 150 or greater in morning), metformin 1000 mg BID, olmesartan 10 mg daily, rosuvastatin 10 mg daily  Most recent A1Cs:  Lab Results  Component Value Date   HGBA1C 7.1 (A) 07/10/2022   HGBA1C 7.3 (A) 11/19/2021   HGBA1C 7.7 (A) 11/12/2020   Last Microalbumin, LDL, Creatinine: Lab Results  Component Value Date   MICROALBUR 0.62 08/15/2014   LDLCALC 62 11/12/2020   CREATININE 0.68 11/19/2021   Patient does check blood glucose on a regular basis. 140s-160s. 91 was the lowest.   Patient is not up to date on diabetic eye.-discussed going to eye doctor   Hypertension: Patient is a 56 y.o. female who present today for follow up of hypertension.   Patient endorses no problems Denies any headache, vision changes, shortness of breath or chest pain   Home medications include: olmesartan 10 mg daily Patient endorses taking these medications as prescribed.  Most recent creatinine trend:  Lab Results  Component Value Date   CREATININE 0.68 11/19/2021   CREATININE 0.78 11/12/2020   CREATININE 1.03 (H) 05/26/2020   Patient does not check blood pressure at home.   Patient has had a BMP in the past 1 year.  Left ankle swelling Swelling for 2-3 days. Has been elevating   Smoking 1 PPD, interested in quitting. Has tried patches in past.  PERTINENT  PMH / PSH: recent trigger finger repair  OBJECTIVE:   BP 130/82   Pulse 99   Ht '5\' 9"'$  (1.753 m)   Wt 247 lb 9.6 oz (112.3 kg)   LMP 06/23/2013   SpO2 99%   BMI 36.56 kg/m   General: NAD, awake, alert, responsive to questions Head: Normocephalic atraumatic CV: Regular rate and rhythm no murmurs rubs or gallops Respiratory: Clear to ausculation bilaterally, no increased work of  breathing Abdomen: Soft, non-tender  Extremities: Left ankle with lateral malleolar swelling, no point tenderness throughout ankle, ROM intact, strength intact, 2+ pulses  ASSESSMENT/PLAN:   Essential hypertension 130/82 on recheck. -Continue olmesartan 10 mg daily  TOBACCO ABUSE -quit line provided -f/u at next visit  Diabetes mellitus, type 2 A1c 7.1. -Continue Lantus 10 units nightly -Metformin 1000 mg twice daily -Add GLP-1 Wegovy sent to pharmacy -Follow-up in 1 month for medication recheck/see what her sugars have been running  Left ankle swelling No pain and range of motion intact however there is some effusion on left lateral malleoli region.  No pain to palpation.  Could consider venous insufficiency.  Advised compression stockings and to return if worsening or having any symptoms of fevers etc.   Gerrit Heck, MD [place]

## 2022-07-10 NOTE — Patient Instructions (Addendum)
It was great to see you! Thank you for allowing me to participate in your care!   I recommend that you always bring your medications to each appointment as this makes it easy to ensure we are on the correct medications and helps [place] not miss when refills are needed.  Our plans for today:  - Your A1c was 7.1, continue your lantus and metformin I have started wegovy-it is an injection once a week which helps with weight loss and diabetes-we will follow up in 1 month - For ankle swelling-try compression socks return if worsening -You need a mammogram to prevent breast cancer.  Please schedule an appointment.  You can call [phone].     We are checking some labs today, I will call you if they are abnormal will send you a MyChart message or a letter if they are normal.  If you do not hear about your labs in the next 2 weeks please let us know.  Take care and seek immediate care sooner if you develop any concerns. Please remember to show up 15 minutes before your scheduled appointment time!  Gerrit Heck, MD [place]

## 2022-07-10 NOTE — Assessment & Plan Note (Signed)
No pain and range of motion intact however there is some effusion on left lateral malleoli region.  No pain to palpation.  Could consider venous insufficiency.  Advised compression stockings and to return if worsening or having any symptoms of fevers etc.

## 2022-07-10 NOTE — Assessment & Plan Note (Signed)
-  quit line provided -f/u at next visit

## 2022-07-10 NOTE — Assessment & Plan Note (Signed)
A1c 7.1. -Continue Lantus 10 units nightly -Metformin 1000 mg twice daily -Add GLP-1 Wegovy sent to pharmacy -Follow-up in 1 month for medication recheck/see what her sugars have been running

## 2022-07-13 ENCOUNTER — Telehealth: Payer: Self-pay | Admitting: *Deleted

## 2022-07-13 NOTE — Telephone Encounter (Signed)
Semaglutide is unavailable until the end of September or later.   Pt is wondering if there is something else she should try. Christen Bame, CMA

## 2022-07-14 ENCOUNTER — Other Ambulatory Visit: Payer: Self-pay | Admitting: Student

## 2022-07-14 DIAGNOSIS — E119 Type 2 diabetes mellitus without complications: Secondary | ICD-10-CM

## 2022-07-17 ENCOUNTER — Other Ambulatory Visit: Payer: Self-pay | Admitting: Student

## 2022-07-17 DIAGNOSIS — Z09 Encounter for follow-up examination after completed treatment for conditions other than malignant neoplasm: Secondary | ICD-10-CM

## 2022-07-23 NOTE — Telephone Encounter (Signed)
Attempted to contact pt nut pt did not answer. I was unable to leave a vm.

## 2022-07-29 ENCOUNTER — Ambulatory Visit
Admission: RE | Admit: 2022-07-29 | Discharge: 2022-07-29 | Disposition: A | Discharge status: Home / Self Care | Payer: Medicaid Other | Source: Ambulatory Visit | Attending: Obstetrics and Gynecology | Admitting: Obstetrics and Gynecology

## 2022-07-29 ENCOUNTER — Other Ambulatory Visit: Payer: Self-pay | Admitting: Student

## 2022-07-29 DIAGNOSIS — N6320 Unspecified lump in the left breast, unspecified quadrant: Secondary | ICD-10-CM

## 2022-07-29 DIAGNOSIS — Z09 Encounter for follow-up examination after completed treatment for conditions other than malignant neoplasm: Secondary | ICD-10-CM

## 2022-07-29 DIAGNOSIS — N6321 Unspecified lump in the left breast, upper outer quadrant: Secondary | ICD-10-CM | POA: Diagnosis not present

## 2022-07-29 DIAGNOSIS — N6323 Unspecified lump in the left breast, lower outer quadrant: Secondary | ICD-10-CM | POA: Diagnosis not present

## 2022-07-29 DIAGNOSIS — R928 Other abnormal and inconclusive findings on diagnostic imaging of breast: Secondary | ICD-10-CM | POA: Diagnosis not present

## 2022-07-30 ENCOUNTER — Other Ambulatory Visit: Payer: Self-pay | Admitting: Obstetrics and Gynecology

## 2022-07-30 DIAGNOSIS — N6320 Unspecified lump in the left breast, unspecified quadrant: Secondary | ICD-10-CM

## 2022-08-04 ENCOUNTER — Ambulatory Visit
Admission: RE | Admit: 2022-08-04 | Discharge: 2022-08-04 | Disposition: A | Discharge status: Home / Self Care | Payer: Medicaid Other | Source: Ambulatory Visit | Attending: Obstetrics and Gynecology | Admitting: Obstetrics and Gynecology

## 2022-08-04 DIAGNOSIS — N6320 Unspecified lump in the left breast, unspecified quadrant: Secondary | ICD-10-CM

## 2022-08-04 DIAGNOSIS — N6321 Unspecified lump in the left breast, upper outer quadrant: Secondary | ICD-10-CM | POA: Diagnosis not present

## 2022-08-04 DIAGNOSIS — D242 Benign neoplasm of left breast: Secondary | ICD-10-CM | POA: Diagnosis not present

## 2022-08-05 ENCOUNTER — Other Ambulatory Visit: Payer: Self-pay | Admitting: Obstetrics and Gynecology

## 2022-08-05 DIAGNOSIS — N6320 Unspecified lump in the left breast, unspecified quadrant: Secondary | ICD-10-CM

## 2022-08-10 ENCOUNTER — Ambulatory Visit: Payer: Medicaid Other | Admitting: Student

## 2022-08-10 NOTE — Progress Notes (Deleted)
    SUBJECTIVE:   CHIEF COMPLAINT / HPI:   Diabetic Follow Up: Patient is a 56 y.o. female who present today for diabetic follow up.   Patient endorses {[id number]}  Home medications include: lantus 10 units nightly, metformin 1000 mg BID, wegovy *** ACEi/ARB: yes Statin: yes Patient endorses taking these medications as prescribed.***  Most recent A1Cs:  Lab Results  Component Value Date   HGBA1C 7.1 (A) 07/10/2022   HGBA1C 7.3 (A) 11/19/2021   HGBA1C 7.7 (A) 11/12/2020   Last Microalbumin, LDL, Creatinine: Lab Results  Component Value Date   MICROALBUR 0.62 08/15/2014   LDLCALC 62 11/12/2020   CREATININE 0.68 11/19/2021   Patient {[id number]:[id number]} check blood glucose on a regular basis.  PERTINENT  PMH / PSH: tobacco use  OBJECTIVE:   LMP 06/23/2013   ***  ASSESSMENT/PLAN:   No problem-specific Assessment & Plan notes found for this encounter.     Gerrit Heck, MD [place]

## 2022-08-12 ENCOUNTER — Other Ambulatory Visit: Payer: Self-pay | Admitting: Student

## 2022-08-12 DIAGNOSIS — I10 Essential (primary) hypertension: Secondary | ICD-10-CM

## 2022-08-18 ENCOUNTER — Inpatient Hospital Stay: Admission: RE | Admit: 2022-08-18 | Payer: Medicaid Other | Source: Ambulatory Visit

## 2022-08-28 ENCOUNTER — Encounter: Payer: Self-pay | Admitting: Student

## 2022-08-28 ENCOUNTER — Ambulatory Visit (INDEPENDENT_AMBULATORY_CARE_PROVIDER_SITE_OTHER): Payer: Medicaid Other | Admitting: Student

## 2022-08-28 VITALS — BP 134/74 | HR 100 | Ht 69.0 in | Wt 245.2 lb

## 2022-08-28 DIAGNOSIS — I10 Essential (primary) hypertension: Secondary | ICD-10-CM

## 2022-08-28 DIAGNOSIS — Z794 Long term (current) use of insulin: Secondary | ICD-10-CM | POA: Diagnosis not present

## 2022-08-28 DIAGNOSIS — E119 Type 2 diabetes mellitus without complications: Secondary | ICD-10-CM

## 2022-08-28 DIAGNOSIS — Z23 Encounter for immunization: Secondary | ICD-10-CM | POA: Diagnosis not present

## 2022-08-28 DIAGNOSIS — B351 Tinea unguium: Secondary | ICD-10-CM | POA: Diagnosis not present

## 2022-08-28 NOTE — Progress Notes (Signed)
SUBJECTIVE:   CHIEF COMPLAINT / HPI:   Diabetic Follow Up: Patient is a 56 y.o. female who present today for diabetic follow up.   Patient endorses difficulties obtaining wegovy  Home medications include: lantus 10 U nightly (she has only been using it 2-3 times weekly when glucoses are over 150), metformin 1000 mg BID, wegovy started at last visit-however has been unable to obtain it, olmesartan 10 mg daily, rosuvastatin 10 mg daily ACEi/ARB: yes Statin: yes Patient endorses taking these medications as prescribed.  Most recent A1Cs:  Lab Results  Component Value Date   HGBA1C 7.1 (A) 07/10/2022   HGBA1C 7.3 (A) 11/19/2021   HGBA1C 7.7 (A) 11/12/2020   Last Microalbumin, LDL, Creatinine: Lab Results  Component Value Date   MICROALBUR 0.62 08/15/2014   LDLCALC 62 11/12/2020   CREATININE 0.68 11/19/2021   Patient does check blood glucose on a regular basis. 200s in morning.   Hypertension: Patient is a 56 y.o. female who present today for follow up of hypertension.   Patient endorses no problems  Home medications include: Olmesartan 10 mg daily Patient endorses taking these medications as prescribed. Denies any headache, vision changes, shortness of breath, or chest pain   Most recent creatinine trend:  Lab Results  Component Value Date   CREATININE 0.68 11/19/2021   CREATININE 0.78 11/12/2020   CREATININE 1.03 (H) 05/26/2020   Patient does not check blood pressure at home.  Patient has had a BMP in the past 1 year.  Toenail Fungus Patient has had darkening of toenails and thickening of the nails especially on her right great toe.  She denies any pain.  No swelling or fevers that she has had.  She has not tried anything for this.  Health Maintenance Would like flu shot today  PERTINENT  PMH / PSH: Obesity  OBJECTIVE:   BP 134/74   Pulse 100   Ht '5\' 9"'$  (1.753 m)   Wt 245 lb 3.2 oz (111.2 kg)   LMP 06/23/2013   SpO2 99%   BMI 36.21 kg/m   General:  NAD, awake, alert, responsive to questions Head: Normocephalic atraumatic CV: Regular rate and rhythm no murmurs rubs or gallops Respiratory: Clear to ausculation bilaterally, no wheezes rales or crackles, chest rises symmetrically,  no increased work of breathing Abdomen: Soft, non-tender, non-distended, normoactive bowel sounds  Feet: Toenails with darkened and thickened appearance     ASSESSMENT/PLAN:   Diabetes mellitus, type 2 Patient still having blood sugars in the fastings of over 200s.  Has not had any lows.  Only taking Lantus 10 2-3 times weeks.  Has not been able to obtain [facility] yet due to pharmacy shortage. -We will do Lantus 10 units daily now and have her check her blood sugars and keep a log -Continue metformin 1000 mg twice daily -Start [facility] when available at pharmacy -A1c check in November -Continue ARB and statin  Essential hypertension 168/106 initially automatically and on manual recheck was 134/74.  -Continue olmesartan 10 mg daily -Monitor at future visits  Onychomycosis Most prominent on her toenails and her right great toenail the most.  We discussed that systemic antifungals would be indicated and is a long course however would like to get her diabetes more in control before introducing this.  We discussed that this can have systemic effects such as liver toxicity.  I discussed if worsening can consider a podiatry referral. -Monitor -Consider podiatry referral in future   HM -flu shot administered  Rhys Lichty  Jinny Sanders, [place]

## 2022-08-28 NOTE — Patient Instructions (Signed)
It was great to see you! Thank you for allowing me to participate in your care!   Our plans for today:  - Start taking you lantus 10 units daily, continue taking your morning sugars - we will await your wegovy - We will hold off on treating your toenail fungus until diabetes more under control   Take care and seek immediate care sooner if you develop any concerns.  Gerrit Heck, MD

## 2022-08-28 NOTE — Assessment & Plan Note (Signed)
168/106 initially automatically and on manual recheck was 134/74.  -Continue olmesartan 10 mg daily -Monitor at future visits

## 2022-08-28 NOTE — Assessment & Plan Note (Signed)
Most prominent on her toenails and her right great toenail the most.  We discussed that systemic antifungals would be indicated and is a long course however would like to get her diabetes more in control before introducing this.  We discussed that this can have systemic effects such as liver toxicity.  I discussed if worsening can consider a podiatry referral. -Monitor -Consider podiatry referral in future

## 2022-08-28 NOTE — Assessment & Plan Note (Signed)
Patient still having blood sugars in the fastings of over 200s.  Has not had any lows.  Only taking Lantus 10 2-3 times weeks.  Has not been able to obtain [facility] yet due to pharmacy shortage. -We will do Lantus 10 units daily now and have her check her blood sugars and keep a log -Continue metformin 1000 mg twice daily -Start [facility] when available at pharmacy -A1c check in November -Continue ARB and statin

## 2022-08-30 ENCOUNTER — Other Ambulatory Visit: Payer: Self-pay | Admitting: Student

## 2022-08-30 DIAGNOSIS — I10 Essential (primary) hypertension: Secondary | ICD-10-CM

## 2022-09-28 ENCOUNTER — Encounter (HOSPITAL_COMMUNITY): Payer: Self-pay | Admitting: *Deleted

## 2022-09-28 ENCOUNTER — Emergency Department (HOSPITAL_COMMUNITY): Payer: Medicaid Other

## 2022-09-28 ENCOUNTER — Other Ambulatory Visit: Payer: Self-pay

## 2022-09-28 ENCOUNTER — Emergency Department (HOSPITAL_COMMUNITY)
Admission: EM | Admit: 2022-09-28 | Discharge: 2022-09-28 | Disposition: A | Discharge status: Home / Self Care | Payer: Medicaid Other | Attending: Emergency Medicine | Admitting: Emergency Medicine

## 2022-09-28 DIAGNOSIS — Z794 Long term (current) use of insulin: Secondary | ICD-10-CM | POA: Diagnosis not present

## 2022-09-28 DIAGNOSIS — I10 Essential (primary) hypertension: Secondary | ICD-10-CM | POA: Diagnosis not present

## 2022-09-28 DIAGNOSIS — Z7982 Long term (current) use of aspirin: Secondary | ICD-10-CM | POA: Insufficient documentation

## 2022-09-28 DIAGNOSIS — Z79899 Other long term (current) drug therapy: Secondary | ICD-10-CM | POA: Diagnosis not present

## 2022-09-28 DIAGNOSIS — Z87891 Personal history of nicotine dependence: Secondary | ICD-10-CM | POA: Insufficient documentation

## 2022-09-28 DIAGNOSIS — W19XXXA Unspecified fall, initial encounter: Secondary | ICD-10-CM

## 2022-09-28 DIAGNOSIS — R918 Other nonspecific abnormal finding of lung field: Secondary | ICD-10-CM | POA: Diagnosis not present

## 2022-09-28 DIAGNOSIS — R1013 Epigastric pain: Secondary | ICD-10-CM | POA: Insufficient documentation

## 2022-09-28 DIAGNOSIS — R7989 Other specified abnormal findings of blood chemistry: Secondary | ICD-10-CM | POA: Insufficient documentation

## 2022-09-28 DIAGNOSIS — E119 Type 2 diabetes mellitus without complications: Secondary | ICD-10-CM | POA: Insufficient documentation

## 2022-09-28 DIAGNOSIS — W010XXA Fall on same level from slipping, tripping and stumbling without subsequent striking against object, initial encounter: Secondary | ICD-10-CM | POA: Insufficient documentation

## 2022-09-28 DIAGNOSIS — Z7984 Long term (current) use of oral hypoglycemic drugs: Secondary | ICD-10-CM | POA: Diagnosis not present

## 2022-09-28 DIAGNOSIS — R1084 Generalized abdominal pain: Secondary | ICD-10-CM | POA: Diagnosis not present

## 2022-09-28 DIAGNOSIS — M549 Dorsalgia, unspecified: Secondary | ICD-10-CM | POA: Diagnosis not present

## 2022-09-28 DIAGNOSIS — R748 Abnormal levels of other serum enzymes: Secondary | ICD-10-CM | POA: Diagnosis not present

## 2022-09-28 DIAGNOSIS — R0902 Hypoxemia: Secondary | ICD-10-CM | POA: Diagnosis not present

## 2022-09-28 DIAGNOSIS — S22080A Wedge compression fracture of T11-T12 vertebra, initial encounter for closed fracture: Secondary | ICD-10-CM | POA: Insufficient documentation

## 2022-09-28 DIAGNOSIS — S299XXA Unspecified injury of thorax, initial encounter: Secondary | ICD-10-CM | POA: Diagnosis present

## 2022-09-28 DIAGNOSIS — R001 Bradycardia, unspecified: Secondary | ICD-10-CM | POA: Diagnosis not present

## 2022-09-28 LAB — CBC WITH DIFFERENTIAL/PLATELET
Abs Immature Granulocytes: 0.02 10*3/uL (ref 0.00–0.07)
Basophils Absolute: 0 10*3/uL (ref 0.0–0.1)
Basophils Relative: 0 %
Eosinophils Absolute: 0.1 10*3/uL (ref 0.0–0.5)
Eosinophils Relative: 1 %
HCT: 40.5 % (ref 36.0–46.0)
Hemoglobin: 13.5 g/dL (ref 12.0–15.0)
Immature Granulocytes: 0 %
Lymphocytes Relative: 36 %
Lymphs Abs: 3.1 10*3/uL (ref 0.7–4.0)
MCH: 30.6 pg (ref 26.0–34.0)
MCHC: 33.3 g/dL (ref 30.0–36.0)
MCV: 91.8 fL (ref 80.0–100.0)
Monocytes Absolute: 0.5 10*3/uL (ref 0.1–1.0)
Monocytes Relative: 6 %
Neutro Abs: 5 10*3/uL (ref 1.7–7.7)
Neutrophils Relative %: 57 %
Platelets: 347 10*3/uL (ref 150–400)
RBC: 4.41 MIL/uL (ref 3.87–5.11)
RDW: 14.2 % (ref 11.5–15.5)
WBC: 8.7 10*3/uL (ref 4.0–10.5)
nRBC: 0 % (ref 0.0–0.2)

## 2022-09-28 LAB — COMPREHENSIVE METABOLIC PANEL
ALT: 10 U/L (ref 0–44)
AST: 12 U/L — ABNORMAL LOW (ref 15–41)
Albumin: 3.3 g/dL — ABNORMAL LOW (ref 3.5–5.0)
Alkaline Phosphatase: 93 U/L (ref 38–126)
Anion gap: 9 (ref 5–15)
BUN: 9 mg/dL (ref 6–20)
CO2: 23 mmol/L (ref 22–32)
Calcium: 9 mg/dL (ref 8.9–10.3)
Chloride: 106 mmol/L (ref 98–111)
Creatinine, Ser: 0.65 mg/dL (ref 0.44–1.00)
GFR, Estimated: >60 mL/min (ref >60)
Glucose, Bld: 140 mg/dL — ABNORMAL HIGH (ref 70–99)
Potassium: 3.6 mmol/L (ref 3.5–5.1)
Sodium: 138 mmol/L (ref 135–145)
Total Bilirubin: 0.2 mg/dL — ABNORMAL LOW (ref 0.3–1.2)
Total Protein: 6.6 g/dL (ref 6.5–8.1)

## 2022-09-28 LAB — URINALYSIS, ROUTINE W REFLEX MICROSCOPIC
Bacteria, UA: NONE SEEN
Bilirubin Urine: NEGATIVE
Glucose, UA: NEGATIVE mg/dL
Ketones, ur: NEGATIVE mg/dL
Leukocytes,Ua: NEGATIVE
Nitrite: NEGATIVE
Protein, ur: NEGATIVE mg/dL
Specific Gravity, Urine: 1.02 (ref 1.005–1.030)
pH: 5 (ref 5.0–8.0)

## 2022-09-28 LAB — TROPONIN I (HIGH SENSITIVITY)
Troponin I (High Sensitivity): 5 ng/L (ref <18)
Troponin I (High Sensitivity): 7 ng/L (ref <18)

## 2022-09-28 LAB — LIPASE, BLOOD: Lipase: 63 U/L — ABNORMAL HIGH (ref 11–51)

## 2022-09-28 MED ORDER — IOHEXOL 350 MG/ML SOLN
80.0000 mL | Freq: Once | INTRAVENOUS | Status: AC | PRN
  Administered 2022-09-28: 80 mL via INTRAVENOUS

## 2022-09-28 MED ORDER — OXYCODONE-ACETAMINOPHEN 5-325 MG PO TABS: 1.0000 | ORAL_TABLET | Freq: Four times a day (QID) | ORAL | 0 refills | Status: DC | PRN

## 2022-09-28 MED ORDER — ONDANSETRON 8 MG PO TBDP: 8.0000 mg | ORAL_TABLET | Freq: Three times a day (TID) | ORAL | 0 refills | Status: DC | PRN

## 2022-09-28 MED ORDER — KETOROLAC TROMETHAMINE 15 MG/ML IJ SOLN
15.0000 mg | Freq: Once | INTRAMUSCULAR | Status: AC
  Administered 2022-09-28: 15 mg via INTRAVENOUS
  Filled 2022-09-28: qty 1

## 2022-09-28 NOTE — ED Provider Notes (Signed)
[facility] EMERGENCY DEPARTMENT Provider Note   CSN: [id number] Arrival date & time: 09/28/22  0555     History  Chief Complaint  Patient presents with   Abdominal Pain   Back Pain    Hypoxia    Melanie Phillips is a 56 y.o. female.  The history is provided by the patient and the EMS personnel.  Abdominal Pain Back Pain Associated symptoms: abdominal pain      Patient with medical history of diabetes, GERD, hypertension, hyperlipidemia, tobacco abuse, obesity presents today via EMS due to fall, abdominal pain, chest pain.  Patient is a poor historian.  States that she fell either on Friday or Saturday, unsure why she fell but thinks she may have tripped over a root.  She is unsure if she hit her head or lost consciousness.  States she has been having pain since then in her chest, abdomen, back. Came today because pain wasn't getting better. Denies any nausea, vomiting, vision changes, hematuria, hemoptysis, saddle seizure, urinary retention, fecal incontinence.  Patient is able to ambulate but this elicits pain.  Given fentanyl by EMS, was hypoxic in triage and brought back to room.    Home Medications Prior to Admission medications   Medication Sig Start Date End Date Taking? Authorizing Provider  aspirin EC 81 MG tablet Take 1 tablet (81 mg total) by mouth daily. Patient taking differently: Take 81 mg by mouth every evening. 08/15/14  Yes Luiz Blare Y, DO  insulin glargine (LANTUS) 100 UNIT/ML Solostar Pen Inject 10 Units into the skin at bedtime. Patient taking differently: Inject 10 Units into the skin at bedtime as needed (if CBG is > 150). 12/13/21  Yes Brimage, Vondra, DO  metFORMIN (GLUCOPHAGE) 1000 MG tablet Take 1 tablet (1,000 mg total) by mouth 2 (two) times daily. 02/24/22  Yes Welborn, Ryan, DO  olmesartan (BENICAR) 5 MG tablet TAKE 2 TABLETS BY MOUTH EVERY DAY Patient taking differently: Take 10 mg by mouth daily. 08/31/22  Yes Gerrit Heck, MD   omeprazole (PRILOSEC) 40 MG capsule Take 1 capsule (40 mg total) by mouth daily. 08/28/19  Yes Enid Derry, [place], DO  ondansetron (ZOFRAN-ODT) 8 MG disintegrating tablet Take 1 tablet (8 mg total) by mouth every 8 (eight) hours as needed for nausea or vomiting. 09/28/22  Yes Sherrill Raring, PA-C  oxyCODONE-acetaminophen (PERCOCET/ROXICET) 5-325 MG tablet Take 1 tablet by mouth every 6 (six) hours as needed for severe pain. 09/28/22  Yes Sherrill Raring, PA-C  rosuvastatin (CRESTOR) 10 MG tablet Take 1 tablet (10 mg total) by mouth daily. 11/19/21  Yes Brimage, Ronnette Juniper, DO  Accu-Chek FastClix Lancets MISC Use as needed to check blood sugar. 05/28/20   Nuala Alpha, MD  Blood Glucose Monitoring Suppl (ACCU-CHEK GUIDE) w/Device KIT 1 Device by Does not apply route in the morning and at bedtime. 05/28/20   Nuala Alpha, MD  glucose blood (ACCU-CHEK GUIDE) test strip Use as instructed 07/10/22   Gerrit Heck, MD  Insulin Pen Needle (BD PEN NEEDLE NANO 2ND GEN) 32G X 4 MM MISC 1 Units by Does not apply route 4 (four) times daily -  before meals and at bedtime. 12/13/21   Lyndee Hensen, DO  Semaglutide-Weight Management 0.25 MG/0.5ML SOAJ Inject 0.25 mg into the skin once a week. Patient not taking: Reported on 09/28/2022 07/10/22   Gerrit Heck, MD      Allergies    Hydrocodone    Review of Systems   Review of Systems  Gastrointestinal:  Positive  for abdominal pain.  Musculoskeletal:  Positive for back pain.    Physical Exam Updated Vital Signs BP (!) 162/90 (BP Location: Right Arm)   Pulse 76   Temp 98.1 F (36.7 C) (Oral)   Resp 18   LMP 06/23/2013   SpO2 98%  Physical Exam Vitals and nursing note reviewed. Exam conducted with a chaperone present.  Constitutional:      Appearance: Normal appearance. She is obese.  HENT:     Head: Normocephalic and atraumatic.  Eyes:     General: No scleral icterus.       Right eye: No discharge.        Left eye: No discharge.     Extraocular  Movements: Extraocular movements intact.     Pupils: Pupils are equal, round, and reactive to light.  Neck:     Comments: Patient in c-collar Cardiovascular:     Rate and Rhythm: Normal rate and regular rhythm.     Pulses: Normal pulses.     Heart sounds: Normal heart sounds. No murmur heard.    No friction rub. No gallop.  Pulmonary:     Effort: Pulmonary effort is normal. No respiratory distress.     Breath sounds: Normal breath sounds.  Abdominal:     General: Abdomen is flat. Bowel sounds are normal. There is no distension.     Palpations: Abdomen is soft.     Tenderness: There is abdominal tenderness in the epigastric area. There is guarding.  Musculoskeletal:     Comments: Moving upper and lower extremities without any difficulty.  Able to raise both lower extremities against gravity  Skin:    General: Skin is warm and dry.     Coloration: Skin is not jaundiced.  Neurological:     Mental Status: She is alert. Mental status is at baseline.     Coordination: Coordination normal.     Comments: Follows commands.  No dysarthria.  Upper and lower extremity strength symmetric bilaterally.     ED Results / Procedures / Treatments   Labs (all labs ordered are listed, but only abnormal results are displayed) Labs Reviewed  COMPREHENSIVE METABOLIC PANEL - Abnormal; Notable for the following components:      Result Value   Glucose, Bld 140 (*)    Albumin 3.3 (*)    AST 12 (*)    Total Bilirubin 0.2 (*)    All other components within normal limits  LIPASE, BLOOD - Abnormal; Notable for the following components:   Lipase 63 (*)    All other components within normal limits  URINALYSIS, ROUTINE W REFLEX MICROSCOPIC - Abnormal; Notable for the following components:   Hgb urine dipstick SMALL (*)    All other components within normal limits  CBC WITH DIFFERENTIAL/PLATELET  TROPONIN I (HIGH SENSITIVITY)  TROPONIN I (HIGH SENSITIVITY)    EKG EKG  Interpretation  Date/Time:  Monday September 28 2022 06:10:36 EDT Ventricular Rate:  77 PR Interval:  172 QRS Duration: 114 QT Interval:  436 QTC Calculation: 493 R Axis:   -14 Text Interpretation: Normal sinus rhythm Cannot rule out Anterior infarct , age undetermined Abnormal ECG When compared with ECG of 26-Nov-2019 03:11, No significant change since last tracing Confirmed by Dorie Rank [phone]) on 09/28/2022 7:09:31 AM  Radiology CT L-SPINE NO CHARGE  Result Date: 09/28/2022 CLINICAL DATA:  56 year old female status post fall two days ago. Severe pain. Hypoxia. EXAM: CT LUMBAR SPINE WITH CONTRAST TECHNIQUE: Technique: Multiplanar CT images of the lumbar spine  were reconstructed from contemporary CT of the Abdomen and Pelvis. RADIATION DOSE REDUCTION: This exam was performed according to the departmental dose-optimization program which includes automated exposure control, adjustment of the mA and/or kV according to patient size and/or use of iterative reconstruction technique. CONTRAST:  No additional COMPARISON:  CT thoracic spine, CTA Chest, CT abdomen, and Pelvis today are reported separately. FINDINGS: Segmentation: Normal, concordant with the thoracic numbering today. Alignment: Relatively normal lumbar lordosis. No significant spondylolisthesis or scoliosis. Vertebrae: Osteopenia. L1 superior endplate compression fracture with up to 45% loss of vertebral body height anteriorly on the right. No retropulsion. L1 pedicles and posterior elements appear intact and aligned. L2 is intact. Mild L3 superior endplate compression fracture. 30% loss of height. No retropulsion. L3 pedicles and posterior elements appear intact and aligned. L4 and L5 appear intact.  Intact visible sacrum and SI joints. Paraspinal and other soft tissues: Abdominal and pelvic viscera are detailed separately. Lumbar paraspinal soft tissues remain within normal limits. Disc levels: Lumbar spine degeneration appears largely  age-appropriate, with no CT evidence of lumbar spinal stenosis. IMPRESSION: 1. Osteopenia with age indeterminate L1 > L3 compression fractures. No retropulsion or complicating features. If specific therapy such as vertebroplasty is desired, Lumbar MRI or Nuclear Medicine Whole-body Bone Scan would best determine acuity. 2. Age-appropriate lumbar spine degeneration. CT Abdomen and Pelvis today reported separately. Electronically Signed   By: Genevie Ann M.D.   On: 09/28/2022 09:15   CT T-SPINE NO CHARGE  Result Date: 09/28/2022 CLINICAL DATA:  56 year old female status post fall two days ago. Severe pain. Hypoxia. EXAM: CT THORACIC SPINE WITH CONTRAST TECHNIQUE: Multiplanar CT images of the thoracic spine were reconstructed from contemporary CT of the Chest. RADIATION DOSE REDUCTION: This exam was performed according to the departmental dose-optimization program which includes automated exposure control, adjustment of the mA and/or kV according to patient size and/or use of iterative reconstruction technique. CONTRAST:  No additional COMPARISON:  CTA chest and CT lumbar spine today today. FINDINGS: Limited cervical spine imaging: Cervicothoracic junction alignment is within normal limits. Thoracic spine segmentation:  Normal. Alignment: Relatively normal thoracic kyphosis. No spondylolisthesis or scoliosis. Vertebrae: Generalized osteopenia. Thoracic vertebrae through T11 appear intact. T12 superior endplate compression appears age indeterminate. No retropulsion. T12 pedicles and posterior elements appear intact. 30% central loss of vertebral body height. Visible posterior ribs are intact. Paraspinal and other soft tissues: Chest and abdominal viscera are reported separately. Thoracic paraspinal soft tissues are within normal limits. Disc levels: Widespread thoracic disc and endplate degeneration. Mostly anterior degenerative endplate spurring. No CT evidence of thoracic spinal stenosis. IMPRESSION: 1. Age  indeterminate T12 superior endplate compression fracture with 30% loss of vertebral body height. No retropulsion or complicating features. 2. No other acute osseous abnormality identified in the thoracic spine. Electronically Signed   By: Genevie Ann M.D.   On: 09/28/2022 09:08   CT Abdomen Pelvis W Contrast  Result Date: 09/28/2022 CLINICAL DATA:  56 year old female status post fall two days ago. Severe pain. Hypoxia. EXAM: CT ABDOMEN AND PELVIS WITH CONTRAST TECHNIQUE: Multidetector CT imaging of the abdomen and pelvis was performed using the standard protocol following bolus administration of intravenous contrast. RADIATION DOSE REDUCTION: This exam was performed according to the departmental dose-optimization program which includes automated exposure control, adjustment of the mA and/or kV according to patient size and/or use of iterative reconstruction technique. CONTRAST:  55m OMNIPAQUE IOHEXOL 350 MG/ML SOLN COMPARISON:  CTA chest and CT lumbar spine today reported separately. FINDINGS: Lower  chest: Detailed separately today. Hepatobiliary: Negative liver and gallbladder. Pancreas: Generalized prominence of the main pancreatic duct. No pancreatic mass or inflammation. No intrahepatic biliary ductal dilatation. Spleen: Diminutive and negative. Adrenals/Urinary Tract: Normal adrenal glands. Multifocal chronic right renal cortical scarring. But symmetric enhancement of the residual right renal parenchyma. Normal appearance of the left kidney; lower pole simple fluid density cyst appears benign (no follow-up imaging recommended). Still, there is evidence of punctate left upper pole nephrolithiasis. But no hydronephrosis or pararenal inflammation. Both ureters are decompressed. Moderate distension of the urinary bladder which otherwise appears negative. Multiple pelvic phleboliths incidentally noted. Stomach/Bowel: Redundant sigmoid colon tracks through the right abdomen. Most of the large bowel from the splenic  flexure to the rectum is decompressed. Redundant transverse colon with moderate retained gas and stool. Similar retained stool in the right colon. Normal appendix on series 1, image 62. No dilated small bowel. Stomach is decompressed. No free air or free fluid. Vascular/Lymphatic: Normal caliber abdominal aorta. Aortoiliac calcified atherosclerosis. Major arterial structures in the abdomen and pelvis are patent. Portal venous system is patent. No lymphadenopathy identified. Reproductive: Within normal limits. Other: No pelvis free fluid. Musculoskeletal: Lumbar spine detailed separately today. Visible sacrum, SI joints, pelvis, and proximal femurs appear intact. IMPRESSION: 1. No acute or inflammatory process identified in the abdomen or pelvis. Lumbar spine CT detailed separately. 2. Redundant large bowel with retained stool. 3. Moderate chronic scarring of the right kidney. Punctate left nephrolithiasis with no obstructive uropathy. 4.  Aortic Atherosclerosis (ICD10-I70.0). Electronically Signed   By: Genevie Ann M.D.   On: 09/28/2022 09:04   CT Angio Chest PE W/Cm &/Or Wo Cm  Result Date: 09/28/2022 CLINICAL DATA:  56 year old female status post fall two days ago. Severe pain. Hypoxia. EXAM: CT ANGIOGRAPHY CHEST WITH CONTRAST TECHNIQUE: Multidetector CT imaging of the chest was performed using the standard protocol during bolus administration of intravenous contrast. Multiplanar CT image reconstructions and MIPs were obtained to evaluate the vascular anatomy. RADIATION DOSE REDUCTION: This exam was performed according to the departmental dose-optimization program which includes automated exposure control, adjustment of the mA and/or kV according to patient size and/or use of iterative reconstruction technique. CONTRAST:  6m OMNIPAQUE IOHEXOL 350 MG/ML SOLN COMPARISON:  Thoracic spine CT, CT Abdomen and Pelvis today reported separately. Portable chest 0626 hours today. FINDINGS: Cardiovascular: Good contrast  bolus timing in the pulmonary arterial tree. Mild respiratory motion. No central or saddle embolus. No convincing segmental or distal pulmonary artery filling defect. No cardiomegaly or pericardial effusion. Little contrast in the thoracic aorta which appears negative. Mediastinum/Nodes: Mild reactive appearing mediastinal and hilar lymph nodes. Lungs/Pleura: Low lung volumes with atelectatic changes to the major airways which remain patent. Mildly asymmetric dependent both sub solid and solid pulmonary opacity. Suspicion of widespread subpleural lung scarring, possible developing upper lobe honeycombing in areas. No convincing pleural effusion. The most confluent dependent lower lobe opacity is not definitely enhancing. Upper Abdomen: CT Abdomen and Pelvis reported separately. Musculoskeletal: Thoracic spine CT reported separately. Visible shoulder osseous structures and sternum appear intact. No rib fracture identified. Review of the MIP images confirms the above findings. IMPRESSION: 1. No pulmonary embolus identified. 2. Low lung volumes with atelectasis, and suspicion of underlying chronic lung disease possibly with early areas of pulmonary fibrosis. Confluent dependent opacity such that lower lobe infection/pneumonia is difficult to exclude. No pleural effusion. 3. No acute traumatic injury identified in the chest. CT Abdomen and Pelvis and Thoracic spine CT reported separately. Electronically Signed  By: Genevie Ann M.D.   On: 09/28/2022 08:59   CT Head Wo Contrast  Result Date: 09/28/2022 CLINICAL DATA:  Head neck trauma. EXAM: CT HEAD WITHOUT CONTRAST CT CERVICAL SPINE WITHOUT CONTRAST TECHNIQUE: Multidetector CT imaging of the head and cervical spine was performed following the standard protocol without intravenous contrast. Multiplanar CT image reconstructions of the cervical spine were also generated. RADIATION DOSE REDUCTION: This exam was performed according to the departmental dose-optimization  program which includes automated exposure control, adjustment of the mA and/or kV according to patient size and/or use of iterative reconstruction technique. COMPARISON:  November 03, 2016. FINDINGS: CT HEAD FINDINGS Brain: No evidence of acute infarction, hemorrhage, hydrocephalus, extra-axial collection or mass lesion/mass effect. Vascular: No hyperdense vessel or unexpected calcification. Skull: Normal. Negative for fracture or focal lesion. Sinuses/Orbits: No acute finding. Other: None. CT CERVICAL SPINE FINDINGS Alignment: Normal. Skull base and vertebrae: No acute fracture. No primary bone lesion or focal pathologic process. Soft tissues and spinal canal: No prevertebral fluid or swelling. No visible canal hematoma. Disc levels: Mild degenerative disc disease is noted at C5-6 and C6-7 with anterior osteophyte formation. Upper chest: Negative. Other: Degenerative changes seen involving the left-sided posterior facet joint of C3-4. IMPRESSION: No acute intracranial abnormality seen. Mild multilevel degenerative disc disease is noted in the cervical spine. No acute abnormality is seen. Electronically Signed   By: Marijo Conception M.D.   On: 09/28/2022 08:58   CT Cervical Spine Wo Contrast  Result Date: 09/28/2022 CLINICAL DATA:  Head neck trauma. EXAM: CT HEAD WITHOUT CONTRAST CT CERVICAL SPINE WITHOUT CONTRAST TECHNIQUE: Multidetector CT imaging of the head and cervical spine was performed following the standard protocol without intravenous contrast. Multiplanar CT image reconstructions of the cervical spine were also generated. RADIATION DOSE REDUCTION: This exam was performed according to the departmental dose-optimization program which includes automated exposure control, adjustment of the mA and/or kV according to patient size and/or use of iterative reconstruction technique. COMPARISON:  November 03, 2016. FINDINGS: CT HEAD FINDINGS Brain: No evidence of acute infarction, hemorrhage, hydrocephalus,  extra-axial collection or mass lesion/mass effect. Vascular: No hyperdense vessel or unexpected calcification. Skull: Normal. Negative for fracture or focal lesion. Sinuses/Orbits: No acute finding. Other: None. CT CERVICAL SPINE FINDINGS Alignment: Normal. Skull base and vertebrae: No acute fracture. No primary bone lesion or focal pathologic process. Soft tissues and spinal canal: No prevertebral fluid or swelling. No visible canal hematoma. Disc levels: Mild degenerative disc disease is noted at C5-6 and C6-7 with anterior osteophyte formation. Upper chest: Negative. Other: Degenerative changes seen involving the left-sided posterior facet joint of C3-4. IMPRESSION: No acute intracranial abnormality seen. Mild multilevel degenerative disc disease is noted in the cervical spine. No acute abnormality is seen. Electronically Signed   By: Marijo Conception M.D.   On: 09/28/2022 08:58   DG Pelvis Portable  Result Date: 09/28/2022 CLINICAL DATA:  Status post fall while walking the dog. EXAM: PORTABLE PELVIS 1-2 VIEWS COMPARISON:  None Available. FINDINGS: Exam detail is diminished secondary to body habitus. The right iliac wing is excluded from the field of view. Both hips appear located and intact. No signs of acute fracture or dislocation. IMPRESSION: 1. No acute findings. 2. Diminished exam detail secondary to body habitus. Electronically Signed   By: Kerby Moors M.D.   On: 09/28/2022 07:13   DG Chest 1 View  Result Date: 09/28/2022 CLINICAL DATA:  Status post fall while walking the dog on Saturday morning. EXAM: CHEST  1 VIEW COMPARISON:  11/26/2019 FINDINGS: Stable cardiomediastinal contours. The lungs are suboptimally inflated. No signs of pleural effusion or interstitial edema. No airspace consolidation. Linear opacities within the periphery of the left lower lung identified with surrounding architectural distortion and volume loss. The visualized osseous structures appear grossly intact. IMPRESSION:  1. Linear opacities within the periphery of the left lower lung with surrounding architectural distortion and volume loss. This is favored to represent an area of age indeterminate posttraumatic or postinflammatory scarring. New compared with 11/26/2019. Electronically Signed   By: Kerby Moors M.D.   On: 09/28/2022 07:12    Procedures Procedures    Medications Ordered in ED Medications  iohexol (OMNIPAQUE) 350 MG/ML injection 80 mL (80 mLs Intravenous Contrast Given 09/28/22 0822)  ketorolac (TORADOL) 15 MG/ML injection 15 mg (15 mg Intravenous Given 09/28/22 6834)    ED Course/ Medical Decision Making/ A&P                           Medical Decision Making Amount and/or Complexity of Data Reviewed Radiology: ordered.  Risk Prescription drug management.   Patient presents due to fall.  Differential includes not limited to fracture, dislocation, AKI/change, check, process, intrathoracic process.  I ordered, viewed and interpreted laboratory work-up. CBC without leukocytosis or anemia. CMP without gross electrolyte derangement or AKI. Lipase mildly elevated at 63 Troponin low at 5. UA is pending.  I ordered and reviewed imaging.  Agree with radiologist interpretation. Plain film of pelvis no acute process. Plain film of chest x-ray age-indeterminate linear opacity infection versus postinflammatory change. CT cervical spine without contrast no acute process. CT head no acute process. CTA PE study negative for acute findings, possible lower lung disease and chronic lung disease. CT abdomen pelvis with contrast negative for acute process. CT T-spine and L-spine notable for age-indeterminate T12 compression fracture, 1. Osteopenia with age indeterminate L1 > L3 compression fractures  I reviewed patient's medication list I ordered Toradol for pain.  I reevaluated the patient.  Pain improved.  No longer hypoxic, maintaining on room air with 100%.  Ambulates with no desaturation.   Suspect the acute hypoxia was secondary to fentanyl use in triage given it has not been present at the remaining of the ED stay.  Initially considered admission given new hypoxia, however given reassuring work-up I do not think indicated.  T SLO brace provided secondary to age-indeterminate fractures.  Will patient follow-up with neurosurgery.  She is not having any cough, fevers, leukocytosis or findings suggestive of any pneumonia.  Discussed she will need to follow-up with her primary regarding possible signs of chronic lung disease on her CT scan.  Return precautions were discussed, patient stabilized and appropriate for outpatient follow-up.        Final Clinical Impression(s) / ED Diagnoses Final diagnoses:  Abnormal CT scan of lung  Fall, initial encounter  Compression fracture of T12 vertebra, initial encounter [facility])    Rx / DC Orders ED Discharge Orders          Ordered    ondansetron (ZOFRAN-ODT) 8 MG disintegrating tablet  Every 8 hours PRN        09/28/22 1022    oxyCODONE-acetaminophen (PERCOCET/ROXICET) 5-325 MG tablet  Every 6 hours PRN        09/28/22 1022              Sherrill Raring, PA-C 09/28/22 1103    Dorie Rank, MD 09/29/22 [phone]

## 2022-09-28 NOTE — Progress Notes (Signed)
Orthopedic Tech Progress Note Patient Details:  Melanie Phillips 1966-05-01 [id number]  Called in order to [employer] for a TLSO BRACE   Patient ID: Melanie Phillips, female   DOB: 20-Oct-1966, 56 y.o.   MRN: [id number]  Janit Pagan 09/28/2022, 10:33 AM

## 2022-09-28 NOTE — ED Triage Notes (Signed)
Pt arrived by [facility]. Pt had fall on Saturday where she fell straight back and is reporting pain across her back pelvic area. LBM was on Wednesday and she normally has no problems with being regular.. Pt complaining of severe abdominal pain, tender to palpate, and distention. Pt was hypoxic in room and now on 02. Pt did receive Fentanyl 262mg by EMS and pain did decrease to 5/10.  CBG 157

## 2022-09-28 NOTE — Discharge Instructions (Signed)
You were seen today in the emergency department due to fall.  You have a compression fracture of your T12 vertebrae.  There is also age-indeterminate compression fractures L1-L3 which may have been there previously before today.  Wear the brace as prescribed.  Take the pain medicine every 6 hours as needed with the nausea medicine.  Continue your other home medicine.  Call your primary set up a follow-up, you do not have 1 information as above.  He had a slightly abnormal CT scan of your lungs which will need to repeat imaging.  Additionally regarding the fractures you need to call and schedule appointment with neurosurgery.  Information above.  Return to the ED for new or concerning symptoms.

## 2022-10-06 ENCOUNTER — Other Ambulatory Visit: Payer: Self-pay | Admitting: Family Medicine

## 2022-10-06 ENCOUNTER — Other Ambulatory Visit: Payer: Self-pay | Admitting: Student

## 2022-10-06 DIAGNOSIS — S22080A Wedge compression fracture of T11-T12 vertebra, initial encounter for closed fracture: Secondary | ICD-10-CM | POA: Diagnosis not present

## 2022-10-06 DIAGNOSIS — Z6837 Body mass index (BMI) 37.0-37.9, adult: Secondary | ICD-10-CM | POA: Diagnosis not present

## 2022-10-06 DIAGNOSIS — Z794 Long term (current) use of insulin: Secondary | ICD-10-CM

## 2022-10-06 DIAGNOSIS — I10 Essential (primary) hypertension: Secondary | ICD-10-CM

## 2022-10-06 DIAGNOSIS — S32010A Wedge compression fracture of first lumbar vertebra, initial encounter for closed fracture: Secondary | ICD-10-CM | POA: Diagnosis not present

## 2022-10-07 ENCOUNTER — Ambulatory Visit: Payer: Medicaid Other

## 2022-10-07 NOTE — Patient Instructions (Incomplete)
It was wonderful to see you today.  Please bring ALL of your medications with you to every visit.   Today we talked about:  **  Thank you for coming to your visit as scheduled. We have had a large "no-show" problem lately, and this significantly limits our ability to see and care for patients. As a friendly reminder- if you cannot make your appointment please call to cancel. We do have a no show policy for those who do not cancel within 24 hours. Our policy is that if you miss or fail to cancel an appointment within 24 hours, 3 times in a 6-month period, you may be dismissed from our clinic.   Thank you for choosing  Family Medicine.   Please call [phone] with any questions about today's appointment.  Please be sure to schedule follow up at the front  desk before you leave today.   Kayleah Appleyard, DO PGY-3 Family Medicine   

## 2022-10-07 NOTE — Progress Notes (Deleted)
    SUBJECTIVE:   CHIEF COMPLAINT / HPI:   TERAN DAUGHENBAUGH is a 56 y.o. female who presents to the [facility] clinic today to discuss the following concerns:   ED follow up Patient was seen in the ED on 10/30 brought in by EMS for abdominal pain, chest pain, and back pain after fall.  She did become acutely hypoxic after receiving fentanyl by EMS. Imaging was notable for compression fracutes of L1>L3 and age indeterminate T12 superior endplate compression fracture with 30% loss.  CT head and C-spine without acute change. CTA chest without evidence of PE but did show low lung volumes with atelectasis and suspicion of underlying chronic lung disease with early areas of pulmonary fibrosis.  PERTINENT  PMH / PSH: Hypertension, type 2 diabetes, depression, tobacco use disorder, obesity  OBJECTIVE:   LMP 06/23/2013  ***  General: NAD, pleasant, able to participate in exam Cardiac: RRR, no murmurs. Respiratory: CTAB, normal effort, No wheezes, rales or rhonchi Abdomen: Bowel sounds present, nontender, nondistended, no hepatosplenomegaly. Extremities: no edema or cyanosis. Skin: warm and dry, no rashes noted Neuro: alert, no obvious focal deficits Psych: Normal affect and mood  ASSESSMENT/PLAN:   No problem-specific Assessment & Plan notes found for this encounter.     Sharion Settler, [place]

## 2022-10-16 ENCOUNTER — Ambulatory Visit: Payer: Medicaid Other | Admitting: Student

## 2022-10-16 NOTE — Progress Notes (Deleted)
    SUBJECTIVE:   CHIEF COMPLAINT / HPI:   Diabetic Follow Up: Patient is a 56 y.o. female who present today for diabetic follow up.   Patient endorses {rwdmsmartlistproblems:24882}  Home medications include: metformin 1000 mg BID, lantus 10 units daily, wegovy  ACEi/ARB: yes olmesartan 10 Statin: yes rosuvastatin 10 Patient endorses taking these medications as prescribed.***  Most recent A1Cs:  Lab Results  Component Value Date   HGBA1C 7.1 (A) 07/10/2022   HGBA1C 7.3 (A) 11/19/2021   HGBA1C 7.7 (A) 11/12/2020   Last Microalbumin, LDL, Creatinine: Lab Results  Component Value Date   MICROALBUR 0.62 08/15/2014   LDLCALC 62 11/12/2020   CREATININE 0.65 09/28/2022   Patient {rwdoesdoesnot:24881} check blood glucose on a regular basis.  Hypertension: Patient is a 56 y.o. female who present today for follow up of hypertension.   Patient endorses {rwdmsmartlistproblems:24882}  Home medications include: olmesartan 10 mg Patient endorses taking these medications as prescribed.*** Denies any headache, vision changes, shortness of breath, lower extremity swelling or chest pain   Most recent creatinine trend:  Lab Results  Component Value Date   CREATININE 0.65 09/28/2022   CREATININE 0.68 11/19/2021   CREATININE 0.78 11/12/2020   Patient {rwdoesdoesnot:24881} check blood pressure at home.  Patient {HAS HAS NBZ:96728} had a BMP in the past 1 year.  PERTINENT  PMH / PSH: ***  OBJECTIVE:   LMP 06/23/2013   ***  ASSESSMENT/PLAN:   No problem-specific Assessment & Plan notes found for this encounter.     Gerrit Heck, MD [facility]

## 2022-11-02 NOTE — Progress Notes (Unsigned)
    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  CT T-spine and L-spine notable for age-indeterminate T12 compression fracture, 1. Osteopenia with age indeterminate L1 > L3 compression fractures  Diabetic Follow Up: Patient is a 56 y.o. female who present today for diabetic follow up.   Patient endorses {rwdmsmartlistproblems:24882}  Home medications include:  lantus 10 U nightly, metformin 1000 mg BID, wegovy***, olmesartan 10 mg daily, rosuvastatin 10 mg daily Patient endorses taking these medications as prescribed.***  Most recent A1Cs:  Lab Results  Component Value Date   HGBA1C 7.1 (A) 07/10/2022   HGBA1C 7.3 (A) 11/19/2021   HGBA1C 7.7 (A) 11/12/2020   Last Microalbumin, LDL, Creatinine: Lab Results  Component Value Date   MICROALBUR 0.62 08/15/2014   LDLCALC 62 11/12/2020   CREATININE 0.65 09/28/2022   Patient {rwdoesdoesnot:24881} check blood glucose on a regular basis.  Patient {rwisisnot:24883} up to date on diabetic eye. Patient {rwisisnot:24883} up to date on diabetic foot exam.  PERTINENT  PMH / PSH: ***  OBJECTIVE:   LMP 06/23/2013   ***  ASSESSMENT/PLAN:   No problem-specific Assessment & Plan notes found for this encounter.     Gerrit Heck, MD [place]

## 2022-11-03 ENCOUNTER — Ambulatory Visit: Payer: Medicaid Other | Admitting: Student

## 2022-11-04 DIAGNOSIS — Z6836 Body mass index (BMI) 36.0-36.9, adult: Secondary | ICD-10-CM | POA: Diagnosis not present

## 2022-11-04 DIAGNOSIS — S22080A Wedge compression fracture of T11-T12 vertebra, initial encounter for closed fracture: Secondary | ICD-10-CM | POA: Diagnosis not present

## 2022-11-06 ENCOUNTER — Other Ambulatory Visit: Payer: Self-pay | Admitting: Student

## 2022-11-06 DIAGNOSIS — I10 Essential (primary) hypertension: Secondary | ICD-10-CM

## 2022-12-10 DIAGNOSIS — Z6836 Body mass index (BMI) 36.0-36.9, adult: Secondary | ICD-10-CM | POA: Diagnosis not present

## 2022-12-10 DIAGNOSIS — S22080A Wedge compression fracture of T11-T12 vertebra, initial encounter for closed fracture: Secondary | ICD-10-CM | POA: Diagnosis not present

## 2022-12-14 ENCOUNTER — Encounter: Payer: Self-pay | Admitting: Gastroenterology

## 2023-01-13 DIAGNOSIS — S22080A Wedge compression fracture of T11-T12 vertebra, initial encounter for closed fracture: Secondary | ICD-10-CM | POA: Diagnosis not present

## 2023-02-11 ENCOUNTER — Other Ambulatory Visit: Payer: Self-pay

## 2023-02-11 MED ORDER — INSULIN GLARGINE 100 UNIT/ML SOLOSTAR PEN: 10.0000 [IU] | PEN_INJECTOR | Freq: Every day | SUBCUTANEOUS | 1 refills | Status: DC

## 2023-02-24 DIAGNOSIS — M5459 Other low back pain: Secondary | ICD-10-CM | POA: Diagnosis not present

## 2023-02-24 DIAGNOSIS — S22080A Wedge compression fracture of T11-T12 vertebra, initial encounter for closed fracture: Secondary | ICD-10-CM | POA: Diagnosis not present

## 2023-02-24 DIAGNOSIS — Z6836 Body mass index (BMI) 36.0-36.9, adult: Secondary | ICD-10-CM | POA: Diagnosis not present

## 2023-03-04 ENCOUNTER — Ambulatory Visit: Payer: Medicaid Other | Admitting: Student

## 2023-03-04 ENCOUNTER — Encounter: Payer: Self-pay | Admitting: Student

## 2023-03-04 VITALS — BP 130/89 | HR 105 | Ht 69.0 in | Wt 242.0 lb

## 2023-03-04 DIAGNOSIS — E119 Type 2 diabetes mellitus without complications: Secondary | ICD-10-CM

## 2023-03-04 DIAGNOSIS — B351 Tinea unguium: Secondary | ICD-10-CM

## 2023-03-04 DIAGNOSIS — I10 Essential (primary) hypertension: Secondary | ICD-10-CM | POA: Diagnosis not present

## 2023-03-04 DIAGNOSIS — Z794 Long term (current) use of insulin: Secondary | ICD-10-CM

## 2023-03-04 DIAGNOSIS — M79671 Pain in right foot: Secondary | ICD-10-CM | POA: Diagnosis not present

## 2023-03-04 DIAGNOSIS — F17200 Nicotine dependence, unspecified, uncomplicated: Secondary | ICD-10-CM | POA: Diagnosis not present

## 2023-03-04 LAB — POCT GLYCOSYLATED HEMOGLOBIN (HGB A1C): HbA1c, POC (controlled diabetic range): 7.4 % — AB (ref 0.0–7.0)

## 2023-03-04 MED ORDER — MOUNJARO 2.5 MG/0.5ML ~~LOC~~ SOAJ
2.5000 mg | SUBCUTANEOUS | 3 refills | Status: DC
Start: 2023-03-04 — End: 2023-08-17

## 2023-03-04 MED ORDER — VARENICLINE TARTRATE 1 MG PO TABS: 1.0000 mg | ORAL_TABLET | Freq: Two times a day (BID) | ORAL | 0 refills | Status: DC

## 2023-03-04 MED ORDER — OLMESARTAN MEDOXOMIL 20 MG PO TABS
20.0000 mg | ORAL_TABLET | Freq: Every day | ORAL | 3 refills | Status: DC
Start: 2023-03-04 — End: 2023-08-17

## 2023-03-04 NOTE — Assessment & Plan Note (Signed)
Right foot has running for the last 2 to 3 weeks.  No trauma to the area.  She is not wearing very supportive shoes currently.  She is interested in sports medicine referral for potential orthotics/evaluation.  Pain is improved and controlled today. -Sports medicine referral placed

## 2023-03-04 NOTE — Assessment & Plan Note (Signed)
A1c overall controlled at 7.4.  She has been taking Lantus intermittently. -Discussed taking Lantus 10 units daily -Metformin 1000 mg twice daily -Change semaglutide to [facility]

## 2023-03-04 NOTE — Progress Notes (Signed)
SUBJECTIVE:   CHIEF COMPLAINT / HPI:   Foot Pain Right foot pain for last 2-3 weeks Nothing that happened to it and no trauma Pain is not as bad today however it still painful when pressing She feels it on the plantar surface near the third metatarsal Has not been walking barefoot and there has been no ulcers No fevers Soometimes get a sharp pain especially when she puts pressure on it with walking  Diabetic Follow Up: Patient is a 57 y.o. female who present today for diabetic follow up.   Patient endorses difficulties obtaining semaglutide, has been taking Lantus only as needed (glucose over 150)  Home medications include: Lantus 10 units daily (see above), metformin 1000 mg twice daily ACEi/ARB: yes Statin: yes Patient endorses taking these medications as prescribed.  Most recent A1Cs:  Lab Results  Component Value Date   HGBA1C 7.4 (A) 03/04/2023   HGBA1C 7.1 (A) 07/10/2022   HGBA1C 7.3 (A) 11/19/2021   Last Microalbumin, LDL, Creatinine: Lab Results  Component Value Date   MICROALBUR 0.62 08/15/2014   LDLCALC 62 11/12/2020   CREATININE 0.65 09/28/2022    Hypertension: Patient is a 57 y.o. female who present today for follow up of hypertension.   Patient endorses has been having issues controlling blood pressure at home.  She self titrated up on her olmesartan to 15 mg daily from 10 mg daily 1 month ago.  She still says her blood pressures at home have been around [id number] to [id number] systolics.  Home medications include: Olmesartan 15 mg daily (1 month ago self increased) Patient endorses taking these medications as prescribed.  Most recent creatinine trend:  Lab Results  Component Value Date   CREATININE 0.65 09/28/2022   CREATININE 0.68 11/19/2021   CREATININE 0.78 11/12/2020   Patient does check blood pressure at home.   Onychomycosis Patient would like to be referred to podiatry for toenail management.  We would like to hold off on systemic medications at  this point for fungal infection.  Tobacco Use Patient interested in trying chantix for tobacco cessation  PERTINENT  PMH / PSH:   OBJECTIVE:   BP 130/89   Pulse (!) 105   Ht 5\' 9"  (1.753 m)   Wt 242 lb (109.8 kg)   LMP 06/23/2013   SpO2 98%   BMI 35.74 kg/m   General: NAD, awake, alert, responsive to questions Head: Normocephalic atraumatic CV: Regular rate and rhythm no murmurs rubs or gallops Respiratory: Clear to ausculation bilaterally, no wheezes rales or crackles, chest rises symmetrically,  no increased work of breathing Abdomen: Soft, non-tender, non-distended, normoactive bowel sounds  Extremities: Moves upper and lower extremities freely, no edema in LE Right Foot: Inspection:  No obvious bony deformity b/l. Onychomycosis, thickened toenails. No swelling, erythema, or bruising b/l.  No ulcerations seen.  Flattened transverse arch. Palpation: Tenderness to palpation around plantar surface of base of third metatarsal ROM: Full  ROM of the ankle b/l. Neurovascular: N/V intact distally in the lower extremity b/l  ASSESSMENT/PLAN:   Right foot pain Right foot has running for the last 2 to 3 weeks.  No trauma to the area.  She is not wearing very supportive shoes currently.  She is interested in sports medicine referral for potential orthotics/evaluation.  Pain is improved and controlled today. -Sports medicine referral placed  Essential hypertension On recheck was controlled but is not controlled at home on current regimen. -Increase olmesartan to 20 mg daily -Recheck BP and BMP in  2 weeks  Diabetes mellitus, type 2 A1c overall controlled at 7.4.  She has been taking Lantus intermittently. -Discussed taking Lantus 10 units daily -Metformin 1000 mg twice daily -Change semaglutide to [facility]  TOBACCO ABUSE -Chantix med placed  Onychomycosis Want to avoid systemic antifungals for now. -Podiatry referral placed    Gerrit Heck, MD [place]

## 2023-03-04 NOTE — Assessment & Plan Note (Signed)
On recheck was controlled but is not controlled at home on current regimen. -Increase olmesartan to 20 mg daily -Recheck BP and BMP in 2 weeks

## 2023-03-04 NOTE — Patient Instructions (Addendum)
It was great to see you! Thank you for allowing me to participate in your care!   Our plans for today:  - Continue metformin 1000 mg twice a day, do the lantus 10 Units EVERY DAY, I am sending in mounjaro to try for diabetes - I am placing a sports medicine referral for your left foot pain - I am increasing your olmesartan to 20 mg daily - f/u in 2 weeks for recheck of your BP and lab - podiatry referral for your toenails  Take care and seek immediate care sooner if you develop any concerns.  Gerrit Heck, MD

## 2023-03-04 NOTE — Assessment & Plan Note (Addendum)
Want to avoid systemic antifungals for now. -Podiatry referral placed

## 2023-03-04 NOTE — Assessment & Plan Note (Signed)
-  Chantix med placed

## 2023-03-05 ENCOUNTER — Encounter: Payer: Self-pay | Admitting: Student

## 2023-03-05 LAB — MICROALBUMIN / CREATININE URINE RATIO
Creatinine, Urine: 195.9 mg/dL
Microalb/Creat Ratio: 16 mg/g creat (ref 0–29)
Microalbumin, Urine: 30.5 ug/mL

## 2023-03-09 ENCOUNTER — Ambulatory Visit: Payer: Medicaid Other | Attending: Neurosurgery | Admitting: Physical Therapy

## 2023-03-09 ENCOUNTER — Other Ambulatory Visit: Payer: Self-pay

## 2023-03-09 DIAGNOSIS — R293 Abnormal posture: Secondary | ICD-10-CM | POA: Diagnosis not present

## 2023-03-09 DIAGNOSIS — M6281 Muscle weakness (generalized): Secondary | ICD-10-CM | POA: Diagnosis not present

## 2023-03-09 DIAGNOSIS — G8929 Other chronic pain: Secondary | ICD-10-CM | POA: Diagnosis not present

## 2023-03-09 DIAGNOSIS — M5441 Lumbago with sciatica, right side: Secondary | ICD-10-CM | POA: Diagnosis not present

## 2023-03-09 NOTE — Therapy (Signed)
OUTPATIENT PHYSICAL THERAPY THORACOLUMBAR EVALUATION   Patient Name: Melanie Phillips MRN: [id number] DOB:09-24-1966, 57 y.o., female Today's Date: 03/09/2023  END OF SESSION:  PT End of Session - 03/09/23 1055     Visit Number 1    Number of Visits 12    Date for PT Re-Evaluation 04/20/23    Authorization Type [employer]    PT Start Time 1026    PT Stop Time 1100    PT Time Calculation (min) 34 min    Activity Tolerance Patient tolerated treatment well    Behavior During Therapy WFL for tasks assessed/performed             Past Medical History:  Diagnosis Date   Arthritis    Back pain    Diabetes mellitus without complication    GERD (gastroesophageal reflux disease)    Hyperlipidemia    Hypertension    Trigger finger of right hand    RMF, RRF   Past Surgical History:  Procedure Laterality Date   CARPAL TUNNEL RELEASE Right 03/10/2017   Procedure: CARPAL TUNNEL RELEASE;  Surgeon: Tarry Kos, MD;  Location: [facility];  Service: Orthopedics;  Laterality: Right;   CARPOMETACARPEL SUSPENSION PLASTY Right 03/10/2017   Procedure: Right thumb Ligament Reconstruction Tendon Interposition, Right Carpal tunnel release;  Surgeon: Tarry Kos, MD;  Location: [facility];  Service: Orthopedics;  Laterality: Right;   JOINT REPLACEMENT     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   Procedure: RIGHT TOTAL KNEE ARTHROPLASTY;  Surgeon: Tarry Kos, MD;  Location: [facility];  Service: Orthopedics;  Laterality: Right;   TRIGGER FINGER RELEASE Left 05/26/2017   Procedure: RELEASE TRIGGER FINGER LEFT THUMB;  Surgeon: Tarry Kos, MD;  Location: [facility];  Service: Orthopedics;  Laterality: Left;   TRIGGER FINGER RELEASE Right 02/23/2018   Procedure: RELEASE RIGHT TRIGGER THUMB;  Surgeon: Tarry Kos, MD;  Location: [facility];  Service: Orthopedics;  Laterality: Right;  Bier block   TRIGGER FINGER RELEASE Right 05/06/2022    Procedure: RIGHT MIDDLE & RING TRIGGER FINGER RELEASE;  Surgeon: Tarry Kos, MD;  Location:  SURGERY CENTER;  Service: Orthopedics;  Laterality: Right;   TUBAL LIGATION     Patient Active Problem List   Diagnosis Date Noted   Onychomycosis 08/28/2022   Left ankle swelling 07/10/2022   Trigger finger, right ring finger 05/06/2022   Trigger finger, right middle finger 04/15/2022   Hoarse voice quality 12/24/2021   Compression fracture of L1 lumbar vertebra 11/14/2020   Arthritis of hand, right 06/12/2020   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   Right foot pain 08/16/2017   Tenosynovitis of finger 05/26/2017   Arthritis of carpometacarpal [facility]) joint of right thumb 03/10/2017   Essential hypertension 03/04/2017   Depression 03/09/2016   Carpal tunnel syndrome 03/09/2016   Diabetes mellitus, type 2 08/15/2014   Sciatica 05/31/2012   OBESITY, UNSPECIFIED 03/07/2010   TOBACCO ABUSE 03/07/2010    PCP: Levin Erp, MD   REFERRING PROVIDER: Julio Sicks, MD   REFERRING DIAG: S22.080A (ICD-10-CM) - Wedge compression fracture of T11-T12 vertebra, initial encounter for closed fracture   Rationale for Evaluation and Treatment: Rehabilitation  THERAPY DIAG:  Chronic low back pain with right-sided sciatica, unspecified back pain laterality - Plan: PT plan of care cert/re-cert  Muscle weakness (generalized) - Plan: PT plan of care cert/re-cert  Abnormal posture - Plan: PT plan of care cert/re-cert  ONSET DATE: 09-29-23 fall breaking dog fight apart  SUBJECTIVE:                                                                                                                                                                                           SUBJECTIVE STATEMENT: I broke my back in 09-29-23  when I fell trying to keep my pit bulls from fighting nad I fractured my T-12 L-1 and L3.  I have a brace I left at home.  I am now weaned from the brace to only heavy  work around the house.  I need to get strong in my back now.  When I go to the grocery store, I have pain and I have to lean on the grocery cart. I can sit for 20 minutes.  I can cook and stand for about 10 min. I have a high chair I use in the kitchen.  Pt does not have any routine exercise program. I bend forward and I am top heavy   It is hard for me to straighten up.  PERTINENT HISTORY:  OA, DM, GERD, hyperlipidemia, HTN,  post trigger finger RT post surgery, RT TKA and bil surgeries for trigger fingers RT hand, and bil carpal tunnel, RT side sciatica, obesisty, depression, tobacco abuse, neuropathy of LT peroneal nerve, onychomycosis  PAIN:  Are you having pain? Yes: NPRS scale: 6/10 at rest and at worst my pain is 11/10 Pain location: low back and mid back Pain description: aching/sharp mostly spasm Aggravating factors: standing up for longer than 10 min  I wake up every 4 hours, grocery shopping, washing dishes and laundry Relieving factors: being still and amedication  PRECAUTIONS: Other: [place] [employer]   Osteopenia, 09-28-22 T-12 to L-2 compression fx  WEIGHT BEARING RESTRICTIONS: No  FALLS:  Has patient fallen in last 6 months? Yes. Number of falls 1  I was trying to pull my pit bulls apart when  they were fighting and I pulled one by the collar and fell back on my back.  LIVING ENVIRONMENT: Lives with: lives alone Lives in: House/apartment Stairs: Yes: External: 4 steps; none Has following equipment at home: Single point cane  OCCUPATION: disability for RT TKA  PLOF: Independent  on disability  PATIENT GOALS: I would like to walk straight without bending over, do household chores with less pain  NEXT MD VISIT: TBD  OBJECTIVE:   DIAGNOSTIC FINDINGS:  FINDINGS: Segmentation: Normal, concordant with the thoracic numbering today.   Alignment: Relatively normal lumbar lordosis. No significant spondylolisthesis or scoliosis.   Vertebrae: Osteopenia. L1 superior  endplate compression fracture with up to 45%  loss of vertebral body height anteriorly on the right. No retropulsion. L1 pedicles and posterior elements appear intact and aligned.   L2 is intact.   Mild L3 superior endplate compression fracture. 30% loss of height. No retropulsion. L3 pedicles and posterior elements appear intact and aligned.   L4 and L5 appear intact.  Intact visible sacrum and SI joints.   Paraspinal and other soft tissues: Abdominal and pelvic viscera are detailed separately. Lumbar paraspinal soft tissues remain within normal limits.   Disc levels: Lumbar spine degeneration appears largely age-appropriate, with no CT evidence of lumbar spinal stenosis.   IMPRESSION: 1. Osteopenia with age indeterminate L1 > L3 compression fractures. No retropulsion or complicating features. If specific therapy such as vertebroplasty is desired, Lumbar MRI or Nuclear Medicine Whole-body Bone Scan would best determine acuity. 2. Age-appropriate lumbar spine degeneration. CT Abdomen and Pelvis today reported separately.  INDINGS: Limited cervical spine imaging: Cervicothoracic junction alignment is within normal limits.   Thoracic spine segmentation:  Normal.   Alignment: Relatively normal thoracic kyphosis. No spondylolisthesis or scoliosis.   Vertebrae: Generalized osteopenia. Thoracic vertebrae through T11 appear intact.   T12 superior endplate compression appears age indeterminate. No retropulsion. T12 pedicles and posterior elements appear intact. 30% central loss of vertebral body height.   Visible posterior ribs are intact.   Paraspinal and other soft tissues: Chest and abdominal viscera are reported separately. Thoracic paraspinal soft tissues are within normal limits.   Disc levels:   Widespread thoracic disc and endplate degeneration. Mostly anterior degenerative endplate spurring. No CT evidence of thoracic spinal stenosis.   IMPRESSION: 1. Age  indeterminate T12 superior endplate compression fracture with 30% loss of vertebral body height. No retropulsion or complicating features. 2. No other acute osseous abnormality identified in the thoracic spine.  PATIENT SURVEYS:  Modified Oswestry 70%   SCREENING FOR RED FLAGS: Bowel or bladder incontinence: No  Compression fracture: Yes: 09-29-23  Wedge   COGNITION: Overall cognitive status: Within functional limits for tasks assessed     SENSATION: WFL  MUSCLE LENGTH: Hamstrings: Right 70 deg; Left 65 deg   POSTURE: flexed trunk   PALPATION: PAIVM   pt reports feeling better  LUMBAR ROM:   AROM eval  Flexion Fingertip to midshin  Extension -20 deg from extension neutral  Right lateral flexion WFL  Left lateral flexion WFL  Right rotation  50%  Left rotation 50%   (Blank rows = not tested)  LOWER EXTREMITY ROM:    WNL for  LE's  Active  Right eval Left eval  Hip flexion    Hip extension    Hip abduction    Hip adduction    Hip internal rotation    Hip external rotation    Knee flexion    Knee extension    Ankle dorsiflexion    Ankle plantarflexion    Ankle inversion    Ankle eversion     (Blank rows = not tested)  LOWER EXTREMITY MMT:    MMT Right eval Left eval  Hip flexion 4+/ 4  Hip extension 3- 3-  Hip abduction 3- 3-  Hip adduction    Hip internal rotation    Hip external rotation    Knee flexion    Knee extension 4 4  Ankle dorsiflexion    Ankle plantarflexion    Ankle inversion    Ankle eversion     (Blank rows = not tested)    FUNCTIONAL TESTS:  5 times sit to stand: 30.0  sec 6 minute walk test: TBD  GAIT: Distance walked: 150 Assistive device utilized: Single point cane Level of assistance: Modified independence Comments: Pt with widened gait,  obese  TODAY'S TREATMENT:                                                                                                                              DATE: EVAL  issue HEP     PATIENT EDUCATION:  Education details: POC Explanation of findings, Issue HEP Person educated: Patient Education method: Explanation, Demonstration, Tactile cues, Verbal cues, and Handouts Education comprehension: verbalized understanding, returned demonstration, verbal cues required, tactile cues required, and needs further education  HOME EXERCISE PROGRAM: Access Code: [email or web address] URL: [email or web address] Date: 03/09/2023 Prepared by: Garen Lah  Exercises - Supine Pelvic Tilt  - 1 x daily - 7 x weekly - 3 sets - 10 reps - Supine Single Knee to Chest Stretch  - 2 x daily - 7 x weekly - 1 sets - 5 reps - 10 hold - Supine Lower Trunk Rotation  - 2 x daily - 7 x weekly - 1 sets - 5 reps - 20 hold - Sit to stand with sink support Movement snack  - 1 x daily - 7 x weekly - 3 sets - 10 reps  ASSESSMENT:  CLINICAL IMPRESSION: Patient is a 57 y.o. female who was seen today for physical therapy evaluation and treatment for wedge compression fx T-11 to L-12 closed fx  Pt with osteopenia and age related changes.   Pt enters clinic with 6/10 pain and states she  has 11/10 pain at worst.  Pt has increased pain while carrying groceries and doing household chores.Limited standing for 10 minutes  Pt will benefit from skilled PT to address impairments.   OBJECTIVE IMPAIRMENTS: decreased mobility, decreased ROM, decreased strength, hypomobility, improper body mechanics, postural dysfunction, obesity, and pain.   ACTIVITY LIMITATIONS: carrying, lifting, bending, sitting, standing, squatting, stairs, bathing, and locomotion level  PARTICIPATION LIMITATIONS: meal prep, cleaning, laundry, driving, and shopping  PERSONAL FACTORS: OA, DM, GERD, hyperlipidemia, HTN,  post trigger finger RT post surgery, RT TKA and bil surgeries for trigger fingers RT hand, and bil carpal tunnel, RT side sciatica, obesisty, depression, tobacco abuse, neuropathy of LT peroneal nerve, onychomycosis are  also affecting patient's functional outcome.   REHAB POTENTIAL: Good  CLINICAL DECISION MAKING: Evolving/moderate complexity  EVALUATION COMPLEXITY: Moderate   GOALS: Goals reviewed with patient? Yes  SHORT TERM GOALS: Target date: 03-30-23  Independent with initial HEP Baseline: no knowledge Goal status: INITIAL  2.  Report pain at rest decrease from 6/10 to 4/10. Baseline: 6/10 rest  11/10 at worst Goal status: INITIAL  3.  Demonstrate understanding of neutral posture and be more conscious of position and posture throughout the day.  Baseline: Pt with flexed trunk posture Goal status: INITIAL    LONG TERM GOALS: Target date: 04-23-23  Pt will be independent with advanced HEP.  Baseline: no  knowledge Goal status: INITIAL  2.  Demonstrate and verbalize techniques to reduce the risk of re-injury including: lifting, posture, body mechanics.  Baseline: no knowledge Goal status: INITIAL  3.  Pt will be able to demonstrate lifting 15# from the floor, not limited by pain, to allow completion of housework   Baseline: Pt with 10+  pain with bending over Goal status: INITIAL  4.  Pt will walk at least 3 days a week for 20 min minimum Baseline: no exercise routine Goal status: INITIAL  5.  Pt with ODI improved from 70% to at least 50 % to show increased functional mobilty Baseline: Eval 70% Goal status: INITIAL  6.  Pt will stand for longer than 30 min in order to complete and carry groceries Baseline: difficulty standing for 10 min Goal status: INITIAL  PLAN:  PT FREQUENCY: 1-2x/week  PT DURATION: 6 weeks  PLANNED INTERVENTIONS: Therapeutic exercises, Therapeutic activity, Neuromuscular re-education, Balance training, Gait training, Patient/Family education, Self Care, Joint mobilization, Stair training, Aquatic Therapy, Dry Needling, Electrical stimulation, Spinal mobilization, Cryotherapy, Moist heat, Taping, Manual therapy, and Re-evaluation.  PLAN FOR NEXT  SESSION: REview HEP  Check all possible CPT codes: [id number] - PT Re-evaluation, 97110- Therapeutic Exercise, [phone]- Neuro Re-education, [phone] - Gait Training, [phone] - Manual Therapy, [phone] - Therapeutic Activities, [phone] - Self Care, [phone] - Electrical stimulation (unattended), and [phone] - Aquatic therapy    Check all conditions that are expected to impact treatment: Morbid obesity and Musculoskeletal disorders   If treatment provided at initial evaluation, no treatment charged due to lack of authorization.        Garen LahLawrie Kebin Maye, PT, ATRIC Certified Exercise Expert for the Aging Adult  03/09/23 5:06 PM Phone: [phone] Fax: [phone]

## 2023-03-10 ENCOUNTER — Ambulatory Visit: Payer: Medicaid Other | Admitting: Sports Medicine

## 2023-03-11 ENCOUNTER — Telehealth: Payer: Self-pay

## 2023-03-11 NOTE — Telephone Encounter (Signed)
Rec'd fax from patients pharmacy regarding PA for [facility].   Medicaid prefers: Bydureon, Byetta, Trulicity, Victoza, or Ozempic

## 2023-03-12 NOTE — Progress Notes (Unsigned)
    SUBJECTIVE:   CHIEF COMPLAINT / HPI: BP Follow UP  HTN Olmesartan increased to 20 mg at last visit.  Home BP 140s-160s systolics Has been adherent to her medications  Diabetes Mounjaro not covered-will send in preferred medicine of Trulicity patient and she is amenable to this.  Tobacco use Has started Chantix and feels like her cravings have gone down a lot.  Tolerating this well.  PERTINENT  PMH / PSH:   OBJECTIVE:   BP (!) 160/82   Pulse 92   Temp 98.2 F (36.8 C)   Ht 5\' 9"  (1.753 m)   Wt 245 lb 9.6 oz (111.4 kg)   LMP 06/23/2013   SpO2 94%   BMI 36.27 kg/m   General: Well appearing, NAD, awake, alert, responsive to questions Head: Normocephalic atraumatic CV: Regular rate and rhythm no murmurs rubs or gallops Respiratory: Clear to ausculation bilaterally, no wheezes rales or crackles, chest rises symmetrically,  no increased work of breathing Extremities: trace pitting edema in b/l LE  ASSESSMENT/PLAN:   Essential hypertension Continues to be uncontrolled today at 160/82 on repeat. -Continue olmesartan 20 mg daily -Start amlodipine 5 mg daily -BMP -Nurse visit BP recheck in 2 weeks -We have room to go up on olmesartan and amlodipine if still uncontrolled but would like to check her electrolytes/renal function before increasing olmesartan  Diabetes mellitus, type 2 Mounjaro was not covered by her insurance. -Trulicity 0.75 mg weekly sent into pharmacy  TOBACCO ABUSE Doing well on Chantix -Continue to monitor    Levin Erp, MD [facility]

## 2023-03-15 ENCOUNTER — Ambulatory Visit (INDEPENDENT_AMBULATORY_CARE_PROVIDER_SITE_OTHER): Payer: Medicaid Other | Admitting: Student

## 2023-03-15 ENCOUNTER — Encounter: Payer: Self-pay | Admitting: Student

## 2023-03-15 VITALS — BP 160/82 | HR 92 | Temp 98.2°F | Ht 69.0 in | Wt 245.6 lb

## 2023-03-15 DIAGNOSIS — F17200 Nicotine dependence, unspecified, uncomplicated: Secondary | ICD-10-CM

## 2023-03-15 DIAGNOSIS — E119 Type 2 diabetes mellitus without complications: Secondary | ICD-10-CM | POA: Diagnosis not present

## 2023-03-15 DIAGNOSIS — Z794 Long term (current) use of insulin: Secondary | ICD-10-CM | POA: Diagnosis not present

## 2023-03-15 DIAGNOSIS — I10 Essential (primary) hypertension: Secondary | ICD-10-CM | POA: Diagnosis not present

## 2023-03-15 MED ORDER — AMLODIPINE BESYLATE 5 MG PO TABS
5.0000 mg | ORAL_TABLET | Freq: Every day | ORAL | 0 refills | Status: DC
Start: 2023-03-15 — End: 2023-04-07

## 2023-03-15 MED ORDER — TRULICITY 0.75 MG/0.5ML ~~LOC~~ SOAJ
0.7500 mg | SUBCUTANEOUS | 0 refills | Status: DC
Start: 2023-03-15 — End: 2023-04-07

## 2023-03-15 NOTE — Assessment & Plan Note (Signed)
Melanie Phillips was not covered by her insurance. -Trulicity 0.75 mg weekly sent into pharmacy

## 2023-03-15 NOTE — Assessment & Plan Note (Signed)
Continues to be uncontrolled today at 160/82 on repeat. -Continue olmesartan 20 mg daily -Start amlodipine 5 mg daily -BMP -Nurse visit BP recheck in 2 weeks -We have room to go up on olmesartan and amlodipine if still uncontrolled but would like to check her electrolytes/renal function before increasing olmesartan

## 2023-03-15 NOTE — Assessment & Plan Note (Signed)
Doing well on Chantix -Continue to monitor

## 2023-03-15 NOTE — Patient Instructions (Addendum)
It was great to see you! Thank you for allowing me to participate in your care!   Our plans for today:  -Will check your labs today and make sure this is okay -I am going to start you on amlodipine 5 mg daily-please stop at the front desk to schedule a nurse visit for blood pressure recheck in 2 weeks to make sure that this is coming down appropriately. Continue olmesartan 20 mg daily - I have sent in trulicity to take weekly  Take care and seek immediate care sooner if you develop any concerns.  Levin Erp, MD

## 2023-03-16 ENCOUNTER — Encounter: Payer: Self-pay | Admitting: Student

## 2023-03-16 LAB — BASIC METABOLIC PANEL
BUN/Creatinine Ratio: 15 (ref 9–23)
BUN: 10 mg/dL (ref 6–24)
CO2: 19 mmol/L — ABNORMAL LOW (ref 20–29)
Calcium: 9.4 mg/dL (ref 8.7–10.2)
Chloride: 102 mmol/L (ref 96–106)
Creatinine, Ser: 0.68 mg/dL (ref 0.57–1.00)
Glucose: 140 mg/dL — ABNORMAL HIGH (ref 70–99)
Potassium: 4.1 mmol/L (ref 3.5–5.2)
Sodium: 140 mmol/L (ref 134–144)
eGFR: 101 mL/min/{1.73_m2} (ref >59)

## 2023-03-18 ENCOUNTER — Ambulatory Visit: Payer: Medicaid Other | Admitting: Podiatry

## 2023-03-23 ENCOUNTER — Ambulatory Visit: Payer: Medicaid Other | Admitting: Physical Therapy

## 2023-03-25 ENCOUNTER — Encounter: Payer: Self-pay | Admitting: Physical Therapy

## 2023-03-25 ENCOUNTER — Ambulatory Visit: Payer: Medicaid Other | Admitting: Physical Therapy

## 2023-03-25 DIAGNOSIS — M5441 Lumbago with sciatica, right side: Secondary | ICD-10-CM | POA: Diagnosis not present

## 2023-03-25 DIAGNOSIS — G8929 Other chronic pain: Secondary | ICD-10-CM

## 2023-03-25 DIAGNOSIS — M6281 Muscle weakness (generalized): Secondary | ICD-10-CM

## 2023-03-25 DIAGNOSIS — R293 Abnormal posture: Secondary | ICD-10-CM

## 2023-03-25 NOTE — Therapy (Addendum)
OUTPATIENT PHYSICAL THERAPY TREATMENT NOTE + NO VISIT DISCHARGE (see below)    Patient Name: Melanie Phillips MRN: [id number] DOB:1966-10-11, 57 y.o., female Today's Date: 03/25/2023  PCP: Levin Erp, MD    REFERRING PROVIDER: Julio Sicks, MD   END OF SESSION:   PT End of Session - 03/25/23 1156     Visit Number 2    Number of Visits 12    Date for PT Re-Evaluation 04/20/23    Authorization Type [employer]    Authorization Time Period 9 visits 04/22/824-05/20/23    Authorization - Visit Number 1    Authorization - Number of Visits 9    PT Start Time 1155    PT Stop Time 1230    PT Time Calculation (min) 35 min    Activity Tolerance Patient tolerated treatment well;No increased pain    Behavior During Therapy WFL for tasks assessed/performed             Past Medical History:  Diagnosis Date   Arthritis    Back pain    Diabetes mellitus without complication    GERD (gastroesophageal reflux disease)    Hyperlipidemia    Hypertension    Trigger finger of right hand    RMF, RRF   Past Surgical History:  Procedure Laterality Date   CARPAL TUNNEL RELEASE Right 03/10/2017   Procedure: CARPAL TUNNEL RELEASE;  Surgeon: Tarry Kos, MD;  Location: [facility];  Service: Orthopedics;  Laterality: Right;   CARPOMETACARPEL SUSPENSION PLASTY Right 03/10/2017   Procedure: Right thumb Ligament Reconstruction Tendon Interposition, Right Carpal tunnel release;  Surgeon: Tarry Kos, MD;  Location: [facility];  Service: Orthopedics;  Laterality: Right;   JOINT REPLACEMENT     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   Procedure: RIGHT TOTAL KNEE ARTHROPLASTY;  Surgeon: Tarry Kos, MD;  Location: [facility];  Service: Orthopedics;  Laterality: Right;   TRIGGER FINGER RELEASE Left 05/26/2017   Procedure: RELEASE TRIGGER FINGER LEFT THUMB;  Surgeon: Tarry Kos, MD;  Location: [facility];  Service: Orthopedics;  Laterality: Left;    TRIGGER FINGER RELEASE Right 02/23/2018   Procedure: RELEASE RIGHT TRIGGER THUMB;  Surgeon: Tarry Kos, MD;  Location: [facility];  Service: Orthopedics;  Laterality: Right;  Bier block   TRIGGER FINGER RELEASE Right 05/06/2022   Procedure: RIGHT MIDDLE & RING TRIGGER FINGER RELEASE;  Surgeon: Tarry Kos, MD;  Location: [facility];  Service: Orthopedics;  Laterality: Right;   TUBAL LIGATION     Patient Active Problem List   Diagnosis Date Noted   Onychomycosis 08/28/2022   Left ankle swelling 07/10/2022   Trigger finger, right ring finger 05/06/2022   Trigger finger, right middle finger 04/15/2022   Hoarse voice quality 12/24/2021   Compression fracture of L1 lumbar vertebra 11/14/2020   Arthritis of hand, right 06/12/2020   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   Right foot pain 08/16/2017   Tenosynovitis of finger 05/26/2017   Arthritis of carpometacarpal [facility]) joint of right thumb 03/10/2017   Essential hypertension 03/04/2017   Depression 03/09/2016   Carpal tunnel syndrome 03/09/2016   Diabetes mellitus, type 2 08/15/2014   Sciatica 05/31/2012   OBESITY, UNSPECIFIED 03/07/2010   TOBACCO ABUSE 03/07/2010    REFERRING DIAG: S22.080A (ICD-10-CM) - Wedge compression fracture of T11-T12 vertebra, initial encounter for closed fracture   THERAPY DIAG:  Chronic low back pain with right-sided sciatica, unspecified back pain  laterality  Muscle weakness (generalized)  Abnormal posture  Rationale for Evaluation and Treatment Rehabilitation  PERTINENT HISTORY: OA, DM, GERD, hyperlipidemia, HTN,  post trigger finger RT post surgery, RT TKA and bil surgeries for trigger fingers RT hand, and bil carpal tunnel, RT side sciatica, obesity, depression, tobacco abuse, neuropathy of LT peroneal nerve, onychomycosis   PRECAUTIONS: osteopenia, thoracic/lumbar compression fractures, falls  SUBJECTIVE:                                                                                                                                                                                      Per eval - I broke my back in 09-29-23  when I fell trying to keep my pit bulls from fighting nad I fractured my T-12 L-1 and L3.  I have a brace I left at home.  I am now weaned from the brace to only heavy work around the house.  I need to get strong in my back now.  When I go to the grocery store, I have pain and I have to lean on the grocery cart. I can sit for 20 minutes.  I can cook and stand for about 10 min. I have a high chair I use in the kitchen.  Pt does not have any routine exercise program. I bend forward and I am top heavy   It is hard for me to straighten up.   SUBJECTIVE STATEMENT:   03/25/2023 Pt arrives w/o pain. States exercises seem to be helping, particularly LTR. No issues since initial eval reported    PAIN:  03/25/2023 0/10 pain at present  Per eval -  Are you having pain? Yes: NPRS scale: 6/10 at rest and at worst my pain is 11/10 Pain location: low back and mid back Pain description: aching/sharp mostly spasm Aggravating factors: standing up for longer than 10 min  I wake up every 4 hours, grocery shopping, washing dishes and laundry Relieving factors: being still and amedication   OBJECTIVE: (objective measures completed at initial evaluation unless otherwise dated)   DIAGNOSTIC FINDINGS:  FINDINGS: Segmentation: Normal, concordant with the thoracic numbering today.   Alignment: Relatively normal lumbar lordosis. No significant spondylolisthesis or scoliosis.   Vertebrae: Osteopenia. L1 superior endplate compression fracture with up to 45% loss of vertebral body height anteriorly on the right. No retropulsion. L1 pedicles and posterior elements appear intact and aligned.   L2 is intact.   Mild L3 superior endplate compression fracture. 30% loss of height. No retropulsion. L3 pedicles and posterior elements appear intact and  aligned.   L4 and L5 appear intact.  Intact visible sacrum and SI joints.   Paraspinal and other  soft tissues: Abdominal and pelvic viscera are detailed separately. Lumbar paraspinal soft tissues remain within normal limits.   Disc levels: Lumbar spine degeneration appears largely age-appropriate, with no CT evidence of lumbar spinal stenosis.   IMPRESSION: 1. Osteopenia with age indeterminate L1 > L3 compression fractures. No retropulsion or complicating features. If specific therapy such as vertebroplasty is desired, Lumbar MRI or Nuclear Medicine Whole-body Bone Scan would best determine acuity. 2. Age-appropriate lumbar spine degeneration. CT Abdomen and Pelvis today reported separately.   INDINGS: Limited cervical spine imaging: Cervicothoracic junction alignment is within normal limits.   Thoracic spine segmentation:  Normal.   Alignment: Relatively normal thoracic kyphosis. No spondylolisthesis or scoliosis.   Vertebrae: Generalized osteopenia. Thoracic vertebrae through T11 appear intact.   T12 superior endplate compression appears age indeterminate. No retropulsion. T12 pedicles and posterior elements appear intact. 30% central loss of vertebral body height.   Visible posterior ribs are intact.   Paraspinal and other soft tissues: Chest and abdominal viscera are reported separately. Thoracic paraspinal soft tissues are within normal limits.   Disc levels:   Widespread thoracic disc and endplate degeneration. Mostly anterior degenerative endplate spurring. No CT evidence of thoracic spinal stenosis.   IMPRESSION: 1. Age indeterminate T12 superior endplate compression fracture with 30% loss of vertebral body height. No retropulsion or complicating features. 2. No other acute osseous abnormality identified in the thoracic spine.   PATIENT SURVEYS:  Modified Oswestry 70%    SCREENING FOR RED FLAGS: Bowel or bladder incontinence: No   Compression  fracture: Yes: 09-29-23  Wedge     COGNITION: Overall cognitive status: Within functional limits for tasks assessed                          SENSATION: WFL   MUSCLE LENGTH: Hamstrings: Right 70 deg; Left 65 deg     POSTURE: flexed trunk    PALPATION: PAIVM   pt reports feeling better   LUMBAR ROM:    AROM eval  Flexion Fingertip to midshin  Extension -20 deg from extension neutral  Right lateral flexion WFL  Left lateral flexion WFL  Right rotation  50%  Left rotation 50%   (Blank rows = not tested)   LOWER EXTREMITY ROM:    WNL for  LE's   Active  Right eval Left eval  Hip flexion      Hip extension      Hip abduction      Hip adduction      Hip internal rotation      Hip external rotation      Knee flexion      Knee extension      Ankle dorsiflexion      Ankle plantarflexion      Ankle inversion      Ankle eversion       (Blank rows = not tested)   LOWER EXTREMITY MMT:     MMT Right eval Left eval  Hip flexion 4+/ 4  Hip extension 3- 3-  Hip abduction 3- 3-  Hip adduction      Hip internal rotation      Hip external rotation      Knee flexion      Knee extension 4 4  Ankle dorsiflexion      Ankle plantarflexion      Ankle inversion      Ankle eversion       (Blank rows = not tested)  FUNCTIONAL TESTS:  5 times sit to stand: 30.0 sec 6 minute walk test: TBD   GAIT: Distance walked: 150 Assistive device utilized: Single point cane Level of assistance: Modified independence Comments: Pt with widened gait,  obese   TODAY'S TREATMENT:                                                                                                                              [facility] Adult PT Treatment:                                                DATE: 03/25/23 Therapeutic Exercise: Supine PPT 2x12 cues for reduced compensations at thoracolumbar spine  Supine LTR x5 Bil cues for comfortable ROM and breath control  SKTC x5 BIL 30sec hold cues for  breath control Adductor iso 2x12 cues for breath control and posture Seated RTB hip abduction 2x12 cues for posture  Seated march 2x8 BIL cues for posture  Standing heel raises x12 emphasis on postural extension/neutral HEP update + handout/education      PATIENT EDUCATION:  Education details: rationale for interventions, HEP review, relevant anatomy/physiology  Person educated: Patient Education method: Explanation, Demonstration, Tactile cues, Verbal cues, and Handouts Education comprehension: verbalized understanding, returned demonstration, verbal cues required, tactile cues required, and needs further education   HOME EXERCISE PROGRAM: Access Code: [email or web address] URL: [email or web address] Date: 03/25/2023 Prepared by: Fransisco Hertz  Exercises - Supine Pelvic Tilt  - 1 x daily - 7 x weekly - 3 sets - 10 reps - Supine Single Knee to Chest Stretch  - 2 x daily - 7 x weekly - 1 sets - 5 reps - 10 hold - Supine Lower Trunk Rotation  - 2 x daily - 7 x weekly - 1 sets - 5 reps - 20 hold - Sit to stand with sink support Movement snack  - 1 x daily - 7 x weekly - 3 sets - 10 reps - Heel Raises with Counter Support  - 1 x daily - 7 x weekly - 2 sets - 12 reps   ASSESSMENT:   CLINICAL IMPRESSION: Pt arrives w/o pain today, states exercises from initial evaluation have been helpful. Today focusing on HEP review which pt performs well with within clinic, also progressed for gentle lumbopelvic stability training. No adverse events, pt denies any pain during today's activities and departs without pain. Recommend continuing along current POC in order to address relevant deficits and improve functional tolerance. Pt departs today's session in no acute distress, all voiced questions/concerns addressed appropriately from PT perspective.    Per eval - Patient is a 57 y.o. female who was seen today for physical therapy evaluation and treatment for wedge compression fx T-11 to L-12 closed fx   Pt with osteopenia and age related changes.   Pt enters clinic  with 6/10 pain and states she  has 11/10 pain at worst.  Pt has increased pain while carrying groceries and doing household chores.Limited standing for 10 minutes  Pt will benefit from skilled PT to address impairments.    OBJECTIVE IMPAIRMENTS: decreased mobility, decreased ROM, decreased strength, hypomobility, improper body mechanics, postural dysfunction, obesity, and pain.    ACTIVITY LIMITATIONS: carrying, lifting, bending, sitting, standing, squatting, stairs, bathing, and locomotion level   PARTICIPATION LIMITATIONS: meal prep, cleaning, laundry, driving, and shopping   PERSONAL FACTORS: OA, DM, GERD, hyperlipidemia, HTN,  post trigger finger RT post surgery, RT TKA and bil surgeries for trigger fingers RT hand, and bil carpal tunnel, RT side sciatica, obesisty, depression, tobacco abuse, neuropathy of LT peroneal nerve, onychomycosis are also affecting patient's functional outcome.    REHAB POTENTIAL: Good   CLINICAL DECISION MAKING: Evolving/moderate complexity   EVALUATION COMPLEXITY: Moderate     GOALS: Goals reviewed with patient? Yes   SHORT TERM GOALS: Target date: 03-30-23   Independent with initial HEP Baseline: no knowledge Goal status: INITIAL   2.  Report pain at rest decrease from 6/10 to 4/10. Baseline: 6/10 rest  11/10 at worst Goal status: INITIAL   3.  Demonstrate understanding of neutral posture and be more conscious of position and posture throughout the day.  Baseline: Pt with flexed trunk posture Goal status: INITIAL       LONG TERM GOALS: Target date: 04-23-23   Pt will be independent with advanced HEP.  Baseline: no knowledge Goal status: INITIAL   2.  Demonstrate and verbalize techniques to reduce the risk of re-injury including: lifting, posture, body mechanics.  Baseline: no knowledge Goal status: INITIAL   3.  Pt will be able to demonstrate lifting 15# from the floor, not  limited by pain, to allow completion of housework   Baseline: Pt with 10+  pain with bending over Goal status: INITIAL   4.  Pt will walk at least 3 days a week for 20 min minimum Baseline: no exercise routine Goal status: INITIAL   5.  Pt with ODI improved from 70% to at least 50 % to show increased functional mobilty Baseline: Eval 70% Goal status: INITIAL   6.  Pt will stand for longer than 30 min in order to complete and carry groceries Baseline: difficulty standing for 10 min Goal status: INITIAL   PLAN:   PT FREQUENCY: 1-2x/week   PT DURATION: 6 weeks   PLANNED INTERVENTIONS: Therapeutic exercises, Therapeutic activity, Neuromuscular re-education, Balance training, Gait training, Patient/Family education, Self Care, Joint mobilization, Stair training, Aquatic Therapy, Dry Needling, Electrical stimulation, Spinal mobilization, Cryotherapy, Moist heat, Taping, Manual therapy, and Re-evaluation.   PLAN FOR NEXT SESSION: Review/update HEP PRN. Emphasis on lumbopelvic stability and postural endurance     Ashley Murrain PT, DPT 03/25/2023 12:34 PM     Discharge addendum:    PHYSICAL THERAPY DISCHARGE SUMMARY  Visits from Start of Care: 2  Current functional level related to goals / functional outcomes: Unable to assess   Remaining deficits: Unable to assess   Education / Equipment: Unable to assess  Patient unable to agree to discharge due to lack of follow up.  Patient goals were  unable to be assessed . Patient is being discharged due to not returning since the last visit.   Ashley Murrain PT, DPT 06/15/2023 3:07 PM

## 2023-03-27 ENCOUNTER — Other Ambulatory Visit: Payer: Self-pay | Admitting: Student

## 2023-03-27 DIAGNOSIS — F17200 Nicotine dependence, unspecified, uncomplicated: Secondary | ICD-10-CM

## 2023-03-30 ENCOUNTER — Telehealth: Payer: Self-pay | Admitting: Physical Therapy

## 2023-03-30 ENCOUNTER — Ambulatory Visit: Payer: Medicaid Other

## 2023-03-30 ENCOUNTER — Ambulatory Visit: Payer: Medicaid Other | Admitting: Physical Therapy

## 2023-03-30 NOTE — Telephone Encounter (Signed)
Melanie Phillips called and reminded of appt today at 11:45 today. Pt stated she forgot about appt but will make appt on Thursday at 1;30. Pt reminded of cancellation /attendance policy. Pt stated she understood policy.   Garen Lah, PT, [facility] Certified Exercise Expert for the Aging Adult  03/30/23 12:10 PM Phone: [phone] Fax: [phone]

## 2023-04-01 ENCOUNTER — Ambulatory Visit: Payer: Medicaid Other | Admitting: Physical Therapy

## 2023-04-01 ENCOUNTER — Telehealth: Payer: Self-pay | Admitting: Physical Therapy

## 2023-04-01 NOTE — Telephone Encounter (Signed)
Ms Seidl left message to cancel due to not feeling well today, but CNA Albin Felling McClinton relayed message to  pt about attendance policy.  Ms Maryagnes Amos reports that she is the new CNA assisting Ms Dominy and that she has had unreliable transportation and that she would personally be taking Ms Loos for remaining PT visits.   Ms Wynonia Hazard was also made aware of the attendance policy at [facility] clinic.   Garen Lah, PT, A[facility] Certified Exercise Expert for the Aging Adult  04/01/23 1:07 PM Phone: [phone] Fax: [phone]

## 2023-04-02 ENCOUNTER — Ambulatory Visit (INDEPENDENT_AMBULATORY_CARE_PROVIDER_SITE_OTHER): Payer: Medicaid Other | Admitting: Podiatry

## 2023-04-02 DIAGNOSIS — E119 Type 2 diabetes mellitus without complications: Secondary | ICD-10-CM | POA: Diagnosis not present

## 2023-04-02 DIAGNOSIS — Z794 Long term (current) use of insulin: Secondary | ICD-10-CM | POA: Diagnosis not present

## 2023-04-02 DIAGNOSIS — M2010 Hallux valgus (acquired), unspecified foot: Secondary | ICD-10-CM | POA: Diagnosis not present

## 2023-04-02 DIAGNOSIS — B351 Tinea unguium: Secondary | ICD-10-CM

## 2023-04-02 DIAGNOSIS — M79675 Pain in left toe(s): Secondary | ICD-10-CM

## 2023-04-02 DIAGNOSIS — M79674 Pain in right toe(s): Secondary | ICD-10-CM

## 2023-04-05 ENCOUNTER — Encounter: Payer: Self-pay | Admitting: Podiatry

## 2023-04-05 NOTE — Progress Notes (Signed)
This patient presents  to my office for at risk foot care.  This patient requires this care by a professional since this patient will be at risk due to having diabetes.  This patient is unable to cut nails herself since the patient cannot reach her nails.These nails are painful walking and wearing shoes.  Patient has not been seen in over 7 years. This patient presents for at risk foot care today.  General Appearance  Alert, conversant and in no acute stress.  Vascular  Dorsalis pedis and posterior tibial  pulses are palpable  bilaterally.  Capillary return is within normal limits  bilaterally. Temperature is within normal limits  bilaterally.  Neurologic  Senn-Weinstein monofilament wire test within normal limits  bilaterally. Muscle power within normal limits bilaterally.  Nails Thick disfigured discolored nails with subungual debris  from hallux to fifth toes bilaterally. No evidence of bacterial infection or drainage bilaterally.  Orthopedic  No limitations of motion  feet .  No crepitus or effusions noted.  No bony pathology or digital deformities noted. HAV  B/L>  Skin  normotropic skin with no porokeratosis noted bilaterally.  No signs of infections or ulcers noted.     Onychomycosis  Pain in right toes  Pain in left toes  Consent was obtained for treatment procedures.   Mechanical debridement of nails 1-5  bilaterally performed with a nail nipper.  Filed with dremel without incident.    Return office visit     3 months                 Told patient to return for periodic foot care and evaluation due to potential at risk complications.   Helane Gunther DPM

## 2023-04-06 ENCOUNTER — Ambulatory Visit: Payer: Medicaid Other | Admitting: Physical Therapy

## 2023-04-07 ENCOUNTER — Other Ambulatory Visit: Payer: Self-pay | Admitting: Student

## 2023-04-07 DIAGNOSIS — E119 Type 2 diabetes mellitus without complications: Secondary | ICD-10-CM

## 2023-04-07 DIAGNOSIS — I10 Essential (primary) hypertension: Secondary | ICD-10-CM

## 2023-04-08 ENCOUNTER — Ambulatory Visit: Payer: Medicaid Other | Attending: Neurosurgery | Admitting: Physical Therapy

## 2023-04-08 ENCOUNTER — Telehealth: Payer: Self-pay | Admitting: Physical Therapy

## 2023-04-08 NOTE — Telephone Encounter (Signed)
Called pt re: this afternoon's missed appt, unable to leave voicemail

## 2023-04-13 ENCOUNTER — Telehealth: Payer: Self-pay | Admitting: Physical Therapy

## 2023-04-13 ENCOUNTER — Ambulatory Visit: Payer: Medicaid Other | Admitting: Physical Therapy

## 2023-04-13 NOTE — Telephone Encounter (Signed)
Called pt re: this afternoon's missed appt - no answer, unable to leave voicemail

## 2023-04-15 ENCOUNTER — Other Ambulatory Visit: Payer: Self-pay | Admitting: Student

## 2023-04-15 ENCOUNTER — Ambulatory Visit: Payer: Medicaid Other | Admitting: Physical Therapy

## 2023-04-15 DIAGNOSIS — I10 Essential (primary) hypertension: Secondary | ICD-10-CM

## 2023-04-19 ENCOUNTER — Other Ambulatory Visit: Payer: Self-pay

## 2023-04-19 MED ORDER — METFORMIN HCL 1000 MG PO TABS: 1000.0000 mg | ORAL_TABLET | Freq: Two times a day (BID) | ORAL | 3 refills | Status: DC

## 2023-04-20 ENCOUNTER — Ambulatory Visit: Payer: Medicaid Other | Admitting: Physical Therapy

## 2023-05-31 ENCOUNTER — Other Ambulatory Visit: Payer: Self-pay | Admitting: Student

## 2023-05-31 DIAGNOSIS — I10 Essential (primary) hypertension: Secondary | ICD-10-CM

## 2023-06-23 ENCOUNTER — Other Ambulatory Visit: Payer: Self-pay | Admitting: Student

## 2023-06-23 DIAGNOSIS — I10 Essential (primary) hypertension: Secondary | ICD-10-CM

## 2023-07-09 ENCOUNTER — Other Ambulatory Visit: Payer: Self-pay | Admitting: Student

## 2023-07-09 DIAGNOSIS — I10 Essential (primary) hypertension: Secondary | ICD-10-CM

## 2023-07-20 ENCOUNTER — Other Ambulatory Visit: Payer: Self-pay | Admitting: Student

## 2023-07-20 DIAGNOSIS — Z794 Long term (current) use of insulin: Secondary | ICD-10-CM

## 2023-07-27 ENCOUNTER — Telehealth: Payer: Self-pay

## 2023-07-27 NOTE — Telephone Encounter (Signed)
Patient calls nurse line requesting PCP advisement on Trulicity and Lantus.   She reports her pharmacist told her "there is no need to be taking both."   She reports this has been discussed in detail with PCP. Lantus daily and Trulicity once a week. She reports she has been doing this as prescribed.  Will forward to PCP for advisement.

## 2023-07-30 NOTE — Telephone Encounter (Signed)
Spoke with patient she was informed of the note left by Dr. Laroy Apple. Patient understood. Aquilla Solian, CMA

## 2023-08-05 DIAGNOSIS — M47816 Spondylosis without myelopathy or radiculopathy, lumbar region: Secondary | ICD-10-CM | POA: Diagnosis not present

## 2023-08-05 NOTE — Progress Notes (Deleted)
    SUBJECTIVE:   CHIEF COMPLAINT / HPI:   Diabetic Follow Up: Patient is a 57 y.o. female who present today for diabetic follow up.   Patient endorses {rwdmsmartlistproblems:24882}  Home medications include: metformin 1000 twice daily, Lantus***, Trulicity***, ACEi/ARB: {yes/no/default n/a:21102::"not applicable"} Statin: {yes/no/default n/a:21102::"not applicable"} Patient endorses taking these medications as prescribed.***  Most recent A1Cs:  Lab Results  Component Value Date   HGBA1C 7.4 (A) 03/04/2023   HGBA1C 7.1 (A) 07/10/2022   HGBA1C 7.3 (A) 11/19/2021   Last Microalbumin, LDL, Creatinine: Lab Results  Component Value Date   MICROALBUR 0.62 08/15/2014   LDLCALC 62 11/12/2020   CREATININE 0.68 03/15/2023   Patient {rwdoesdoesnot:24881} check blood glucose on a regular basis.  Patient {rwisisnot:24883} up to date on diabetic eye. Patient {rwisisnot:24883} up to date on diabetic foot exam.  PERTINENT  PMH / PSH: ***  OBJECTIVE:   LMP 06/23/2013   ***  ASSESSMENT/PLAN:   No problem-specific Assessment & Plan notes found for this encounter.     Levin Erp, MD [facility]

## 2023-08-06 ENCOUNTER — Ambulatory Visit: Payer: Medicaid Other | Admitting: Student

## 2023-08-16 NOTE — Progress Notes (Signed)
SUBJECTIVE:   CHIEF COMPLAINT / HPI:   Diabetic Follow Up: Patient is a 57 y.o. female who present today for diabetic follow up.   Patient endorses no problems-was told by her pharmacist that Lantus and Trulicity is not a good combination and that could not make her sick however she has been feeling fine and just recently restarted her Lantus.  I discussed that it is totally fine to take with these medications at the same time.  Home medications include: metformin 1000 twice daily, Lantus 10 units, Trulicity 0.75 mg weekly ACEi/ARB: yes Statin: yes Patient endorses taking these medications as prescribed.  Most recent A1Cs:  Lab Results  Component Value Date   HGBA1C 7.0 08/17/2023   HGBA1C 7.4 (A) 03/04/2023   HGBA1C 7.1 (A) 07/10/2022   Last Microalbumin, LDL, Creatinine: Lab Results  Component Value Date   MICROALBUR 0.62 08/15/2014   LDLCALC 62 11/12/2020   CREATININE 0.68 03/15/2023   Patient does check blood glucose on a regular basis. Sugars 140s-187s, Lowest was 97.  No symptomatic lows.  Hypertension: Patient is a 57 y.o. female who present today for follow up of hypertension.   Patient endorses no problems  Home medications include: olmesartan 20 mg, amlodipine 5 mg Patient endorses taking these medications as prescribed.  Most recent creatinine trend:  Lab Results  Component Value Date   CREATININE 0.68 03/15/2023   CREATININE 0.65 09/28/2022   CREATININE 0.68 11/19/2021   Stomach Pain For past 8-10 days has been having upper abdominal midline pain.  Thought from GERD. OTC prilosec 40 mg daily she has been taking not helping much. No vomiting. No dark colored stools, no fevers. Different than her normal reflux per patient.  States that she has never had an EGD before.  She is a long-term smoker for over 20 years and smokes around 10 cigarettes a day and currently still smokes.  She does have a recent death in her family and is not quite ready to quit  smoking yet.  States that she also has a bulge out of her stomach that she has noticed that she would like to get checked out as well.  Has a history of 6 vaginal deliveries per patient.   PERTINENT  PMH / PSH: reviewed  OBJECTIVE:   BP (!) 143/83   Pulse 91   Ht 5\' 9"  (1.753 m)   Wt 244 lb 6.4 oz (110.9 kg)   LMP 06/23/2013   SpO2 100%   BMI 36.09 kg/m   General: Well appearing, NAD, awake, alert, responsive to questions Head: Normocephalic atraumatic CV: Regular rate and rhythm  Respiratory: Clear to ausculation bilaterally, no wheezes rales or crackles, chest rises symmetrically,  no increased work of breathing Abdomen: Soft, non-tender, non-distended, normoactive bowel sounds, does have bulge in upper abdomen above umbilicus when sitting up, no defect in the abdominal wall that can be palpated Extremities: Moves upper and lower extremities freely ASSESSMENT/PLAN:   Essential hypertension Patient's blood pressure is not controlled today. BP: (!) 143/83. Goal of 130/80. Patient's medication regimen includes amlodipine 5 mg and olmesartan 20 mg. -Changes to current regimen include combined medications into amlodipine 10-olmesartan 20 -Follow up in 2 weeks for BP recheck  Diabetes mellitus, type 2 A1c overall much more controlled than previously at 7. -Continue Lantus 10 units daily -Continue metformin 1000 mg twice daily -Increase Trulicity to 1.5 mg weekly -BMP at next visit  Gastroesophageal reflux disease No reflux symptoms that are not responding to omeprazole  and long-term smoker.  We discussed that she will likely need and EGD for evaluation given her current smoking status and age.  Discussed the need for colonoscopy given her history of polyps on last 1.  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  Gave patient at exercises and things to do at home to help with this. - Ambulatory  referral to Gastroenterology - Provided diastases recti information   Levin Erp, MD [facility]

## 2023-08-17 ENCOUNTER — Ambulatory Visit (INDEPENDENT_AMBULATORY_CARE_PROVIDER_SITE_OTHER): Payer: Medicaid Other | Admitting: Student

## 2023-08-17 ENCOUNTER — Encounter: Payer: Self-pay | Admitting: Student

## 2023-08-17 VITALS — BP 143/83 | HR 91 | Ht 69.0 in | Wt 244.4 lb

## 2023-08-17 DIAGNOSIS — E119 Type 2 diabetes mellitus without complications: Secondary | ICD-10-CM

## 2023-08-17 DIAGNOSIS — I10 Essential (primary) hypertension: Secondary | ICD-10-CM

## 2023-08-17 DIAGNOSIS — K219 Gastro-esophageal reflux disease without esophagitis: Secondary | ICD-10-CM | POA: Diagnosis not present

## 2023-08-17 DIAGNOSIS — Z23 Encounter for immunization: Secondary | ICD-10-CM | POA: Diagnosis not present

## 2023-08-17 DIAGNOSIS — Z794 Long term (current) use of insulin: Secondary | ICD-10-CM | POA: Diagnosis not present

## 2023-08-17 LAB — POCT GLYCOSYLATED HEMOGLOBIN (HGB A1C): HbA1c, POC (controlled diabetic range): 7 % (ref 0.0–7.0)

## 2023-08-17 MED ORDER — AMLODIPINE-OLMESARTAN 10-20 MG PO TABS
1.0000 | ORAL_TABLET | Freq: Every day | ORAL | 0 refills | Status: DC
Start: 2023-08-17 — End: 2023-09-10

## 2023-08-17 MED ORDER — TRULICITY 0.75 MG/0.5ML ~~LOC~~ SOAJ
1.5000 mg | SUBCUTANEOUS | 0 refills | Status: DC
Start: 2023-08-17 — End: 2024-06-12

## 2023-08-17 NOTE — Assessment & Plan Note (Signed)
No reflux symptoms that are not responding to omeprazole and long-term smoker.  We discussed that she will likely need and EGD for evaluation given her current smoking status and age.  Discussed the need for colonoscopy given her history of polyps on last 1.  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  Gave patient at exercises and things to do at home to help with this. - Ambulatory referral to Gastroenterology - Provided diastases recti information

## 2023-08-17 NOTE — Assessment & Plan Note (Signed)
A1c overall much more controlled than previously at 7. -Continue Lantus 10 units daily -Continue metformin 1000 mg twice daily -Increase Trulicity to 1.5 mg weekly -BMP at next visit

## 2023-08-17 NOTE — Assessment & Plan Note (Signed)
Patient's blood pressure is not controlled today. BP: (!) 143/83. Goal of 130/80. Patient's medication regimen includes amlodipine 5 mg and olmesartan 20 mg. -Changes to current regimen include combined medications into amlodipine 10-olmesartan 20 -Follow up in 2 weeks for BP recheck

## 2023-08-17 NOTE — Patient Instructions (Signed)
It was great to see you! Thank you for allowing me to participate in your care!   I recommend that you always bring your medications to each appointment as this makes it easy to ensure we are on the correct medications and helps [place] not miss when refills are needed.  Our plans for today:  -I am sending you to gastroenterology for a possible scope of your esophagus and stomach and you will also need a repeat colonoscopy, you may continue your acid medication for now -It appears that you have diastases recti it does not look like a ventral hernia I will include a handout on what you can do to help with this -I am combining your blood pressure medications into 1 pill called amlodipine-olmesartan or Azor-we are increasing the amlodipine to 10 mg and continuing the olmesartan at 20 mg and this combined pill -I would like for you to follow-up in 2 weeks for a blood pressure recheck -I am increasing your Trulicity to 1.5 mg weekly-your A1c was 7.0 which is doing much better, keep it up  Take care and seek immediate care sooner if you develop any concerns. Please remember to show up 15 minutes before your scheduled appointment time!  Levin Erp, MD [facility]

## 2023-08-19 ENCOUNTER — Encounter: Payer: Self-pay | Admitting: Gastroenterology

## 2023-09-03 ENCOUNTER — Ambulatory Visit: Payer: Medicaid Other | Admitting: Student

## 2023-09-03 NOTE — Progress Notes (Deleted)
    SUBJECTIVE:   CHIEF COMPLAINT / HPI: HTN  Hypertension: Patient is a 57 y.o. female who present today for follow up of hypertension.   Patient endorses {rwdmsmartlistproblems:[id number]}  Home medications include: increased at last visit to amlodipine 10-olmesartan 20 Patient endorses taking these medications as prescribed.*** Denies any headache, vision changes, shortness of breath, lower extremity swelling or chest pain   Most recent creatinine trend:  Lab Results  Component Value Date   CREATININE 0.68 03/15/2023   CREATININE 0.65 09/28/2022   CREATININE 0.68 11/19/2021   Patient {rwdoesdoesnot:24881} check blood pressure at home.  Patient {HAS HAS AOZ:30865} had a BMP in the past 1 year.  PERTINENT  PMH / PSH: ***  OBJECTIVE:   LMP 06/23/2013   *** General: Well appearing, NAD, awake, alert, responsive to questions Head: Normocephalic atraumatic CV: Regular rate and rhythm no murmurs rubs or gallops Respiratory: Clear to ausculation bilaterally, no wheezes rales or crackles, chest rises symmetrically,  no increased work of breathing Abdomen: Soft, non-tender, non-distended, normoactive bowel sounds  Extremities: Moves upper and lower extremities freely, no edema in LE Neuro: No focal deficits Skin: No rashes or lesions visualized  ASSESSMENT/PLAN:   No problem-specific Assessment & Plan notes found for this encounter.     Levin Erp, MD [facility]

## 2023-09-09 DIAGNOSIS — M5126 Other intervertebral disc displacement, lumbar region: Secondary | ICD-10-CM | POA: Diagnosis not present

## 2023-09-09 DIAGNOSIS — M5125 Other intervertebral disc displacement, thoracolumbar region: Secondary | ICD-10-CM | POA: Diagnosis not present

## 2023-09-09 DIAGNOSIS — M47816 Spondylosis without myelopathy or radiculopathy, lumbar region: Secondary | ICD-10-CM | POA: Diagnosis not present

## 2023-09-09 DIAGNOSIS — Z043 Encounter for examination and observation following other accident: Secondary | ICD-10-CM | POA: Diagnosis not present

## 2023-09-10 ENCOUNTER — Other Ambulatory Visit: Payer: Self-pay | Admitting: Student

## 2023-09-10 DIAGNOSIS — I10 Essential (primary) hypertension: Secondary | ICD-10-CM

## 2023-09-27 ENCOUNTER — Other Ambulatory Visit: Payer: Self-pay | Admitting: Student

## 2023-09-27 DIAGNOSIS — I10 Essential (primary) hypertension: Secondary | ICD-10-CM

## 2023-11-12 ENCOUNTER — Ambulatory Visit: Payer: Medicaid Other | Admitting: Gastroenterology

## 2023-11-12 ENCOUNTER — Encounter: Payer: Self-pay | Admitting: Gastroenterology

## 2023-11-12 VITALS — BP 146/80 | HR 104 | Ht 68.0 in | Wt 249.1 lb

## 2023-11-12 DIAGNOSIS — R112 Nausea with vomiting, unspecified: Secondary | ICD-10-CM | POA: Diagnosis not present

## 2023-11-12 DIAGNOSIS — K219 Gastro-esophageal reflux disease without esophagitis: Secondary | ICD-10-CM

## 2023-11-12 DIAGNOSIS — Z860100 Personal history of colon polyps, unspecified: Secondary | ICD-10-CM

## 2023-11-12 DIAGNOSIS — F17210 Nicotine dependence, cigarettes, uncomplicated: Secondary | ICD-10-CM | POA: Diagnosis not present

## 2023-11-12 DIAGNOSIS — Z860101 Personal history of adenomatous and serrated colon polyps: Secondary | ICD-10-CM | POA: Diagnosis not present

## 2023-11-12 DIAGNOSIS — M6208 Separation of muscle (nontraumatic), other site: Secondary | ICD-10-CM

## 2023-11-12 DIAGNOSIS — R1013 Epigastric pain: Secondary | ICD-10-CM

## 2023-11-12 DIAGNOSIS — K594 Anal spasm: Secondary | ICD-10-CM | POA: Diagnosis not present

## 2023-11-12 DIAGNOSIS — K117 Disturbances of salivary secretion: Secondary | ICD-10-CM

## 2023-11-12 MED ORDER — NA SULFATE-K SULFATE-MG SULF 17.5-3.13-1.6 GM/177ML PO SOLN: 1.0000 | Freq: Once | ORAL | 0 refills | Status: DC

## 2023-11-12 MED ORDER — OMEPRAZOLE 40 MG PO CPDR: 40.0000 mg | DELAYED_RELEASE_CAPSULE | Freq: Every day | ORAL | 1 refills | Status: DC

## 2023-11-12 NOTE — Progress Notes (Unsigned)
HPI :    Colonoscopy 2020 Polyps  Past Medical History:  Diagnosis Date   Arthritis    Back pain    Diabetes mellitus without complication (HCC)    GERD (gastroesophageal reflux disease)    Hyperlipidemia    Hypertension    Trigger finger of right hand    RMF, RRF     Past Surgical History:  Procedure Laterality Date   CARPAL TUNNEL RELEASE Right 03/10/2017   Procedure: CARPAL TUNNEL RELEASE;  Surgeon: Tarry Kos, MD;  Location: [facility];  Service: Orthopedics;  Laterality: Right;   CARPOMETACARPEL SUSPENSION PLASTY Right 03/10/2017   Procedure: Right thumb Ligament Reconstruction Tendon Interposition, Right Carpal tunnel release;  Surgeon: Tarry Kos, MD;  Location: [facility];  Service: Orthopedics;  Laterality: Right;   JOINT REPLACEMENT     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   Procedure: RIGHT TOTAL KNEE ARTHROPLASTY;  Surgeon: Tarry Kos, MD;  [facility];  Service: Orthopedics;  Laterality: Right;   TRIGGER FINGER RELEASE Left 05/26/2017   Procedure: RELEASE TRIGGER FINGER LEFT THUMB;  Surgeon: Tarry Kos, MD;  Location: [facility];  Service: Orthopedics;  Laterality: Left;   TRIGGER FINGER RELEASE Right 02/23/2018   Procedure: RELEASE RIGHT TRIGGER THUMB;  Surgeon: Tarry Kos, MD;  Location: [facility];  Service: Orthopedics;  Laterality: Right;  Bier block   TRIGGER FINGER RELEASE Right 05/06/2022   Procedure: RIGHT MIDDLE & RING TRIGGER FINGER RELEASE;  Surgeon: Tarry Kos, MD;  Location: [facility];  Service: Orthopedics;  Laterality: Right;   TUBAL LIGATION     Family History  Problem Relation Age of Onset   Diabetes Mother    Hypertension Mother    Heart failure Mother    Heart attack Father    Diabetes Sister    Dementia Brother    Hypertension Brother    Diabetes Brother    Hypertension Daughter    Sleep apnea Daughter    Colon cancer Neg Hx    Colon  polyps Neg Hx    Heart disease Neg Hx    Rectal cancer Neg Hx    Stomach cancer Neg Hx    Social History   Tobacco Use   Smoking status: Every Day    Current packs/day: 0.50    Average packs/day: 0.5 packs/day for 33.0 years (16.5 ttl pk-yrs)    Types: Cigarettes   Smokeless tobacco: Never  Vaping Use   Vaping status: Never Used  Substance Use Topics   Alcohol use: Yes    Comment: rarely   Drug use: No   Current Outpatient Medications  Medication Sig Dispense Refill   varenicline (CHANTIX) 1 MG tablet Take 1 mg by mouth 2 (two) times daily. (Patient not taking: Reported on 11/12/2023)     Accu-Chek FastClix Lancets MISC Use as needed to check blood sugar. 102 each 5   ACCU-CHEK GUIDE test strip USE AS INSTRUCTED 100 strip 12   amlodipine-olmesartan (AZOR) 10-20 MG tablet TAKE 1 TABLET BY MOUTH EVERY DAY 90 tablet 0   aspirin EC 81 MG tablet Take 1 tablet (81 mg total) by mouth daily. (Patient taking differently: Take 81 mg by mouth every evening.) 30 tablet 3   Blood Glucose Monitoring Suppl (ACCU-CHEK GUIDE) w/Device KIT 1 Device by Does not apply route in the morning and at bedtime. 1 kit 0   Dulaglutide (TRULICITY) 0.75 MG/0.5ML SOPN Inject 1.5 mg as  directed once a week. 56 mL 0   insulin glargine (LANTUS SOLOSTAR) 100 UNIT/ML Solostar Pen INJECT 10 UNITS INTO THE SKIN AT BEDTIME. (Patient taking differently: as needed.) 15 mL 1   Insulin Pen Needle (BD PEN NEEDLE NANO 2ND GEN) 32G X 4 MM MISC 1 Units by Does not apply route 4 (four) times daily -  before meals and at bedtime. 100 each 2   metFORMIN (GLUCOPHAGE) 1000 MG tablet Take 1 tablet (1,000 mg total) by mouth 2 (two) times daily. 180 tablet 3   omeprazole (PRILOSEC) 40 MG capsule Take 1 capsule (40 mg total) by mouth daily. (Patient not taking: Reported on 11/12/2023) 30 capsule 2   rosuvastatin (CRESTOR) 10 MG tablet TAKE 1 TABLET BY MOUTH EVERY DAY 90 tablet 3   No current facility-administered medications for this  visit.   Allergies  Allergen Reactions   Hydrocodone Hives     Review of Systems: All systems reviewed and negative except where noted in HPI.    No results found.  Physical Exam: BP (!) 150/80 (BP Location: Left Arm, Patient Position: Sitting, Cuff Size: Large)   Pulse (!) 104   Ht 5\' 8"  (1.727 m)   Wt 249 lb 2 oz (113 kg)   LMP 06/23/2013   BMI 37.88 kg/m  Constitutional: Pleasant,well-developed, ***female in no acute distress. HEENT: Normocephalic and atraumatic. Conjunctivae are normal. No scleral icterus. Neck supple.  Cardiovascular: Normal rate, regular rhythm.  Pulmonary/chest: Effort normal and breath sounds normal. No wheezing, rales or rhonchi. Abdominal: Soft, nondistended, nontender. Bowel sounds active throughout. There are no masses palpable. No hepatomegaly. Extremities: no edema Lymphadenopathy: No cervical adenopathy noted. Neurological: Alert and oriented to person place and time. Skin: Skin is warm and dry. No rashes noted. Psychiatric: Normal mood and affect. Behavior is normal.  CBC    Component Value Date/Time   WBC 8.7 09/28/2022 0620   RBC 4.41 09/28/2022 0620   HGB 13.5 09/28/2022 0620   HGB 14.5 08/28/2019 1006   HCT 40.5 09/28/2022 0620   HCT 42.3 08/28/2019 1006   PLT 347 09/28/2022 0620   PLT 391 08/28/2019 1006   MCV 91.8 09/28/2022 0620   MCV 89 08/28/2019 1006   MCH 30.6 09/28/2022 0620   MCHC 33.3 09/28/2022 0620   RDW 14.2 09/28/2022 0620   RDW 13.7 08/28/2019 1006   LYMPHSABS 3.1 09/28/2022 0620   MONOABS 0.5 09/28/2022 0620   EOSABS 0.1 09/28/2022 0620   BASOSABS 0.0 09/28/2022 0620    CMP     Component Value Date/Time   NA 140 03/15/2023 1359   K 4.1 03/15/2023 1359   CL 102 03/15/2023 1359   CO2 19 (L) 03/15/2023 1359   GLUCOSE 140 (H) 03/15/2023 1359   GLUCOSE 140 (H) 09/28/2022 0620   BUN 10 03/15/2023 1359   CREATININE 0.68 03/15/2023 1359   CREATININE 0.76 01/04/2012 1212   CALCIUM 9.4 03/15/2023 1359    PROT 6.6 09/28/2022 0620   ALBUMIN 3.3 (L) 09/28/2022 0620   AST 12 (L) 09/28/2022 0620   ALT 10 09/28/2022 0620   ALKPHOS 93 09/28/2022 0620   BILITOT 0.2 (L) 09/28/2022 0620   GFRNONAA >60 09/28/2022 0620   GFRAA 98 11/12/2020 1412       Latest Ref Rng & Units 09/28/2022    6:20 AM 05/26/2020    1:02 PM 11/26/2019    3:28 AM  CBC EXTENDED  WBC 4.0 - 10.5 K/uL 8.7  13.2  8.5   RBC 3.87 -  5.11 MIL/uL 4.41  5.04  4.42   Hemoglobin 12.0 - 15.0 g/dL 95.6  21.3  08.6   HCT 36.0 - 46.0 % 40.5  46.0  42.3   Platelets 150 - 400 K/uL 347  371  414   NEUT# 1.7 - 7.7 K/uL 5.0     Lymph# 0.7 - 4.0 K/uL 3.1         ASSESSMENT AND PLAN:  History of colon polyps - due for surveillance   Latrelle Dodrill, MD

## 2023-11-12 NOTE — Patient Instructions (Addendum)
We have sent the following medications to your pharmacy for you to pick up at your convenience: Omeprazole 40 mg daily.  You have been scheduled for an endoscopy and colonoscopy. Please follow the written instructions given to you at your visit today.  Please pick up your prep supplies at the pharmacy within the next 1-3 days.  If you use inhalers (even only as needed), please bring them with you on the day of your procedure.  DO NOT TAKE 7 DAYS PRIOR TO TEST- Trulicity (dulaglutide) Ozempic, Wegovy (semaglutide) Mounjaro (tirzepatide) Bydureon Bcise (exanatide extended release)  DO NOT TAKE 1 DAY PRIOR TO YOUR TEST Rybelsus (semaglutide) Adlyxin (lixisenatide) Victoza (liraglutide) Byetta (exanatide)  _______________________________________________________  If your blood pressure at your visit was 140/90 or greater, please contact your primary care physician to follow up on this.  _______________________________________________________  If you are age 59 or older, your body mass index should be between 23-30. Your Body mass index is 37.88 kg/m. If this is out of the aforementioned range listed, please consider follow up with your Primary Care Provider.  If you are age 4 or younger, your body mass index should be between 19-25. Your Body mass index is 37.88 kg/m. If this is out of the aformentioned range listed, please consider follow up with your Primary Care Provider.   ________________________________________________________  The [facility] providers would like to encourage you to use [facility] to communicate with providers for non-urgent requests or questions.  Due to long hold times on the telephone, sending your provider a message by [facility] may be a faster and more efficient way to get a response.  Please allow 48 business hours for a response.  Please remember that this is for non-urgent requests.   It was a pleasure to see you today!  Thank you for trusting me with your  gastrointestinal care!    Scott E.Tomasa Rand, MD

## 2023-11-15 ENCOUNTER — Other Ambulatory Visit: Payer: Self-pay | Admitting: Student

## 2023-11-15 DIAGNOSIS — E119 Type 2 diabetes mellitus without complications: Secondary | ICD-10-CM

## 2023-11-15 DIAGNOSIS — I10 Essential (primary) hypertension: Secondary | ICD-10-CM

## 2023-12-26 ENCOUNTER — Other Ambulatory Visit: Payer: Self-pay | Admitting: Student

## 2023-12-27 ENCOUNTER — Other Ambulatory Visit: Payer: Self-pay | Admitting: Student

## 2023-12-27 ENCOUNTER — Other Ambulatory Visit (HOSPITAL_COMMUNITY): Payer: Self-pay

## 2023-12-27 DIAGNOSIS — E119 Type 2 diabetes mellitus without complications: Secondary | ICD-10-CM

## 2023-12-29 MED ORDER — INSULIN GLARGINE 100 UNIT/ML SOLOSTAR PEN: 10.0000 [IU] | PEN_INJECTOR | SUBCUTANEOUS | 1 refills | Status: DC

## 2023-12-29 NOTE — Addendum Note (Signed)
Addended by: Kathrin Ruddy on: 12/29/2023 09:38 AM   Modules accepted: Orders

## 2023-12-29 NOTE — Telephone Encounter (Signed)
Pharmacy contacted for follow-up of prescription clarification.  Clarified the request for insulin in Lantus PEN device not vial.   Contacted pharmacy for clarification and alternate biological substitution was available and covered for $4.   Cancelled previous prescription for glargine vial.  Patient had not yet picked this up.   Total time with patient call and documentation of interaction: 11 minutes.

## 2023-12-31 ENCOUNTER — Encounter: Payer: Medicaid Other | Admitting: Gastroenterology

## 2024-02-10 ENCOUNTER — Telehealth: Payer: Self-pay | Admitting: Gastroenterology

## 2024-02-10 ENCOUNTER — Other Ambulatory Visit: Payer: Self-pay | Admitting: Gastroenterology

## 2024-02-10 ENCOUNTER — Other Ambulatory Visit: Payer: Self-pay

## 2024-02-10 MED ORDER — NA SULFATE-K SULFATE-MG SULF 17.5-3.13-1.6 GM/177ML PO SOLN: ORAL | 0 refills | Status: DC

## 2024-02-10 NOTE — Telephone Encounter (Signed)
 Prescription for prep sent to pharmacy.

## 2024-02-10 NOTE — Telephone Encounter (Signed)
 Patient called and stated that she has not received her prep medication to her [facility] on [address]. Patient is requesting a call back.Please advise.

## 2024-02-16 ENCOUNTER — Encounter: Payer: Self-pay | Admitting: Gastroenterology

## 2024-02-16 ENCOUNTER — Ambulatory Visit: Payer: Medicaid Other | Admitting: Gastroenterology

## 2024-02-16 VITALS — BP 119/75 | HR 83 | Temp 98.1°F | Resp 17 | Ht 68.0 in | Wt 249.0 lb

## 2024-02-16 DIAGNOSIS — K2289 Other specified disease of esophagus: Secondary | ICD-10-CM | POA: Diagnosis not present

## 2024-02-16 DIAGNOSIS — R1013 Epigastric pain: Secondary | ICD-10-CM

## 2024-02-16 DIAGNOSIS — D124 Benign neoplasm of descending colon: Secondary | ICD-10-CM | POA: Diagnosis not present

## 2024-02-16 DIAGNOSIS — K635 Polyp of colon: Secondary | ICD-10-CM | POA: Diagnosis not present

## 2024-02-16 DIAGNOSIS — K259 Gastric ulcer, unspecified as acute or chronic, without hemorrhage or perforation: Secondary | ICD-10-CM

## 2024-02-16 DIAGNOSIS — Z860100 Personal history of colon polyps, unspecified: Secondary | ICD-10-CM

## 2024-02-16 DIAGNOSIS — Z860101 Personal history of adenomatous and serrated colon polyps: Secondary | ICD-10-CM

## 2024-02-16 DIAGNOSIS — Z1211 Encounter for screening for malignant neoplasm of colon: Secondary | ICD-10-CM | POA: Diagnosis not present

## 2024-02-16 DIAGNOSIS — K2960 Other gastritis without bleeding: Secondary | ICD-10-CM | POA: Diagnosis not present

## 2024-02-16 DIAGNOSIS — K2950 Unspecified chronic gastritis without bleeding: Secondary | ICD-10-CM | POA: Diagnosis not present

## 2024-02-16 DIAGNOSIS — K2980 Duodenitis without bleeding: Secondary | ICD-10-CM | POA: Diagnosis not present

## 2024-02-16 DIAGNOSIS — B9681 Helicobacter pylori [H. pylori] as the cause of diseases classified elsewhere: Secondary | ICD-10-CM

## 2024-02-16 MED ORDER — PANTOPRAZOLE SODIUM 40 MG PO TBEC: 40.0000 mg | DELAYED_RELEASE_TABLET | Freq: Two times a day (BID) | ORAL | 0 refills | Status: DC

## 2024-02-16 MED ORDER — SODIUM CHLORIDE 0.9 % IV SOLN: 500.0000 mL | Freq: Once | INTRAVENOUS | Status: DC

## 2024-02-16 NOTE — Progress Notes (Signed)
 Pt's states no medical or surgical changes since previsit or office visit.

## 2024-02-16 NOTE — Progress Notes (Unsigned)
  Gastroenterology History and Physical   Primary Care Physician:  Levin Erp, MD   Reason for Procedure:   Symptoms of GERD despite PPI, history of colon polyps  Plan:    EGD, colonoscopy     HPI: Melanie Phillips is a 58 y.o. female undergoing EGD to evaluate persistent suspected GERD symptoms (postprandial nausea/vomiting, waterbrash). No heartburn or acid regurgitation. She had 4 small tubular adenomas removed in 2020, due for surveillance colonoscopy.  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  No family history of GI malignancy.   Past Medical History:  Diagnosis Date   Arthritis    Back pain    Diabetes mellitus without complication (HCC)    GERD (gastroesophageal reflux disease)    Hyperlipidemia    Hypertension    Trigger finger of right hand    RMF, RRF    Past Surgical History:  Procedure Laterality Date   CARPAL TUNNEL RELEASE Right 03/10/2017   Procedure: CARPAL TUNNEL RELEASE;  Surgeon: Tarry Kos, MD;  Location: [facility];  Service: Orthopedics;  Laterality: Right;   CARPOMETACARPEL SUSPENSION PLASTY Right 03/10/2017   Procedure: Right thumb Ligament Reconstruction Tendon Interposition, Right Carpal tunnel release;  Surgeon: Tarry Kos, MD;  Location: [facility];  Service: Orthopedics;  Laterality: Right;   JOINT REPLACEMENT     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   Procedure: RIGHT TOTAL KNEE ARTHROPLASTY;  Surgeon: Tarry Kos, MD;  Location: [facility];  Service: Orthopedics;  Laterality: Right;   TRIGGER FINGER RELEASE Left 05/26/2017   Procedure: RELEASE TRIGGER FINGER LEFT THUMB;  Surgeon: Tarry Kos, MD;  Location: [facility];  Service: Orthopedics;  Laterality: Left;   TRIGGER FINGER RELEASE Right 02/23/2018   Procedure: RELEASE RIGHT TRIGGER THUMB;  Surgeon: Tarry Kos, MD;  Location: [facility];  Service: Orthopedics;  Laterality: Right;  Bier block    TRIGGER FINGER RELEASE Right 05/06/2022   Procedure: RIGHT MIDDLE & RING TRIGGER FINGER RELEASE;  Surgeon: Tarry Kos, MD;  Location: [facility];  Service: Orthopedics;  Laterality: Right;   TUBAL LIGATION      Prior to Admission medications   Medication Sig Start Date End Date Taking? Authorizing Provider  Accu-Chek FastClix Lancets MISC Use as needed to check blood sugar. 05/28/20  Yes Arlyce Harman, MD  ACCU-CHEK GUIDE test strip USE AS INSTRUCTED 07/21/23  Yes Levin Erp, MD  amlodipine-olmesartan (AZOR) 10-20 MG tablet TAKE 1 TABLET BY MOUTH EVERY DAY 11/15/23  Yes Levin Erp, MD  aspirin EC 81 MG tablet Take 1 tablet (81 mg total) by mouth daily. Patient taking differently: Take 81 mg by mouth every evening. 08/15/14  Yes Caryl Ada Y, DO  Blood Glucose Monitoring Suppl (ACCU-CHEK GUIDE) w/Device KIT 1 Device by Does not apply route in the morning and at bedtime. 05/28/20  Yes Arlyce Harman, MD  metFORMIN (GLUCOPHAGE) 1000 MG tablet Take 1 tablet (1,000 mg total) by mouth 2 (two) times daily. 04/19/23  Yes Levin Erp, MD  omeprazole (PRILOSEC) 40 MG capsule Take 1 capsule (40 mg total) by mouth daily. 11/12/23  Yes Jenel Lucks, MD  rosuvastatin (CRESTOR) 10 MG tablet TAKE 1 TABLET BY MOUTH EVERY DAY 11/15/23  Yes Levin Erp, MD  Dulaglutide (TRULICITY) 0.75 MG/0.5ML SOPN Inject 1.5 mg as directed once a week. 08/17/23   Levin Erp, MD  insulin glargine (LANTUS) 100 UNIT/ML Solostar Pen Inject 10 Units into  the skin every morning. 12/29/23   McDiarmid, Leighton Roach, MD  Insulin Pen Needle (BD PEN NEEDLE NANO 2ND GEN) 32G X 4 MM MISC 1 Units by Does not apply route 4 (four) times daily -  before meals and at bedtime. 12/13/21   Katha Cabal, DO  varenicline (CHANTIX) 1 MG tablet Take 1 mg by mouth 2 (two) times daily. Patient not taking: Reported on 11/12/2023 07/09/23   [provider]    Current Outpatient Medications   Medication Sig Dispense Refill   Accu-Chek FastClix Lancets MISC Use as needed to check blood sugar. 102 each 5   ACCU-CHEK GUIDE test strip USE AS INSTRUCTED 100 strip 12   amlodipine-olmesartan (AZOR) 10-20 MG tablet TAKE 1 TABLET BY MOUTH EVERY DAY 90 tablet 0   aspirin EC 81 MG tablet Take 1 tablet (81 mg total) by mouth daily. (Patient taking differently: Take 81 mg by mouth every evening.) 30 tablet 3   Blood Glucose Monitoring Suppl (ACCU-CHEK GUIDE) w/Device KIT 1 Device by Does not apply route in the morning and at bedtime. 1 kit 0   metFORMIN (GLUCOPHAGE) 1000 MG tablet Take 1 tablet (1,000 mg total) by mouth 2 (two) times daily. 180 tablet 3   omeprazole (PRILOSEC) 40 MG capsule Take 1 capsule (40 mg total) by mouth daily. 90 capsule 1   rosuvastatin (CRESTOR) 10 MG tablet TAKE 1 TABLET BY MOUTH EVERY DAY 90 tablet 3   Dulaglutide (TRULICITY) 0.75 MG/0.5ML SOPN Inject 1.5 mg as directed once a week. 56 mL 0   insulin glargine (LANTUS) 100 UNIT/ML Solostar Pen Inject 10 Units into the skin every morning. 15 mL 1   Insulin Pen Needle (BD PEN NEEDLE NANO 2ND GEN) 32G X 4 MM MISC 1 Units by Does not apply route 4 (four) times daily -  before meals and at bedtime. 100 each 2   varenicline (CHANTIX) 1 MG tablet Take 1 mg by mouth 2 (two) times daily. (Patient not taking: Reported on 11/12/2023)     Current Facility-Administered Medications  Medication Dose Route Frequency Provider Last Rate Last Admin   0.9 %  sodium chloride infusion  500 mL Intravenous Once Jenel Lucks, MD        Allergies as of 02/16/2024 - Review Complete 02/16/2024  Allergen Reaction Noted   Hydrocodone Hives 12/25/2021    Family History  Problem Relation Age of Onset   Diabetes Mother    Hypertension Mother    Heart failure Mother    Heart attack Father    Diabetes Sister    Dementia Brother    Hypertension Brother    Diabetes Brother    Hypertension Daughter    Sleep apnea Daughter    Colon  cancer Neg Hx    Colon polyps Neg Hx    Heart disease Neg Hx    Rectal cancer Neg Hx    Stomach cancer Neg Hx     Social History   Socioeconomic History   Marital status: Single    Spouse name: Not on file   Number of children: 6   Years of education: Not on file   Highest education level: Not on file  Occupational History   Occupation: disabled  Tobacco Use   Smoking status: Every Day    Current packs/day: 0.50    Average packs/day: 0.5 packs/day for 33.0 years (16.5 ttl pk-yrs)    Types: Cigarettes   Smokeless tobacco: Never  Vaping Use   Vaping status: Never Used  Substance and Sexual Activity   Alcohol use: Yes    Comment: rarely   Drug use: No   Sexual activity: Yes    Birth control/protection: Surgical    Comment: BTL  Other Topics Concern   Not on file  Social History Narrative   Not on file   Social Drivers of Health   Financial Resource Strain: Not on file  Food Insecurity: Not on file  Transportation Needs: Not on file  Physical Activity: Not on file  Stress: Not on file  Social Connections: Unknown (04/14/2022)   Received [facility]   Social Network    Social Network: Not on file  Intimate Partner Violence: Unknown (03/06/2022)   Received from [facility]   HITS    Physically Hurt: Not on file    Insult or Talk Down To: Not on file    Threaten Physical Harm: Not on file    Scream or Curse: Not on file    Review of Systems:  All other review of systems negative except as mentioned in the HPI.  Physical Exam: Vital signs BP 137/84   Pulse 96   Temp 98.1 F (36.7 C)   Ht 5\' 8"  (1.727 m)   Wt 249 lb (112.9 kg)   LMP 06/23/2013   SpO2 95%   BMI 37.86 kg/m   General:   Alert,  Well-developed, well-nourished, pleasant and cooperative in NAD Airway:  Mallampati 2 Lungs:  Clear throughout to auscultation.   Heart:  Regular rate and rhythm; no murmurs, clicks, rubs,  or gallops. Abdomen:  Soft,  nontender and nondistended. Normal bowel sounds.   Neuro/Psych:  Normal mood and affect. A and O x 3   Guida Asman E. Tomasa Rand, MD [facility]

## 2024-02-16 NOTE — Op Note (Signed)
 [facility] Patient Name: Melanie Phillips Procedure Date: 02/16/2024 2:40 PM MRN: [id number] Endoscopist: Lorin Picket E. Tomasa Rand , MD, [id number] Age: 58 Referring MD:  Date of Birth: Mar 31, 1966 Gender: Female Account #: [id number] Procedure:                Upper GI endoscopy Indications:              Suspected esophageal reflux, Nausea with vomiting Medicines:                Monitored Anesthesia Care Procedure:                Pre-Anesthesia Assessment:                           - Prior to the procedure, a History and Physical                            was performed, and patient medications and                            allergies were reviewed. The patient's tolerance of                            previous anesthesia was also reviewed. The risks                            and benefits of the procedure and the sedation                            options and risks were discussed with the patient.                            All questions were answered, and informed consent                            was obtained. Prior Anticoagulants: The patient has                            taken no anticoagulant or antiplatelet agents. ASA                            Grade Assessment: II - A patient with mild systemic                            disease. After reviewing the risks and benefits,                            the patient was deemed in satisfactory condition to                            undergo the procedure.                           After obtaining informed consent, the endoscope was  passed under direct vision. Throughout the                            procedure, the patient's blood pressure, pulse, and                            oxygen saturations were monitored continuously. The                            GIF [id number] was introduced through the                            mouth, and advanced to the second part of duodenum.                             The upper GI endoscopy was accomplished without                            difficulty. The patient tolerated the procedure                            well. Scope In: Scope Out: Findings:                 The examined portions of the nasopharynx,                            oropharynx and larynx were normal.                           The Z-line was irregular.                           The exam of the esophagus was otherwise normal.                           Several non-bleeding superficial gastric ulcers                            with no stigmata of bleeding were found in the                            prepyloric region of the stomach. The largest                            lesion was 5 mm in largest dimension. Biopsies were                            taken with a cold forceps for Helicobacter pylori                            testing. Estimated blood loss was minimal.                           The exam of the stomach was otherwise  normal.                           Diffuse nodular and inflamed mucosa was found in                            the duodenal bulb. Biopsies were taken with a cold                            forceps for histology. Estimated blood loss was                            minimal.                           The exam of the duodenum was otherwise normal. Complications:            No immediate complications. Estimated Blood Loss:     Estimated blood loss was minimal. Impression:               - The examined portions of the nasopharynx,                            oropharynx and larynx were normal.                           - Z-line irregular.                           - Non-bleeding gastric ulcers with no stigmata of                            bleeding. Biopsied.                           - Nodular mucosa in the duodenal bulb. Biopsied. Recommendation:           - Patient has a contact number available for                            emergencies. The signs and symptoms of  potential                            delayed complications were discussed with the                            patient. Return to normal activities tomorrow.                            Written discharge instructions were provided to the                            patient.                           - Resume previous diet.                           -  Start pantoprazole 40 mg PO BID for 8 weeks.                           - Await pathology results.                           - Repeat upper endoscopy in 8 weeks to check                            healing.                           - Avoid NSAIDs. Raynah Gomes E. Tomasa Rand, MD 02/16/2024 3:37:55 PM This report has been signed electronically.

## 2024-02-16 NOTE — Patient Instructions (Addendum)
 Resume previous diet and medications.  Handout provided on polyps and gastritis.  Follow up colonoscopy based on biopsy results.  Start pantoprazole 40mg  two times a day for 8 weeks.  Repeat endoscopy in 8 weeks on Mat 7th.  A nurse will call you for your pre-visit two weeks before your endoscopy.      YOU HAD AN ENDOSCOPIC PROCEDURE TODAY AT THE [facility]:   Refer to the procedure report that was given to you for any specific questions about what was found during the examination.  If the procedure report does not answer your questions, please call your gastroenterologist to clarify.  If you requested that your care partner not be given the details of your procedure findings, then the procedure report has been included in a sealed envelope for you to review at your convenience later.  YOU SHOULD EXPECT: Some feelings of bloating in the abdomen. Passage of more gas than usual.  Walking can help get rid of the air that was put into your GI tract during the procedure and reduce the bloating. If you had a lower endoscopy (such as a colonoscopy or flexible sigmoidoscopy) you may notice spotting of blood in your stool or on the toilet paper. If you underwent a bowel prep for your procedure, you may not have a normal bowel movement for a few days.  Please Note:  You might notice some irritation and congestion in your nose or some drainage.  This is from the oxygen used during your procedure.  There is no need for concern and it should clear up in a day or so.  SYMPTOMS TO REPORT IMMEDIATELY:  Following lower endoscopy (colonoscopy or flexible sigmoidoscopy):  Excessive amounts of blood in the stool  Significant tenderness or worsening of abdominal pains  Swelling of the abdomen that is new, acute  Fever of 100F or higher  Following upper endoscopy (EGD)  Vomiting of blood or coffee ground material  New chest pain or pain under the shoulder blades  Painful or persistently difficult  swallowing  New shortness of breath  Fever of 100F or higher  Black, tarry-looking stools  For urgent or emergent issues, a gastroenterologist can be reached at any hour by calling [phone]. Do not use MyChart messaging for urgent concerns.    DIET:  We do recommend a small meal at first, but then you may proceed to your regular diet.  Drink plenty of fluids but you should avoid alcoholic beverages for 24 hours.  ACTIVITY:  You should plan to take it easy for the rest of today and you should NOT DRIVE or use heavy machinery until tomorrow (because of the sedation medicines used during the test).    FOLLOW UP: Our staff will call the number listed on your records the next business day following your procedure.  We will call around 7:15- 8:00 am to check on you and address any questions or concerns that you may have regarding the information given to you following your procedure. If we do not reach you, we will leave a message.     If any biopsies were taken you will be contacted by phone or by letter within the next 1-3 weeks.  Please call us at [phone] if you have not heard about the biopsies in 3 weeks.    SIGNATURES/CONFIDENTIALITY: You and/or your care partner have signed paperwork which will be entered into your electronic medical record.  These signatures attest to the fact that that the information  above on your After Visit Summary has been reviewed and is understood.  Full responsibility of the confidentiality of this discharge information lies with you and/or your care-partner.

## 2024-02-16 NOTE — Op Note (Signed)
 [facility] Patient Name: Melanie Phillips Procedure Date: 02/16/2024 2:30 PM MRN: [id number] Endoscopist: Lorin Picket E. Tomasa Rand , MD, [id number] Age: 58 Referring MD:  Date of Birth: 12/06/1965 Gender: Female Account #: [id number] Procedure:                Colonoscopy Indications:              High risk colon cancer surveillance: Personal                            history of multiple (3 or more) adenomas Medicines:                Monitored Anesthesia Care Procedure:                Pre-Anesthesia Assessment:                           - Prior to the procedure, a History and Physical                            was performed, and patient medications and                            allergies were reviewed. The patient's tolerance of                            previous anesthesia was also reviewed. The risks                            and benefits of the procedure and the sedation                            options and risks were discussed with the patient.                            All questions were answered, and informed consent                            was obtained. Prior Anticoagulants: The patient has                            taken no anticoagulant or antiplatelet agents. ASA                            Grade Assessment: II - A patient with mild systemic                            disease. After reviewing the risks and benefits,                            the patient was deemed in satisfactory condition to                            undergo the procedure.  After obtaining informed consent, the colonoscope                            was passed under direct vision. Throughout the                            procedure, the patient's blood pressure, pulse, and                            oxygen saturations were monitored continuously. The                            Olympus CF-HQ190L ([id number]) Colonoscope was                            introduced through  the anus and advanced to the the                            cecum, identified by appendiceal orifice and                            ileocecal valve. The bowel preparation used was                            SUPREP via split dose instruction. Scope In: 3:03:42 PM Scope Out: 3:23:39 PM Scope Withdrawal Time: 0 hours 13 minutes 43 seconds  Total Procedure Duration: 0 hours 19 minutes 57 seconds  Findings:                 The perianal and digital rectal examinations were                            normal. Pertinent negatives include normal                            sphincter tone and no palpable rectal lesions.                           A 4 mm polyp was found in the descending colon. The                            polyp was sessile. The polyp was removed with a                            cold snare. Resection and retrieval were complete.                            Estimated blood loss was minimal.                           The exam was otherwise normal throughout the                            examined colon.  The retroflexed view of the distal rectum and anal                            verge was normal and showed no anal or rectal                            abnormalities. Complications:            No immediate complications. Estimated Blood Loss:     Estimated blood loss was minimal. Impression:               - One 4 mm polyp in the descending colon, removed                            with a cold snare. Resected and retrieved.                           - The distal rectum and anal verge are normal on                            retroflexion view. Recommendation:           - Patient has a contact number available for                            emergencies. The signs and symptoms of potential                            delayed complications were discussed with the                            patient. Return to normal activities tomorrow.                             Written discharge instructions were provided to the                            patient.                           - Resume previous diet.                           - Continue present medications.                           - Await pathology results.                           - Repeat colonoscopy (date not yet determined) for                            surveillance based on pathology results. Melanie Phillips E. Tomasa Rand, MD 02/16/2024 3:42:25 PM This report has been signed electronically.

## 2024-02-16 NOTE — Progress Notes (Signed)
 Called to room to assist during endoscopic procedure.  Patient ID and intended procedure confirmed with present staff. Received instructions for my participation in the procedure from the performing physician.

## 2024-02-17 ENCOUNTER — Telehealth: Payer: Self-pay

## 2024-02-17 NOTE — Telephone Encounter (Signed)
  Follow up Call-  Could not leave message, mailbox full.

## 2024-02-21 LAB — SURGICAL PATHOLOGY

## 2024-02-25 ENCOUNTER — Encounter: Payer: Self-pay | Admitting: Gastroenterology

## 2024-02-29 ENCOUNTER — Telehealth: Payer: Self-pay

## 2024-02-29 ENCOUNTER — Other Ambulatory Visit: Payer: Self-pay

## 2024-02-29 MED ORDER — DOXYCYCLINE HYCLATE 100 MG PO CAPS: 100.0000 mg | ORAL_CAPSULE | Freq: Two times a day (BID) | ORAL | 0 refills | Status: DC

## 2024-02-29 MED ORDER — METRONIDAZOLE 250 MG PO TABS: 250.0000 mg | ORAL_TABLET | Freq: Four times a day (QID) | ORAL | 0 refills | Status: DC

## 2024-02-29 MED ORDER — BISMUTH 262 MG PO CHEW: 524.0000 mg | CHEWABLE_TABLET | Freq: Four times a day (QID) | ORAL | 0 refills | Status: DC

## 2024-02-29 NOTE — Telephone Encounter (Signed)
-----   Message from Nurse Kerrie Buffalo sent at 02/25/2024  3:02 PM EDT ----- Regarding: FW: Biopsy results  ----- Message ----- From: Jenel Lucks, MD Sent: 02/25/2024   2:41 PM EDT To: [facility] Triage Subject: Biopsy results                                 Team,   Please call Ms. Essick and relay the following message, place the prescriptions (she already pantoprazole) and make sure she is scheduled for a repeat EGD:  Ms. Deans, The biopsies from your recent upper GI Endoscopy were notable for H. Pylori gastritis.  This bacteria can cause ulcers, abdominal pain, nausea and increase the risk of stomach cancer, and thus needs to be eradicated.  We will plan on treating with quad therapy as below. Please confirm no medication allergies to the prescribed regimen.   1) Pantoprazole 40 mg 2 times a day (already taking) 2) Pepto Bismol 2 tabs (262 mg each) 4 times a day x 14 d 3) Metronidazole 250 mg 4 times a day x 14 d 4) doxycycline 100 mg 2 times a day x 14 d  Plan to repeat EGD in 8 weeks as discussed to assess healing of ulcers.  We will take repeat biopsies at that time to assess for eradication of the bacteria.   Good news: the polyp that I removed during your colonoscopy was NOT precancerous.  You should continue to follow current colorectal cancer screening guidelines with a repeat colonoscopy in 10 years.     Thanks

## 2024-02-29 NOTE — Telephone Encounter (Signed)
 Scripts sent to pharmacy, pt already scheduled for previsit and EGD. Attempted to call pt and received message that voicemail box is full and cannot accept messages at this time. Will try again to reach pt again later.  Pt called back she is aware of results, scripts being sent in and the appts.

## 2024-03-07 ENCOUNTER — Other Ambulatory Visit: Payer: Self-pay | Admitting: Student

## 2024-03-07 DIAGNOSIS — I10 Essential (primary) hypertension: Secondary | ICD-10-CM

## 2024-03-14 ENCOUNTER — Other Ambulatory Visit: Payer: Self-pay | Admitting: Gastroenterology

## 2024-03-14 ENCOUNTER — Other Ambulatory Visit: Payer: Self-pay | Admitting: Student

## 2024-03-14 DIAGNOSIS — I10 Essential (primary) hypertension: Secondary | ICD-10-CM

## 2024-03-22 ENCOUNTER — Encounter

## 2024-03-22 ENCOUNTER — Telehealth: Payer: Self-pay

## 2024-03-22 NOTE — Telephone Encounter (Signed)
 Unable to reach pt for PV apt. Unable to leave VM. Mailbox is full. Pt does not have MyChart account. PV and  colonoscopy will be cancelled if pt does not call to r/s PV apt. No show letter will be sent to address on file if pt does not r/s by 5 pm.

## 2024-03-28 ENCOUNTER — Encounter: Admitting: Student

## 2024-03-28 NOTE — Progress Notes (Deleted)
    SUBJECTIVE:   Chief compliant/HPI: annual examination  Melanie Phillips is a 58 y.o. who presents today for an annual exam.   History tabs reviewed and updated ***.   Review of systems form reviewed and notable for ***.   Discussed the use of AI scribe software for clinical note transcription with the patient, who gave verbal consent to proceed.  History of Present Illness     OBJECTIVE:   LMP 06/23/2013   ***  ASSESSMENT/PLAN:   Assessment & Plan  Annual Examination  See AVS for age appropriate recommendations.  PHQ score     08/17/2023   11:38 AM 03/15/2023    1:38 PM 03/04/2023   10:55 AM 08/28/2022   11:52 AM 07/10/2022    1:46 PM  Depression screen PHQ 2/9  Decreased Interest 2 0 1 0 0  Down, Depressed, Hopeless 1 0 0 0 1  PHQ - 2 Score 3 0 1 0 1  Altered sleeping 3 2 1  0 2  Tired, decreased energy 2 1 2  0 0  Change in appetite 0 0 0 0 0  Feeling bad or failure about yourself  2 0 1 0 0  Trouble concentrating 1 0 0 0 0  Moving slowly or fidgety/restless 1 0 0 0 0  Suicidal thoughts 0 0 0 0 0  PHQ-9 Score 12 3 5  0 3  Difficult doing work/chores  Somewhat difficult       Blood pressure value is *** goal, discussed.   Considered the following screening exams based upon USPSTF recommendations: Diabetes screening: {discussed/ordered:14545} Screening for elevated cholesterol: {discussed/ordered:14545} HIV testing: {discussed/ordered:14545} Hepatitis C: {discussed/ordered:14545} Hepatitis B: {discussed/ordered:14545} Syphilis if at high risk: {discussed/ordered:14545} Reviewed risk factors for latent tuberculosis and {not indicated/requested/declined:14582} Colorectal cancer screening: {crcscreen:23821::"discussed, colonoscopy ordered"} Lung cancer screening: {discussed/declined/written AOZH:08657} See documentation below regarding discussion and indication.  PSA discussed and after engaging in discussion of possible risks, benefits and complications of  screening patient elected to ***.   Follow up in 1 year or sooner if indicated.  {MYCHARTLIST:32522}   Genora Kidd, MD [facility]

## 2024-04-05 ENCOUNTER — Encounter: Admitting: Gastroenterology

## 2024-04-11 ENCOUNTER — Other Ambulatory Visit: Payer: Self-pay | Admitting: Student

## 2024-04-11 DIAGNOSIS — I10 Essential (primary) hypertension: Secondary | ICD-10-CM

## 2024-05-11 ENCOUNTER — Other Ambulatory Visit: Payer: Self-pay | Admitting: Gastroenterology

## 2024-05-19 ENCOUNTER — Other Ambulatory Visit: Payer: Self-pay | Admitting: Gastroenterology

## 2024-05-19 ENCOUNTER — Other Ambulatory Visit: Payer: Self-pay | Admitting: Student

## 2024-05-19 DIAGNOSIS — I10 Essential (primary) hypertension: Secondary | ICD-10-CM

## 2024-05-30 ENCOUNTER — Ambulatory Visit

## 2024-05-30 NOTE — Progress Notes (Deleted)
    SUBJECTIVE:   CHIEF COMPLAINT / HPI:   ***  PERTINENT  PMH / PSH: ***  OBJECTIVE:   LMP 06/23/2013   ***  ASSESSMENT/PLAN:   Assessment & Plan      Camie Dixons, DO [facility]

## 2024-06-12 ENCOUNTER — Telehealth: Payer: Self-pay

## 2024-06-12 ENCOUNTER — Other Ambulatory Visit: Payer: Self-pay

## 2024-06-12 ENCOUNTER — Ambulatory Visit (INDEPENDENT_AMBULATORY_CARE_PROVIDER_SITE_OTHER)

## 2024-06-12 VITALS — BP 138/83 | HR 99 | Ht 68.0 in | Wt 242.0 lb

## 2024-06-12 DIAGNOSIS — Z794 Long term (current) use of insulin: Secondary | ICD-10-CM

## 2024-06-12 DIAGNOSIS — H93292 Other abnormal auditory perceptions, left ear: Secondary | ICD-10-CM | POA: Diagnosis not present

## 2024-06-12 DIAGNOSIS — E119 Type 2 diabetes mellitus without complications: Secondary | ICD-10-CM

## 2024-06-12 DIAGNOSIS — L84 Corns and callosities: Secondary | ICD-10-CM | POA: Diagnosis not present

## 2024-06-12 LAB — POCT GLYCOSYLATED HEMOGLOBIN (HGB A1C): HbA1c, POC (controlled diabetic range): 9.8 % — AB (ref 0.0–7.0)

## 2024-06-12 MED ORDER — ACCU-CHEK FASTCLIX LANCETS MISC: 5 refills | Status: DC

## 2024-06-12 MED ORDER — ACCU-CHEK GUIDE TEST VI STRP: ORAL_STRIP | 12 refills | Status: DC

## 2024-06-12 MED ORDER — ACCU-CHEK GUIDE W/DEVICE KIT: 1.0000 | PACK | Freq: Two times a day (BID) | 0 refills | Status: AC

## 2024-06-12 MED ORDER — TRULICITY 0.75 MG/0.5ML ~~LOC~~ SOAJ: 0.7500 mg | SUBCUTANEOUS | 0 refills | Status: DC

## 2024-06-12 NOTE — Telephone Encounter (Signed)
 Also received fax from [facility] on [address] for. Olmesartan  Medoxomil 20MG  tab and Varenicline  1MG  tab. That I dont see on current medication list. If you want this patient to start those medications. Please send Rx to pharmacy with instructions, dose and duration. Thank you

## 2024-06-12 NOTE — Telephone Encounter (Signed)
 Patient calls nurse line in regards to Trulicity  and DM testing supplies.   She reports she went to the pharmacy to pick these up, however they stated they did not have anything.   I attempted to call [facility] to get more information, however with their new system you must leave a VM.  Will await their return call for assistance.

## 2024-06-12 NOTE — Progress Notes (Signed)
    SUBJECTIVE:   CHIEF COMPLAINT / HPI:   Melanie Phillips is a 58 year old female who presents to the office with primary complaint of intermittent right foot pain and possible infection.  She states the pain is located below the fifth metatarsal on the plantar side and started about 2 months ago.  She denies any ulceration, drainage, bleeding at the site, or trauma.  She does not wear socks with her shoes.  She has tried Vaseline and cleaning with hydrogen peroxide without much relief.  She cannot pinpoint an inciting factor.  She states her numbness of the right foot has been gradually worsening over the last 2 months.  Left ear auditory sensation: Patient states for the last 4 to 5 months she has experienced roaring in the left ear when she looks down into the right.  This resolves when she looks up straight.  It is intermittent and is not associated with any light headedness, dizziness, vertigo, vision changes, headache, neck pain, or any other complaints.  PERTINENT  PMH / PSH: HTN, uncontrolled DM type II  OBJECTIVE:   BP 138/83   Pulse 99   Ht 5' 8 (1.727 m)   Wt 242 lb (109.8 kg)   LMP 06/23/2013   SpO2 98%   BMI 36.80 kg/m    General: A&O, NAD, pleasant HEENT: No sign of trauma, EOM grossly intact, PERRLA, mild cerumen impaction of bilateral ears, no overlying skin changes of the ear canal Cardiac: RRR, no m/r/g, no carotid bruit Respiratory: CTAB, normal WOB, no w/c/r GI: Soft, NTTP, non-distended  Extremities: NTTP, no peripheral edema, erythema of the right foot, no tenderness to palpation over the right foot except for pinpoint tenderness over firm tissue most likely callus formation to the base of the right fifth metatarsal with some skin discoloration on the plantar side, no ecchymoses, no breaks in skin, no bleeding, no drainage, full range of motion of the right ankle and toes, DP pulses +2, hyperkeratotic toenails Neuro: Normal gait, moves all four extremities  appropriately. Psych: Appropriate mood and affect   ASSESSMENT/PLAN:   Assessment & Plan Type 2 diabetes mellitus without complication, with long-term current use of insulin  (HCC) A1c elevated to 9.8 today from 7.0 10 months ago.  Patient admits she has not restarted her Trulicity  since stopping it for her EGD back in March.  - Restart Trulicity  at starting dose of 0.75 mg.  This has been sent to the pharmacy. -Follow-up with Dr. Ruthann on Thursday, July 17 at 11 AM for libre study and diabetes check. - Counseled on importance of medication adherence diet, exercise, and general diabetic lifestyle changes.  Return in 3 months for A1c and diabetes follow-up. Foot callus Located to the lateral right foot at the base of the right fifth metatarsal.  Only painful when palpated.  No significant overlying skin changes signifying infection. -Discussed practicing good foot hygiene with warm Epsom salt soaks, no scrubbing to prevent breaks in the skin, and wearing socks with footwear.  -Referral to podiatry sent. Abnormal auditory perception of left ear Only when looking down into the right does she hear roaring in the left ear.  No carotid bruit on exam. -Referral to ENT sent.     Camie Dixons, DO [facility]

## 2024-06-12 NOTE — Addendum Note (Signed)
 Addended by: GLENDIA DAMIEN SQUIBB on: 06/12/2024 05:06 PM   Modules accepted: Orders

## 2024-06-12 NOTE — Addendum Note (Signed)
 Addended by: DONAH LAYMON PARAS on: 06/12/2024 05:01 PM   Modules accepted: Orders

## 2024-06-12 NOTE — Assessment & Plan Note (Signed)
 A1c elevated to 9.8 today from 7.0 10 months ago.  Patient admits she has not restarted her Trulicity  since stopping it for her EGD back in March.  - Restart Trulicity  at starting dose of 0.75 mg.  This has been sent to the pharmacy. -Follow-up with Dr. Ruthann on Thursday, July 17 at 11 AM for libre study and diabetes check. - Counseled on importance of medication adherence diet, exercise, and general diabetic lifestyle changes.  Return in 3 months for A1c and diabetes follow-up.

## 2024-06-12 NOTE — Patient Instructions (Addendum)
 It was wonderful to see you today.  Please bring ALL of your medications with you to every visit.   Today we talked about:  Your R foot pain. This is due to a callus, not a foot infection. I would like to you see podiatry and have sent this referral in. Practice good foot hygiene with epsom salt soaks and wear socks with your shoes.   Your diabetes. Your A1C is 9.8 today which is significantly higher than last checked. Please resume your Trulicity  at the lowest dose. I have sent this into the pharmacy. I would like you to discuss joining the [place] trial and your diabetes with Dr. Koval. I have set up this appointment up for Thursday, July 17 at 11 AM.  I have referred you to an ENT doctor for the roaring in your ear. Diabetic eye exam. I have referred you to an eye doctor to get an annual diabetic eye exam.  Please return to the clinic in 3 months for a diabetic follow up and A1C check.   Thank you for choosing [facility].   Please call [phone] with any questions about today's appointment.  Please arrive at least 15 minutes prior to your scheduled appointments.   If you had blood work today, I will send you a MyChart message or a letter if results are normal. Otherwise, I will give you a call.   If you had a referral placed, they will call you to set up an appointment. Please give us  a call if you don't hear back in the next 2 weeks.   If you need additional refills before your next appointment, please call your pharmacy first.   You should follow up in our clinic in 3 months for DM and A1C check up.   Camie Dixons, DO Family Medicine

## 2024-06-12 NOTE — Telephone Encounter (Signed)
 [facility] returns call to nurse line.   She reports Trulicity  is ready for pick up.  She reports she received the ACCU-CHEK meter, however needs a prescription for testing strips and lancets.   Will forward to provider who saw patient this morning.

## 2024-06-13 ENCOUNTER — Other Ambulatory Visit (HOSPITAL_COMMUNITY): Payer: Self-pay

## 2024-06-13 MED ORDER — ACCU-CHEK GUIDE TEST VI STRP: ORAL_STRIP | 12 refills | Status: AC

## 2024-06-13 MED ORDER — ACCU-CHEK FASTCLIX LANCETS MISC: 12 refills | Status: AC

## 2024-06-13 NOTE — Telephone Encounter (Signed)
 Returned call to patient. She reports that she was able to call insurance company and was able to get override codes for glucometer.   Received verbal orders from Dr. Lupie to send in supplies for glucometer.   Chiquita JAYSON English, RN

## 2024-06-13 NOTE — Telephone Encounter (Signed)
 Patient LVM this morning regarding issues with meter and supplies. Lancets will need to be sent to the pharmacy.   Pharmacist advised that Medicaid does not cover meter but will cover other testing supplies.   [employer]. She is going to assist with calling pharmacy to see if we can get cost lowered.   Chiquita JAYSON English, RN

## 2024-06-13 NOTE — Addendum Note (Signed)
 Addended by: Shelita Steptoe C on: 06/13/2024 03:11 PM   Modules accepted: Orders

## 2024-06-15 ENCOUNTER — Ambulatory Visit: Admitting: Pharmacist

## 2024-06-15 ENCOUNTER — Encounter: Payer: Self-pay | Admitting: Pharmacist

## 2024-06-15 VITALS — BP 122/77 | HR 91 | Wt 237.6 lb

## 2024-06-15 DIAGNOSIS — E119 Type 2 diabetes mellitus without complications: Secondary | ICD-10-CM | POA: Diagnosis not present

## 2024-06-15 DIAGNOSIS — Z794 Long term (current) use of insulin: Secondary | ICD-10-CM | POA: Diagnosis not present

## 2024-06-15 MED ORDER — FREESTYLE LIBRE 3 PLUS SENSOR MISC: 11 refills | Status: DC

## 2024-06-15 NOTE — Progress Notes (Signed)
 S:     Chief Complaint  Patient presents with   Medication Management    Diabetes - CGM Monitoring - Patient taking insulin    58 y.o. female who presents for diabetes evaluation, education, and management. Patient arrives in good spirits and presents without any assistance.   Patient was referred and last seen by Primary Care Provider, Dr. Lupie, on 06/12/2024.  At last visit, patient was found to have A1C elevation to 9.8.  She was referred for CGM education AND consideration of Liberate study.  Patient has been on insulin  for many years and does not qualify for the study however, we proceeded with CGM education and this was the focus of the visit.  Patient has IPhone 16 - recent purchase - we attempted to download however, the APP download was blocked the purchase/credit card issues.  After multiple attempts, including calling other family members to assist with password support, we agreed that she should try to download and initiate use outside the office.  She reports family support and knowing someone who uses a Libre CGM currently.  The Majority of our visit was spent on technology - connectivity issues.   PMH is significant for hypertension, Tobacco Used Disorder, GERD Patient reports Diabetes was diagnosed in 2015.    Current diabetes medications include: Trulicity  (dulaglutide ) 0.75mg  weekly, Lantus  (insulin  glargine) 10 units daily, metformin  1000mg  BID Current hypertension medications include: amlodipine /olmesartan  10/20 daily  Current hyperlipidemia medications include: rosuvastatin  10mg  daily   Patient reports adherence to taking all medications as prescribed.   Do you feel that your medications are working for you? yes Have you been experiencing any side effects to the medications prescribed? no Insurance coverage: [place] Medicaid  Patient denies hypoglycemic events.   O:   Review of Systems  All other systems reviewed and are negative.   Physical Exam Vitals  reviewed.  Constitutional:      Appearance: Normal appearance.  Pulmonary:     Effort: Pulmonary effort is normal.  Neurological:     Mental Status: She is alert.  Psychiatric:        Mood and Affect: Mood normal.        Thought Content: Thought content normal.     Lab Results  Component Value Date   HGBA1C 9.8 (A) 06/12/2024   Vitals:   06/15/24 1145  BP: 122/77  Pulse: 91  SpO2: 97%    Lipid Panel     Component Value Date/Time   CHOL 137 11/12/2020 1412   TRIG 144 11/12/2020 1412   HDL 50 11/12/2020 1412   CHOLHDL 2.7 11/12/2020 1412   CHOLHDL 5.1 08/11/2014 0005   VLDL 32 08/11/2014 0005   LDLCALC 62 11/12/2020 1412   Patient is participating in a Managed Medicaid Plan:  Yes   A/P: Diabetes longstanding since 2015 and reports use of insulin  since 2017.  Did not qualify for Liberate study due to use of insulin .  Patient is able to verbalize appropriate hypoglycemia management plan. Medication adherence appears good.  - Continued Lantus  (insulin  glargine) 10 units once daily. - Continued GLP-1 Trulicity  (dulaglutide ) 0.75 mg weekly  - Continued metformin  1000mg  BID  -Patient educated on CGM application and use.  Provided instruction adequate for at home start when APP is ready. Single sample sensor provided and new prescription for sensors sent to local pharmacy.  IF patient has issues with connection following APP download, I encouraged her to return for support.   Tobacco Use Disorder - 1 ppd current  intake. Quit for 4 months in the past. Considers quitting.  Encouraged to reduce intake to < 1ppd by next visit and plan for additional discussion.   Written patient instructions provided. Patient verbalized understanding of treatment plan.  Total time in face to face counseling 27 minutes.    Follow-up:  Pharmacist ~ 2 weeks for CGM review of control.  PCP clinic visit in TBD

## 2024-06-15 NOTE — Assessment & Plan Note (Signed)
 Diabetes longstanding since 2015 and reports use of insulin  since 2017.  Did not qualify for Liberate study due to use of insulin .  Patient is able to verbalize appropriate hypoglycemia management plan. Medication adherence appears good.  - Continued Lantus  (insulin  glargine) 10 units once daily. - Continued GLP-1 Trulicity  (dulaglutide ) 0.75 mg weekly  - Continued metformin  1000mg  BID  -Patient educated on CGM application and use.  Provided instruction adequate for at home start when APP is ready. Single sample sensor provided and new prescription for sensors sent to local pharmacy.  IF patient has issues with connection following APP download, I encouraged her to return for support.

## 2024-06-15 NOTE — Patient Instructions (Addendum)
 Nice to meet you today.   I hope you enjoy your glucose monitor.   Return to me sooner if you are unable to start the APP and sensor without my help.    Sensor Application If using the App, you can tap Help in the Main Menu to access an in-app tutorial on applying a Sensor. See below for instructions on how to download the app. Apply Sensors only on the back of your upper arm. If placed in other areas, the Sensor may not function properly and could give you inaccurate readings. Avoid areas with scars, moles, stretch marks, or lumps.   Select an area of skin that generally stays flat during your normal daily activities (no bending or folding). Choose a site that is at least 1 inch (2.5 cm) away from any injection sites. To prevent discomfort or skin irritation, you should select a different site other than the one most recently used. Wash application site using a plain soap, dry, and then clean with an alcohol  wipe. This will help remove any oily residue that may prevent the sensor from sticking properly. Allow site to air dry before proceeding. Note: The area MUST be clean and dry, or the Sensor may not stay on for the full wear duration specified by your Sensor insert. 4. Unscrew the cap from the Sensor Applicator and set the cap aside.  5. Place the Sensor Applicator over the prepared site and push down firmly to apply the Sensor to your body. 6. Gently pull the Sensor Applicator away from your body. The Sensor should now be attached to your skin. 7. Make sure the Sensor is secure after application. Put the cap back on the Sensor Applicator. Discard the used Engineer, agricultural according to local regulations.  What If My Sensor Falls Off or What If My Sensor Isn't Working? Call [employer] Customer Care Team at [phone] Available 7 days a week from 8AM-8PM EST, excluding holidays If you have multiple sensors fall, contact [facility] at [phone]   The App Download the  FreeStyle [place] 3 App in your phone's app store   Load the app and select get started now Create an account - Password needs to have 12 characters, capital letter, symbol and number required Tap scan new sensor Follow the prompts on the screen. If your sensor does not sync, try moving your phone slowly around the sensor. Phone cases may affect scanning. This will be the only time you have to scan the sensor until you apply a new sensor.   There will be a 60 minute start up period until the app will display your glucose reading   How To Share Your Readings With Us  Once in the app, go to settings -> connected apps -> LibreView -> Enter Practice ID -> [id number]

## 2024-06-16 ENCOUNTER — Other Ambulatory Visit: Payer: Self-pay

## 2024-06-16 ENCOUNTER — Ambulatory Visit (INDEPENDENT_AMBULATORY_CARE_PROVIDER_SITE_OTHER): Admitting: Podiatry

## 2024-06-16 ENCOUNTER — Encounter: Payer: Self-pay | Admitting: Podiatry

## 2024-06-16 DIAGNOSIS — M79671 Pain in right foot: Secondary | ICD-10-CM

## 2024-06-16 DIAGNOSIS — B351 Tinea unguium: Secondary | ICD-10-CM | POA: Diagnosis not present

## 2024-06-16 DIAGNOSIS — M79672 Pain in left foot: Secondary | ICD-10-CM | POA: Diagnosis not present

## 2024-06-16 DIAGNOSIS — N6320 Unspecified lump in the left breast, unspecified quadrant: Secondary | ICD-10-CM

## 2024-06-16 LAB — HEPATIC FUNCTION PANEL
AG Ratio: 1.5 (calc) (ref 1.0–2.5)
ALT: 9 U/L (ref 6–29)
AST: 12 U/L (ref 10–35)
Albumin: 4.1 g/dL (ref 3.6–5.1)
Alkaline phosphatase (APISO): 108 U/L (ref 37–153)
Bilirubin, Direct: 0.1 mg/dL (ref 0.0–0.2)
Globulin: 2.8 g/dL (ref 1.9–3.7)
Indirect Bilirubin: 0.2 mg/dL (ref 0.2–1.2)
Total Bilirubin: 0.3 mg/dL (ref 0.2–1.2)
Total Protein: 6.9 g/dL (ref 6.1–8.1)

## 2024-06-16 MED ORDER — TERBINAFINE HCL 250 MG PO TABS: 250.0000 mg | ORAL_TABLET | Freq: Every day | ORAL | 0 refills | Status: AC

## 2024-06-16 NOTE — Progress Notes (Signed)
 Reviewed and agree with Dr Macky Lower plan.

## 2024-06-16 NOTE — Progress Notes (Signed)
   Subjective:    HPI Presents for follow-up onychomycosis treatment with p.o. Lamisil.  No problems taking medicine with no side effects noted patient presents for complaint of painful nails 1 through 5 bilaterally hurt with walking and wearing shoes.  Gets throbbing in the toes at times..  Tenderness around toes with walking and wearing shoes.  Type 2 diabetes   Objective:  Physical Exam   General: AAO x3, NAD  Vascular: DP and PT pulses palpable bilaterally.  Immedate capillary fill time digits.  Mild to moderate lower extremity edema bilaterally.  Dermatological: Onychomycotic mycotic changes nails 1 through 5 with discoloration nail and subungual debris and thickening of the nail.Tenderness with walking and wearing shoes.  Tenderness palpation of the nail plates.  Neruologic: Grossly intact B/L  Musculoskeletal:   Assessment:  Painful onychomycotic nails 1 through 5 bilaterally. Pain feet b/l     Plan:  -Established office visit level 3 for evaluation and management - Discussed treatment options for onychomycosis.  Will get her started on Lamisil.  Discussed topicals also she would like to pursue treatment with Lamisil.  No history of any renal or hepatic disease.  Discussed proper risk of liver side effects with Lamisil.  Will monitor blood LFTs every 4 weeks while taking it -Rx: Lamisil 250 mg p.o. daily - Labs ordered today for liver function tests to monitor for any hepatic side effects from the Lamisil.  - Return in 4 weeks for  Lamisil 2

## 2024-06-16 NOTE — Addendum Note (Signed)
 Addended by: GERRIT ANDREZ CROME on: 06/16/2024 01:00 PM   Modules accepted: Orders

## 2024-06-19 ENCOUNTER — Telehealth: Payer: Self-pay

## 2024-06-19 NOTE — Telephone Encounter (Signed)
 Pharmacy Patient Advocate Encounter  Received notification from [facility] that Prior Authorization for FreeStyle Libre 3 Plus Sensor has been APPROVED from 06/19/24 to 12/16/24   PA #/Case ID/Reference #: [id number]

## 2024-06-19 NOTE — Telephone Encounter (Signed)
 Pharmacy Patient Advocate Encounter   Received notification from CoverMyMeds that prior authorization for FreeStyle Libre 3 Plus Sensor is required/requested.   Insurance verification completed.   The patient is insured through [facility] .   PA required; PA submitted to above mentioned insurance via CoverMyMeds Key/confirmation #/EOC [id number]. Status is pending

## 2024-06-23 ENCOUNTER — Encounter (INDEPENDENT_AMBULATORY_CARE_PROVIDER_SITE_OTHER): Payer: Self-pay

## 2024-06-26 ENCOUNTER — Other Ambulatory Visit

## 2024-06-26 ENCOUNTER — Encounter

## 2024-06-28 ENCOUNTER — Encounter

## 2024-06-28 ENCOUNTER — Other Ambulatory Visit

## 2024-07-03 ENCOUNTER — Other Ambulatory Visit: Payer: Self-pay

## 2024-07-03 DIAGNOSIS — I10 Essential (primary) hypertension: Secondary | ICD-10-CM

## 2024-07-03 MED ORDER — AMLODIPINE-OLMESARTAN 10-20 MG PO TABS: 1.0000 | ORAL_TABLET | Freq: Every day | ORAL | 0 refills | Status: DC

## 2024-07-04 ENCOUNTER — Encounter: Payer: Self-pay | Admitting: Pharmacist

## 2024-07-04 ENCOUNTER — Ambulatory Visit (INDEPENDENT_AMBULATORY_CARE_PROVIDER_SITE_OTHER): Admitting: Pharmacist

## 2024-07-04 VITALS — BP 131/71 | HR 92 | Wt 238.7 lb

## 2024-07-04 DIAGNOSIS — Z794 Long term (current) use of insulin: Secondary | ICD-10-CM | POA: Diagnosis not present

## 2024-07-04 DIAGNOSIS — F17200 Nicotine dependence, unspecified, uncomplicated: Secondary | ICD-10-CM

## 2024-07-04 DIAGNOSIS — I10 Essential (primary) hypertension: Secondary | ICD-10-CM

## 2024-07-04 DIAGNOSIS — E119 Type 2 diabetes mellitus without complications: Secondary | ICD-10-CM | POA: Diagnosis not present

## 2024-07-04 MED ORDER — AMLODIPINE-OLMESARTAN 10-20 MG PO TABS: 1.0000 | ORAL_TABLET | Freq: Every day | ORAL | 0 refills | Status: DC

## 2024-07-04 MED ORDER — TRULICITY 1.5 MG/0.5ML ~~LOC~~ SOAJ: 1.5000 mg | SUBCUTANEOUS | 2 refills | Status: DC

## 2024-07-04 MED ORDER — INSULIN GLARGINE 100 UNIT/ML SOLOSTAR PEN: 5.0000 [IU] | PEN_INJECTOR | SUBCUTANEOUS | Status: DC

## 2024-07-04 MED ORDER — METFORMIN HCL 1000 MG PO TABS: 1000.0000 mg | ORAL_TABLET | Freq: Two times a day (BID) | ORAL | 3 refills | Status: AC

## 2024-07-04 NOTE — Patient Instructions (Signed)
 It was nice to see you today! Keep up the good work with your diet and monitoring your portions. Keep up the good work with decreasing the number of cigarettes you smoke per day, your goal is less than 6 cigarettes/day.  Your goal blood sugar is 80-130 before eating and less than 180 after eating.  Medication Changes: Decrease Lantus  (insulin  glargine) 5 units daily in the morning on the day you start the increased dose of Trulicity .  Increase Trulicity  (dulaglutide ) from 0.75 mg to 1.5 mg once every week. Use home supply of 0.75 mg pens until you run out, then pick up the 1.5 mg dose from your pharmacy.  Continue all other medication the same.   Keep up the good work with diet and exercise. Aim for a diet full of vegetables, fruit and lean meats (chicken, [place], fish). Try to limit salt intake by eating fresh or frozen vegetables (instead of canned), rinse canned vegetables prior to cooking and do not add any additional salt to meals.

## 2024-07-04 NOTE — Assessment & Plan Note (Signed)
 Diabetes longstanding 08/15/14 currently improving. Patient is able to verbalize appropriate hypoglycemia management plan. Medication adherence appears good.  -Decrease dose of basal insulin  Lantus  (insulin  glargine) from 10 units daily in the morning to 5 units daily in the morning. -Increase dose of GLP-1 Trulicity  (dulaglutide ) from 0.75 mg to 1.5 mg weekly. Plan to use home supply of 0.75 mg pens until gone. -Continued metformin  1000 mg two times daily.  -Patient educated on purpose, proper use, and potential adverse effects. -Extensively discussed pathophysiology of diabetes, recommended lifestyle interventions, dietary effects on blood sugar control.  -Counseled on s/sx of and management of hypoglycemia.

## 2024-07-04 NOTE — Progress Notes (Signed)
 S:     Chief Complaint  Patient presents with   Medication Management    Libre sensor   58 y.o. female who presents for diabetes evaluation, education, and management. Patient arrives in good spirits and presents without any assistance.  Patient was referred and last seen by Primary Care Provider, Dr. Lupie, on 06/12/24.  At last visit, A1c was 9.8.   PMH is significant for Diabetes, hypertension, and obesity.  Patient reports Diabetes was diagnosed in 08/15/14.    Current diabetes medications include:  Trulicity  (dulaglutide ) 0.75 mg/0.5 ml inject 0.75 mg as directed once a week. Lantus  (insulin  glargine) 10 units into the skin every morning Metformin  1000 mg take 1 tablet by mouth 2 times a daily  Current hypertension medications include:  Amlodipine -Olmesartan  10-20 mg take 1 tablet by mouth daily   Current hyperlipidemia medications include:  Rosuvastatin  10 mg take 1 tablet by mouth daily  Patient reports adherence to taking all medications as prescribed.   Do you feel that your medications are working for you? yes Have you been experiencing any side effects to the medications prescribed? no Do you have any problems obtaining medications due to transportation or finances? no Insurance coverage: Medicaid  Patient denies hypoglycemic events.     Patient reported dietary habits: Eating more vegetables, less cheese in macaroni. Protein source of barbeque ribs. Patient reports monitoring portion sizes.  O:   Review of Systems  All other systems reviewed and are negative.   Physical Exam Constitutional:      Appearance: Normal appearance.  Pulmonary:     Effort: Pulmonary effort is normal.  Neurological:     Mental Status: She is alert.  Psychiatric:        Mood and Affect: Mood normal.        Behavior: Behavior normal.        Thought Content: Thought content normal.        Judgment: Judgment normal.     Libre3 CGM Download today 07/04/24 % Time CGM is  active: 90% Average Glucose: 120 mg/dL Glucose Management Indicator: 6.2  Glucose Variability: 25.5% (goal <36%) Time in Goal:  - Time in range 70-180: 94% - Time above range: 5% - Time below range: 1%  Lab Results  Component Value Date   HGBA1C 9.8 (A) 06/12/2024   Vitals:   07/04/24 1133  BP: 131/71  Pulse: 92  SpO2: 100%    Lipid Panel     Component Value Date/Time   CHOL 137 11/12/2020 1412   TRIG 144 11/12/2020 1412   HDL 50 11/12/2020 1412   CHOLHDL 2.7 11/12/2020 1412   CHOLHDL 5.1 08/11/2014 0005   VLDL 32 08/11/2014 0005   LDLCALC 62 11/12/2020 1412    Clinical Atherosclerotic Cardiovascular Disease (ASCVD): No   Patient is participating in a Managed Medicaid Plan:  Yes   A/P: Diabetes longstanding 08/15/14 currently improving. Patient is able to verbalize appropriate hypoglycemia management plan. Medication adherence appears good.  -Decrease dose of basal insulin  Lantus  (insulin  glargine) from 10 units daily in the morning to 5 units daily in the morning. -Increase dose of GLP-1 Trulicity  (dulaglutide ) from 0.75 mg to 1.5 mg weekly. Plan to use home supply of 0.75 mg pens until gone. -Continued metformin  1000 mg two times daily.  -Patient educated on purpose, proper use, and potential adverse effects. -Extensively discussed pathophysiology of diabetes, recommended lifestyle interventions, dietary effects on blood sugar control.  -Counseled on s/sx of and management of hypoglycemia.  Tobacco use disorder, previous history of greater than 1 ppd, now down to about 8 cigarettes a day. Willing to try to get to 6 cigarettes a day by the next time we meet towards quitting.   Written patient instructions provided. Patient verbalized understanding of treatment plan.  Total time in face to face counseling 20 minutes.    Follow-up:  Pharmacist 08/08/24 Patient seen with Reynalda Blase, PharmD Candidate - PY3 student and Calton Nash, PharmD Candidate - PY4  student.

## 2024-07-04 NOTE — Assessment & Plan Note (Signed)
 Tobacco use disorder, previous history of greater than 1 ppd, now down to about 8 cigarettes a day. Willing to try to get to 6 cigarettes a day by the next time we meet towards quitting.

## 2024-07-06 ENCOUNTER — Ambulatory Visit
Admission: RE | Admit: 2024-07-06 | Discharge: 2024-07-06 | Disposition: A | Discharge status: Home / Self Care | Source: Ambulatory Visit | Attending: Family Medicine | Admitting: Family Medicine

## 2024-07-06 ENCOUNTER — Ambulatory Visit

## 2024-07-06 ENCOUNTER — Ambulatory Visit: Payer: Self-pay

## 2024-07-06 DIAGNOSIS — R92323 Mammographic fibroglandular density, bilateral breasts: Secondary | ICD-10-CM | POA: Diagnosis not present

## 2024-07-06 DIAGNOSIS — R928 Other abnormal and inconclusive findings on diagnostic imaging of breast: Secondary | ICD-10-CM | POA: Diagnosis not present

## 2024-07-06 DIAGNOSIS — N6320 Unspecified lump in the left breast, unspecified quadrant: Secondary | ICD-10-CM

## 2024-07-14 ENCOUNTER — Other Ambulatory Visit: Payer: Self-pay | Admitting: Podiatry

## 2024-07-14 DIAGNOSIS — M79671 Pain in right foot: Secondary | ICD-10-CM

## 2024-07-14 DIAGNOSIS — B351 Tinea unguium: Secondary | ICD-10-CM

## 2024-08-01 ENCOUNTER — Other Ambulatory Visit: Payer: Self-pay | Admitting: Family Medicine

## 2024-08-01 DIAGNOSIS — I10 Essential (primary) hypertension: Secondary | ICD-10-CM

## 2024-08-08 ENCOUNTER — Encounter: Payer: Self-pay | Admitting: Pharmacist

## 2024-08-08 ENCOUNTER — Ambulatory Visit: Admitting: Pharmacist

## 2024-08-08 VITALS — BP 128/68 | HR 93 | Wt 236.0 lb

## 2024-08-08 DIAGNOSIS — E119 Type 2 diabetes mellitus without complications: Secondary | ICD-10-CM | POA: Diagnosis not present

## 2024-08-08 DIAGNOSIS — F17200 Nicotine dependence, unspecified, uncomplicated: Secondary | ICD-10-CM

## 2024-08-08 DIAGNOSIS — I10 Essential (primary) hypertension: Secondary | ICD-10-CM | POA: Diagnosis not present

## 2024-08-08 DIAGNOSIS — Z794 Long term (current) use of insulin: Secondary | ICD-10-CM | POA: Diagnosis not present

## 2024-08-08 MED ORDER — AMLODIPINE-OLMESARTAN 10-20 MG PO TABS
1.0000 | ORAL_TABLET | Freq: Every day | ORAL | 1 refills | Status: AC
Start: 2024-08-08 — End: ?

## 2024-08-08 MED ORDER — TRULICITY 1.5 MG/0.5ML ~~LOC~~ SOAJ
1.5000 mg | SUBCUTANEOUS | 2 refills | Status: DC
Start: 2024-08-08 — End: 2024-08-14

## 2024-08-08 MED ORDER — INSULIN GLARGINE 100 UNIT/ML SOLOSTAR PEN
10.0000 [IU] | PEN_INJECTOR | SUBCUTANEOUS | Status: DC
Start: 2024-08-08 — End: 2024-08-14

## 2024-08-08 NOTE — Assessment & Plan Note (Signed)
 Tobacco use disorder with moderate nicotine  dependence of many years duration in a patient who is fair candidate for success because of self motivation to quit smoking.   - Currently smoking 6 cigarettes/day, goal of using last 2 cigarettes and not buying anymore - Discussed ways to cut back cigarettes - playing games on phone, walking dogs & going to dog park

## 2024-08-08 NOTE — Assessment & Plan Note (Signed)
 Hypertension longstanding currently controlled. Blood pressure goal of <130/80  mmHg. Medication adherence good. -Continued amlodipine -olmesartan  10-20 mg daily

## 2024-08-08 NOTE — Assessment & Plan Note (Addendum)
 Diabetes longstanding currently uncontrolled. Patient is able to verbalize appropriate hypoglycemia management plan. Medication adherence appears okay, not able to consistently take Trulicity  (dulaglutide ). Control is suboptimal due to medication nonadherence. -Continued Lantus  (insulin  glargine) 10 units daily, metformin  1000 mg BID -Continued Trulicity  (dulaglutide ) 0.75 mg 2 pens once weekly to finish supply on hand, then transition to 1.5 mg 1 pen once weekly  -Discussed adherence to Trulicity  (dulaglutide ) and the benefit of setting a reminder to take it.  -Consider increase of Trulicity  (dulaglutide ) to 3 mg weekly at next appointment  ASCVD risk - primary prevention in patient with diabetes.  -Continued rosuvastatin  10 mg daily

## 2024-08-08 NOTE — Progress Notes (Signed)
 S:     Chief Complaint  Patient presents with   Medication Management    T2DM & Tobacco Use   58 y.o. female who presents for diabetes evaluation, education, and management. Patient arrives in good spirits and presents without any assistance. Reports that she had issues with CGM sticking, but has found an area on her arm that it may stick better.   Uses about 6 cigarettes daily, but when she gets mad will smoke ~ 10/day. Brand of choice is Newport 100. Currently has 2 cigarettes on hand, and is interested in quitting.  - Smokes first cigarette after eating lunch, with other cigarettes while cooking and after eating dinner, as well as watching TV at night.   Patient was referred and last seen by Primary Care Provider, Dr. Lupie, on 06/12/2024.  At last visit, restarted Trulicity  (dulaglutide ) at 0.75 mg.   PMH is significant for DM, hypertension, obesity.  Patient reports Diabetes was diagnosed in 2015.   Family/Social History: family history of DM  Current diabetes medications include: Lantus  (insulin  glargine) 10 units daily, metformin  1000 mg BID, Trulicity  (dulaglutide ) 1.5 mg weekly  Current hypertension medications include: amlodipine -olmesartan  10-20 mg daily Current hyperlipidemia medications include: rosuvastatin  10 mg daily  Reports missing her Trulicity  (dulaglutide ) occasionally. Reports using 10 units of Lantus  (insulin  glargine) when she thinks she is going to have a big meal, if she is not going to have a big meal she does not use any insulin .   Do you feel that your medications are working for you? yes Have you been experiencing any side effects to the medications prescribed? no Insurance coverage: [place] Medicaid  Patient denies hypoglycemic events.  Patient denies nocturia (nighttime urination).  Patient reports neuropathy (nerve pain).  Patient reports visual changes. - Working on getting to an Surveyor, mining currently.  Patient reports self foot exams. - Saw  podiatrist in July who prescribed terbinafine  and stated that her toenails were black    Patient reported dietary habits: Eats 2-3 meals/day Dinner: vegetables (steamed with butter), baked chicken/fish  Snacks: fruits Drinks: crystal light (sugar free), zero sugar sodas   Patient-reported exercise habits: sweeps and mops the floor. Reports she broke her back last November which limits her exercise  O:   Review of Systems  All other systems reviewed and are negative.   Physical Exam Vitals reviewed.  Constitutional:      Appearance: Normal appearance.  Pulmonary:     Effort: Pulmonary effort is normal.  Neurological:     Mental Status: She is alert.  Psychiatric:        Mood and Affect: Mood normal.        Behavior: Behavior normal.        Thought Content: Thought content normal.        Judgment: Judgment normal.    Libre3 CGM Download today  % Time CGM is active: 20% Average Glucose: 116 mg/dL Glucose Management Indicator: N/A  Glucose Variability: 22.6% (goal <36%) Time in Goal:  - Time in range 70-180: 96% - Time above range: 4% - Time below range: 0% Observed patterns:  Lab Results  Component Value Date   HGBA1C 9.8 (A) 06/12/2024   Vitals:   08/08/24 1105  BP: 128/68  Pulse: 93  SpO2: 96%    Lipid Panel     Component Value Date/Time   CHOL 137 11/12/2020 1412   TRIG 144 11/12/2020 1412   HDL 50 11/12/2020 1412   CHOLHDL 2.7 11/12/2020 1412  CHOLHDL 5.1 08/11/2014 0005   VLDL 32 08/11/2014 0005   LDLCALC 62 11/12/2020 1412    Patient is participating in a Managed Medicaid Plan:  Yes   A/P: Diabetes longstanding currently uncontrolled. Patient is able to verbalize appropriate hypoglycemia management plan. Medication adherence appears okay, not able to consistently take Trulicity  (dulaglutide ). Control is suboptimal due to medication nonadherence. -Continued Lantus  (insulin  glargine) 10 units daily, metformin  1000 mg BID -Continued Trulicity   (dulaglutide ) 0.75 mg 2 pens once weekly to finish supply on hand, then transition to 1.5 mg 1 pen once weekly  -Discussed adherence to Trulicity  (dulaglutide ) and the benefit of setting a reminder to take it.  -Consider increase of Trulicity  (dulaglutide ) to 3 mg weekly at next appointment -Patient educated on purpose, proper use, and potential adverse effects.  -Extensively discussed pathophysiology of diabetes, recommended lifestyle interventions, dietary effects on blood sugar control.  -Counseled on s/sx of and management of hypoglycemia.   ASCVD risk - primary prevention in patient with diabetes.  -Continued rosuvastatin  10 mg daily   Hypertension longstanding currently controlled. Blood pressure goal of <130/80  mmHg. Medication adherence good. - -Continued amlodipine -olmesartan  10-20 mg daily  Tobacco use disorder with moderate nicotine  dependence of many years duration in a patient who is fair candidate for success because of self motivation to quit smoking.   - Currently smoking 6 cigarettes/day, goal of using last 2 cigarettes and not buying anymore - Discussed ways to cut back cigarettes - playing games on phone, walking dogs & going to dog park  Written patient instructions provided. Patient verbalized understanding of treatment plan.  Total time in face to face counseling 41 minutes.    Follow-up:  Pharmacist 08/29/24 Patient seen with Estefana Blase, PharmD Candidate - PY4 student.

## 2024-08-08 NOTE — Patient Instructions (Addendum)
 It was nice to see you today!  Your goal blood sugar is 80-130 before eating and less than 180 after eating.  Medication Changes: Finish using your 0.75 mg Trulicity  (dulaglutide ) pens (2 pens at a time). Then pick up 1.5 mg Trulicity  (dulaglutide ) pens from pharmacy and use 1 pen.    Continue all other medication the same.   Try calling your foot doctor to see if they can follow-up with your feet.   Monitor blood sugars at home and keep a log (glucometer or piece of paper) to bring with you to your next visit.  Keep up the good work with diet and exercise. Aim for a diet full of vegetables, fruit and lean meats (chicken, [place], fish). Try to limit salt intake by eating fresh or frozen vegetables (instead of canned), rinse canned vegetables prior to cooking and do not add any additional salt to meals.     Tobacco Patient Instructions  Quitting smoking is one of the most important decisions you can make for your current and future health. Consider what you dislike about smoking and how quitting could personally benefit you. Try to cut down.   1-800-QUIT NOW  Starting today, Be a Quitter!  Remind yourself why you want to quit.  Delay your first cigarette of the day for as long as possible.  Start cleaning out all pockets, drawers, and your car of cigarettes.  Getting Through the Cravings Once You Are Smoke Free: Each craving will last about 10 minutes, whether or not you smoke. Here's how to get through the cravings without cigarettes:  DELAY: Tell yourself that you'll wait for the next craving. Do it every time! DEEP BREATHS: One reason smoking feels good is because you breathe in deeply to inhale. Take four slow, deep breaths and feel the relaxation without the hamful effects of cigarettes. DRINK WATER: Drink a glass of cool water. It will give your hands and mouth something to do and will help flush the nicotine  out of your system faster. DIVERT: Do something else -- brush your  teeth, take a walk, call a friend who can offer you support. Just moving onto something other than thinking about cigarettes will move you through the craving.   Frequently Asked Questions  What can I do when I get the urge to smoke? To get through the urge to smoke, try the following:  Review your reasons for quitting and think of all the benefits to your health, your finances, and your family.  Remind yourself that there is no such thing as just one cigarette -- or even one puff.  Ride out the desire to smoke. Use the 4 Os -- Delay, Deep Breaths, Drink Water and Divert to get you through. The craving will go away eventually. Do not fool yourself into thinking you can have just one cigarette.  Any tips on how to deal with stress? Stress is a natural part of life. The key is to deal with it without reaching for a cigarette. Taking deep breaths, counting backwards from 10 and asking yourself 1-how big a deal is this?"  Writing down your feelings, talking with a friend and doing things like positive self-talk and meditation are some other ways that people deal with daily stress.  What if I start smoking again? Slips happen. Most people try to quit smoking a few times before they are successful. Don't beat yourself up if this happens to you! Ask yourself if this was a slip or a relapse. A slip  is a one-time mistake that is quickly corrected. A relapse is going back to your old smoking habits.   If you slip, don't give up. Think of it as a learning experience. Ask yourself what went wrong and renew your commitment to staying away from smoking for good.  If you relapse, try not to get discouraged. Ask yourself the question "What caused me to start smoking?" Figure out what helped you and what didn't when you tried to quit. Knowing why you relapsed is useful information for your next attempt to quit.  Please bring all medications to your clinic visits.  Please arrive 10-15 minutes prior to your  scheduled visit time.

## 2024-08-11 NOTE — Progress Notes (Signed)
 Reviewed and agree with Dr Rennis plan.

## 2024-08-13 ENCOUNTER — Other Ambulatory Visit: Payer: Self-pay | Admitting: Gastroenterology

## 2024-08-14 ENCOUNTER — Other Ambulatory Visit: Payer: Self-pay

## 2024-08-14 DIAGNOSIS — E119 Type 2 diabetes mellitus without complications: Secondary | ICD-10-CM

## 2024-08-14 MED ORDER — INSULIN GLARGINE 100 UNIT/ML SOLOSTAR PEN: 10.0000 [IU] | PEN_INJECTOR | SUBCUTANEOUS | Status: AC

## 2024-08-14 MED ORDER — TRULICITY 1.5 MG/0.5ML ~~LOC~~ SOAJ: 1.5000 mg | SUBCUTANEOUS | 2 refills | Status: DC

## 2024-08-14 MED ORDER — ROSUVASTATIN CALCIUM 10 MG PO TABS: 10.0000 mg | ORAL_TABLET | Freq: Every day | ORAL | 3 refills | Status: DC

## 2024-08-25 ENCOUNTER — Other Ambulatory Visit: Payer: Self-pay

## 2024-08-25 DIAGNOSIS — E119 Type 2 diabetes mellitus without complications: Secondary | ICD-10-CM

## 2024-08-29 ENCOUNTER — Ambulatory Visit: Admitting: Pharmacist

## 2024-09-14 ENCOUNTER — Encounter: Payer: Self-pay | Admitting: Pharmacist

## 2024-09-29 ENCOUNTER — Other Ambulatory Visit (INDEPENDENT_AMBULATORY_CARE_PROVIDER_SITE_OTHER): Payer: Self-pay

## 2024-09-29 DIAGNOSIS — H9190 Unspecified hearing loss, unspecified ear: Secondary | ICD-10-CM

## 2024-10-04 ENCOUNTER — Ambulatory Visit (INDEPENDENT_AMBULATORY_CARE_PROVIDER_SITE_OTHER)

## 2024-10-04 ENCOUNTER — Ambulatory Visit (INDEPENDENT_AMBULATORY_CARE_PROVIDER_SITE_OTHER): Admitting: Audiology

## 2024-10-04 VITALS — BP 160/92 | HR 90 | Temp 97.9°F | Ht 69.0 in | Wt 233.0 lb

## 2024-10-04 DIAGNOSIS — H9313 Tinnitus, bilateral: Secondary | ICD-10-CM

## 2024-10-04 DIAGNOSIS — R590 Localized enlarged lymph nodes: Secondary | ICD-10-CM | POA: Diagnosis not present

## 2024-10-04 DIAGNOSIS — H93292 Other abnormal auditory perceptions, left ear: Secondary | ICD-10-CM

## 2024-10-04 DIAGNOSIS — K118 Other diseases of salivary glands: Secondary | ICD-10-CM

## 2024-10-04 DIAGNOSIS — Z0110 Encounter for examination of ears and hearing without abnormal findings: Secondary | ICD-10-CM

## 2024-10-04 DIAGNOSIS — R600 Localized edema: Secondary | ICD-10-CM

## 2024-10-04 NOTE — Progress Notes (Addendum)
  [address], [address] [place], [place] [address] [phone]  Audiological Evaluation    Name: Melanie Phillips     DOB:   [birth year]      MRN:   [id number]                                                                                     Service Date: 10/04/2024     Accompanied by: unaccompanied   Patient comes today after Dr. Anice, ENT sent a referral for a hearing evaluation due to concerns with tinnitus.   Symptoms Yes Details  Hearing loss  []    Tinnitus  [x]  [place]ming, few months ago , left ear - comes and goes  Ear pain/ infections/pressure  []    Balance problems  []    Noise exposure history  [x]  music  Previous ear surgeries  []    Family history of hearing loss  [x]  Reports mother lost hearing because of her diabetes and she is diabetic too  Amplification  []    Other  []      Otoscopy: Right ear: Clear external ear canal and notable landmarks visualized on the tympanic membrane. Left ear:  Clear external ear canal and notable landmarks visualized on the tympanic membrane.  Tympanometry: Right ear: Normal external ear canal volume with normal middle ear pressure and tympanic membrane compliance (Type A). Findings are suggestive of normal middle ear function. Left ear: Normal external ear canal volume with normal middle ear pressure and tympanic membrane compliance (Type A). Findings are suggestive of normal middle ear function.   Hearing Evaluation The hearing test results were completed under headphones and results are deemed to be of good reliability. Test technique:  conventional    Pure tone Audiometry: Right ear- Normal to borderline normal hearing from 250 Hz - 8000 Hz. Left ear- Normal to borderline normal hearing from 250 Hz - 8000 Hz.  Speech Audiometry: Right ear- Speech Reception Threshold (SRT) was obtained at 10 dBHL. Left ear-Speech Reception Threshold (SRT) was obtained at 10 dBHL.   Word Recognition Score Tested using NU-6  (recorded) Right ear: 100% was obtained at a presentation level of 50 dBHL with contralateral masking which is deemed as  excellent. Left ear: 100% was obtained at a presentation level of 50 dBHL with contralateral masking which is deemed as  excellent.   Impression: There is not a significant difference in pure-tone thresholds between ears. There is not a significant difference in the word recognition score in between ears.    Recommendations: Follow up with ENT as scheduled for today. Return for a hearing evaluation if concerns with hearing changes arise or per MD recommendation.   Garrell Flagg MARIE LEROUX-MARTINEZ, AUD

## 2024-10-04 NOTE — Progress Notes (Deleted)
   SUBJECTIVE:   CHIEF COMPLAINT / HPI:  Melanie Phillips is a 58 y.o. female with a pertinent past medical history of *** presenting to the clinic for ***.  Knot under chin   PERTINENT PMH / PSH: ***  *Remainder reviewed in problem list.   OBJECTIVE:   LMP 06/23/2013  Physical Exam General: Age-appropriate, resting comfortably in chair, NAD, alert and at baseline. HEENT:  Head: Normocephalic, atraumatic. No tenderness to percussion over sinuses. Eyes: PERRLA. No conjunctival erythema or scleral injections. Ears: TMs non-bulging and non-erythematous bilaterally. No erythema of external ear canal. No cerumen impaction. Nose: Non-erythematous turbinates. No rhinorrhea. Mouth/Oral: Clear, no tonsillar exudate. MMM. Neck: Supple. No LAD. Cardiovascular: Regular rate and rhythm. Normal S1/S2. No murmurs, rubs, or gallops appreciated. 2+ radial pulses. Pulmonary: Clear bilaterally to ascultation. No wheezes, crackles, or rhonchi. Normal WOB on room air. No accessory muscle use. Abdominal: No tenderness to deep or light palpation. No rebound or guarding. No HSM. Skin: Warm and dry. Extremities: No peripheral edema bilaterally. Capillary refill <2 seconds.  No results found for this or any previous visit (from the past 48 hours).     06/12/2024   11:20 AM  Depression screen PHQ 2/9  Decreased Interest 2  Down, Depressed, Hopeless 2  PHQ - 2 Score 4  Altered sleeping 2  Tired, decreased energy 3  Change in appetite 1  Feeling bad or failure about yourself  1  Trouble concentrating 0  Moving slowly or fidgety/restless 0  Suicidal thoughts 0  PHQ-9 Score 11     ASSESSMENT/PLAN:   Assessment & Plan   No follow-ups on file.  Riyana Biel Toma, MD [facility]

## 2024-10-04 NOTE — Progress Notes (Signed)
 Dear Dr. Adele, Here is my assessment for our mutual patient, Melanie Phillips. Thank you for allowing me the opportunity to care for your patient. Please do not hesitate to contact me should you have any other questions. Sincerely, Dr. Penne Croak  Otolaryngology Clinic Note Referring provider: Dr. Adele HPI:  Discussed the use of AI scribe software for clinical note transcription with the patient, who gave verbal consent to proceed.  History of Present Illness Melanie Phillips is a 58 year old female who presents with a tingling sensation and roaring sound in her ear.  Aural paresthesia and tinnitus - Tingling sensation in the ear for approximately five months - Tingling radiates posteriorly into the head - Roaring sound in the ear, intermittent in nature - Roaring sound varies with head position, especially when looking down and to the right - No hearing loss; hearing test was normal - No throat pain or voice changes  Cervical swelling - Swelling in the neck first noticed while washing her face - Uncertain duration of swelling - Swelling appears to have decreased in size since initial detection  Tobacco use - Smokes less than one pack of cigarettes per day - Smoking duration is two years  Oral prosthetics - Dentures present on both upper and lower arches   Independent Review of Additional Tests or Records:  Reviewed external note from referring PCP, Hindel,describing relevant history incorporated into today's evaluation. Interpreted and reviewed audiogram. Hearing WNL AU. Word discrimination 100% AU. Type A tymp AU.     PMH/Meds/All/SocHx/FamHx/ROS:   Past Medical History:  Diagnosis Date   Arthritis    Back pain    Diabetes mellitus without complication (HCC)    GERD (gastroesophageal reflux disease)    Hyperlipidemia    Hypertension    Trigger finger of right hand    RMF, RRF     Past Surgical History:  Procedure Laterality Date   CARPAL TUNNEL RELEASE  Right 03/10/2017   Procedure: CARPAL TUNNEL RELEASE;  Surgeon: Jerri Kay HERO, MD;  Location: [facility];  Service: Orthopedics;  Laterality: Right;   CARPOMETACARPEL SUSPENSION PLASTY Right 03/10/2017   Procedure: Right thumb Ligament Reconstruction Tendon Interposition, Right Carpal tunnel release;  Surgeon: Jerri Kay HERO, MD;  Location: [facility];  Service: Orthopedics;  Laterality: Right;   JOINT REPLACEMENT     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   Procedure: RIGHT TOTAL KNEE ARTHROPLASTY;  Surgeon: Jerri Kay HERO, MD;  Location: [facility];  Service: Orthopedics;  Laterality: Right;   TRIGGER FINGER RELEASE Left 05/26/2017   Procedure: RELEASE TRIGGER FINGER LEFT THUMB;  Surgeon: Jerri Kay HERO, MD;  Location: [facility];  Service: Orthopedics;  Laterality: Left;   TRIGGER FINGER RELEASE Right 02/23/2018   Procedure: RELEASE RIGHT TRIGGER THUMB;  Surgeon: Jerri Kay HERO, MD;  Location: [facility];  Service: Orthopedics;  Laterality: Right;  Bier block   TRIGGER FINGER RELEASE Right 05/06/2022   Procedure: RIGHT MIDDLE & RING TRIGGER FINGER RELEASE;  Surgeon: Jerri Kay HERO, MD;  Location: [facility];  Service: Orthopedics;  Laterality: Right;   TUBAL LIGATION      Family History  Problem Relation Age of Onset   Diabetes Mother    Hypertension Mother    Heart failure Mother    Heart attack Father    Diabetes Sister    Dementia Brother    Hypertension Brother    Diabetes Brother    Hypertension Daughter  Sleep apnea Daughter    Colon cancer Neg Hx    Colon polyps Neg Hx    Heart disease Neg Hx    Rectal cancer Neg Hx    Stomach cancer Neg Hx      Social Connections: Unknown (04/14/2022)   Received from [facility]   Social Network    Social Network: Not on file      Current Outpatient Medications:    Accu-Chek FastClix Lancets MISC, Use to check blood sugar 3x per day. E11.9, Disp: 100 each, Rfl: 12    amlodipine -olmesartan  (AZOR ) 10-20 MG tablet, Take 1 tablet by mouth daily., Disp: 90 tablet, Rfl: 1   aspirin  EC 81 MG tablet, Take 1 tablet (81 mg total) by mouth daily., Disp: 30 tablet, Rfl: 3   Blood Glucose Monitoring Suppl (ACCU-CHEK GUIDE) w/Device KIT, 1 Device by Does not apply route in the morning and at bedtime., Disp: 1 kit, Rfl: 0   Continuous Glucose Sensor (FREESTYLE LIBRE 3 PLUS SENSOR) MISC, Change sensor every 15 days., Disp: 2 each, Rfl: 11   Dulaglutide  (TRULICITY ) 1.5 MG/0.5ML SOAJ, Inject 1.5 mg into the skin once a week., Disp: 2 mL, Rfl: 2   glucose blood (ACCU-CHEK GUIDE TEST) test strip, Use to check blood sugar 3x per day. E11.9, Disp: 100 each, Rfl: 12   insulin  glargine (LANTUS ) 100 UNIT/ML Solostar Pen, Inject 10 Units into the skin every morning., Disp: , Rfl:    Insulin  Pen Needle (BD PEN NEEDLE NANO 2ND GEN) 32G X 4 MM MISC, 1 Units by Does not apply route 4 (four) times daily -  before meals and at bedtime., Disp: 100 each, Rfl: 2   metFORMIN  (GLUCOPHAGE ) 1000 MG tablet, Take 1 tablet (1,000 mg total) by mouth 2 (two) times daily., Disp: 180 tablet, Rfl: 3   pantoprazole  (PROTONIX ) 40 MG tablet, TAKE 1 TABLET BY MOUTH TWICE A DAY, Disp: 180 tablet, Rfl: 1   rosuvastatin  (CRESTOR ) 10 MG tablet, Take 1 tablet (10 mg total) by mouth daily., Disp: 90 tablet, Rfl: 3   terbinafine  (LAMISIL ) 250 MG tablet, Take 1 tablet (250 mg total) by mouth daily. (Patient not taking: Reported on 10/04/2024), Disp: 30 tablet, Rfl: 0   Physical Exam:   BP (!) 160/92 (BP Location: Right Arm, Patient Position: Sitting, Cuff Size: Normal)   Pulse 90   Temp 97.9 F (36.6 C) (Oral)   Ht 5' 9 (1.753 m)   Wt 233 lb (105.7 kg)   LMP 06/23/2013   SpO2 92%   BMI 34.41 kg/m   The patient was awake, alert, and appropriate. The external ears were inspected, and otoscopy was performed to evaluate the external auditory canals and tympanic membranes. The nasal cavity and septum were examined  for mucosal changes, obstruction, or discharge. The oral cavity and oropharynx were inspected for mucosal lesions, infection, or tonsillar hypertrophy. The neck was palpated for lymphadenopathy, thyroid abnormalities, or other masses. Cranial nerve function was grossly intact.  Pertinent Findings: Physical Exam HEENT: Normal oropharynx NECK: Neck supple, no masses, left submandibular gland swollen with tenderness to palpation   Seprately Identifiable Procedures:  I personally ordered, reviewed and interpreted the following with the patient today  Procedure: Bilateral ear microscopy using microscope (CPT 92504) Pre-procedure diagnosis: tinnitus Post-procedure diagnosis: same Indication: see above; given patient's otologic complaints and history, for improved and comprehensive examination of external ear and tympanic membrane, bilateral otologic examination using microscope was performed. Prior to proceeding, verbal consent was obtained after discussion of  R/B/A  Procedure: Patient was placed semi-recumbent. Both ear canals were examined using the microscope with findings below. Patient tolerated the procedure well.  Right ear:  No significant lesions pinna. EAC: no significant lesions. Canal is clear. Eczematoid changes. minimal TM: Intact   Left ear:  No significant lesions pinna. EAC: no significant lesions. Canal is clear. Eczematoid changes. minimal TM: Intact    Impression & Plans:  Melanie Phillips is a 58 y.o. female  1. Submandibular gland tenderness   2. Submandibular gland swelling   3. Tinnitus of both ears    - Findings and diagnoses discussed in detail with the patient. - Risks, benefits, and alternatives were reviewed. Through shared decision making, the patient elects to proceed with below. Assessment & Plan Left submandibular gland swelling Swelling of the left submandibular gland with possible stone or duct obstruction. - Order CT scan of the neck to evaluate the  left submandibular gland. - Advise massaging the area from the ear to the chin. - Recommend applying warm packs to the area. - Suggest using lemon wedges to stimulate saliva production and potentially flush out any obstruction.  Tinnitus with head movement Intermittent roaring sound in the right ear with head movement. Normal hearing test. Etiology unclear. - Order CT scan to investigate the cause of tinnitus, possible vessel anomaly.   - Orders placed:  Orders Placed This Encounter  Procedures   CT SOFT TISSUE NECK W CONTRAST   - Medications prescribed/continued/adjusted: No orders of the defined types were placed in this encounter.  - Education materials provided to the patient. - Follow up: 6 weeks. Assess for vessel anomaly. Discuss further workup such as MRI. Patient instructed to return sooner or go to the ED if new/worsening symptoms develop.   Thank you for allowing me the opportunity to care for your patient. Please do not hesitate to contact me should you have any other questions.  Sincerely, Penne Croak, DO Otolaryngologist (ENT) [facility] Phone: [phone] Fax: [phone]  10/04/2024, 4:07 PM

## 2024-10-05 ENCOUNTER — Ambulatory Visit: Admitting: Family Medicine

## 2024-10-11 ENCOUNTER — Encounter: Payer: Self-pay | Admitting: Audiology

## 2024-10-30 ENCOUNTER — Telehealth (INDEPENDENT_AMBULATORY_CARE_PROVIDER_SITE_OTHER): Payer: Self-pay

## 2024-10-30 NOTE — Telephone Encounter (Signed)
 I left a voicemail for the patient to call [facility] to schedule the neck CT, then to call to schedule his appointment with Dr. Anice.

## 2024-11-07 ENCOUNTER — Ambulatory Visit (INDEPENDENT_AMBULATORY_CARE_PROVIDER_SITE_OTHER)

## 2024-11-21 ENCOUNTER — Ambulatory Visit (HOSPITAL_COMMUNITY)
Admission: RE | Admit: 2024-11-21 | Discharge: 2024-11-21 | Disposition: A | Discharge status: Home / Self Care | Source: Ambulatory Visit

## 2024-11-21 DIAGNOSIS — R221 Localized swelling, mass and lump, neck: Secondary | ICD-10-CM | POA: Diagnosis not present

## 2024-11-21 DIAGNOSIS — R600 Localized edema: Secondary | ICD-10-CM | POA: Insufficient documentation

## 2024-11-21 LAB — POCT I-STAT CREATININE: Creatinine, Ser: 0.8 mg/dL (ref 0.44–1.00)

## 2024-11-21 MED ORDER — IOHEXOL 300 MG/ML  SOLN
75.0000 mL | Freq: Once | INTRAMUSCULAR | Status: AC | PRN
  Administered 2024-11-21: 75 mL via INTRAVENOUS

## 2024-11-30 ENCOUNTER — Other Ambulatory Visit: Payer: Self-pay | Admitting: Gastroenterology

## 2024-12-05 ENCOUNTER — Encounter (INDEPENDENT_AMBULATORY_CARE_PROVIDER_SITE_OTHER): Payer: Self-pay

## 2024-12-05 ENCOUNTER — Ambulatory Visit (INDEPENDENT_AMBULATORY_CARE_PROVIDER_SITE_OTHER)

## 2024-12-05 VITALS — BP 145/84 | HR 97 | Wt 225.0 lb

## 2024-12-05 DIAGNOSIS — H9312 Tinnitus, left ear: Secondary | ICD-10-CM

## 2024-12-05 DIAGNOSIS — K1120 Sialoadenitis, unspecified: Secondary | ICD-10-CM

## 2024-12-05 NOTE — Progress Notes (Addendum)
 Dear Dr. Adele, Here is my assessment for our mutual patient, Melanie Phillips. Thank you for allowing me the opportunity to care for your patient. Please do not hesitate to contact me should you have any other questions. Sincerely, Dr. Penne Croak  Otolaryngology Clinic Note Referring provider: Dr. Adele HPI:  Discussed the use of AI scribe software for clinical note transcription with the patient, who gave verbal consent to proceed.  History of Present Illness Melanie Phillips is a 59 year old female who presents with a tingling sensation and roaring sound in her ear.  Aural paresthesia and tinnitus - Tingling sensation in the ear for approximately five months - Tingling radiates posteriorly into the head - Roaring sound in the ear, intermittent in nature - Roaring sound varies with head position, especially when looking down and to the right - No hearing loss; hearing test was normal - No throat pain or voice changes  Cervical swelling - Swelling in the neck first noticed while washing her face - Uncertain duration of swelling - Swelling appears to have decreased in size since initial detection  Tobacco use - Smokes less than one pack of cigarettes per day - Smoking duration is two years  Oral prosthetics - Dentures present on both upper and lower arches  12/05/24 Melanie Phillips is a 59 year old female with 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  Left ear tinnitus and pressure - Daily, intermittent non-pulsatile humming in the left ear, described as similar to the sound of a motherboard - Sensation of pressure in the left ear, particularly with head turning or when lying down - Symptoms confined to the left ear; no involvement of the right ear - No pulsatile tinnitus or sensation of heartbeat in the ear  Headache - Recent onset of mild, brief headaches over the past month - Headaches  are not severe and not temporally associated with ear symptoms - No severe or persistent headaches - Considering ophthalmologic evaluation for headaches  Cervical swelling - Prior history of cervical swelling, managed with self-massage - Previous CT imaging of the neck with contrast showed normal results - Expresses confusion regarding terminology in medical chart  Cardiovascular findings - Recent blood pressure measured in the 140s/84, mildly elevated, possibly due to white coat syndrome - No palpitations - No pacemaker present   Independent Review of Additional Tests or Records:  Reviewed external note from referring PCP, Hindel,describing relevant history incorporated into todays evaluation.   Previous audiogram. Hearing WNL AU. Word discrimination 100% AU. Type A tymp AU.     Personally reviewed and interpreted CT Neck with contrast - dominant left jugular bulb. No carotid abnormalities. No stones or masses in neck.  PMH/Meds/All/SocHx/FamHx/ROS:   Past Medical History:  Diagnosis Date   Arthritis    Back pain    Diabetes mellitus without complication (HCC)    GERD (gastroesophageal reflux disease)    Hyperlipidemia    Hypertension    Trigger finger of right hand    RMF, RRF     Past Surgical History:  Procedure Laterality Date   CARPAL TUNNEL RELEASE Right 03/10/2017   Procedure: CARPAL TUNNEL RELEASE;  Surgeon: Jerri Kay HERO, MD;  Location: [facility];  Service: Orthopedics;  Laterality: Right;   CARPOMETACARPEL SUSPENSION PLASTY Right 03/10/2017   Procedure: Right thumb Ligament Reconstruction Tendon Interposition, Right Carpal tunnel release;  Surgeon: Jerri Kay HERO, MD;  Location:  [facility];  Service: Orthopedics;  Laterality: Right;   JOINT REPLACEMENT     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   Procedure: RIGHT TOTAL KNEE ARTHROPLASTY;  Surgeon: Jerri Kay HERO, MD;  Location: [facility];  Service: Orthopedics;  Laterality: Right;    TRIGGER FINGER RELEASE Left 05/26/2017   Procedure: RELEASE TRIGGER FINGER LEFT THUMB;  Surgeon: Jerri Kay HERO, MD;  Location: [facility];  Service: Orthopedics;  Laterality: Left;   TRIGGER FINGER RELEASE Right 02/23/2018   Procedure: RELEASE RIGHT TRIGGER THUMB;  Surgeon: Jerri Kay HERO, MD;  Location: [facility];  Service: Orthopedics;  Laterality: Right;  Bier block   TRIGGER FINGER RELEASE Right 05/06/2022   Procedure: RIGHT MIDDLE & RING TRIGGER FINGER RELEASE;  Surgeon: Jerri Kay HERO, MD;  Location: [facility];  Service: Orthopedics;  Laterality: Right;   TUBAL LIGATION      Family History  Problem Relation Age of Onset   Diabetes Mother    Hypertension Mother    Heart failure Mother    Heart attack Father    Diabetes Sister    Dementia Brother    Hypertension Brother    Diabetes Brother    Hypertension Daughter    Sleep apnea Daughter    Colon cancer Neg Hx    Colon polyps Neg Hx    Heart disease Neg Hx    Rectal cancer Neg Hx    Stomach cancer Neg Hx      Social Connections: Unknown (04/14/2022)   Received from [facility]   Social Network    Social Network: Not on file      Current Outpatient Medications:    Accu-Chek FastClix Lancets MISC, Use to check blood sugar 3x per day. E11.9, Disp: 100 each, Rfl: 12   amlodipine -olmesartan  (AZOR ) 10-20 MG tablet, Take 1 tablet by mouth daily., Disp: 90 tablet, Rfl: 1   aspirin  EC 81 MG tablet, Take 1 tablet (81 mg total) by mouth daily., Disp: 30 tablet, Rfl: 3   Blood Glucose Monitoring Suppl (ACCU-CHEK GUIDE) w/Device KIT, 1 Device by Does not apply route in the morning and at bedtime., Disp: 1 kit, Rfl: 0   Continuous Glucose Sensor (FREESTYLE LIBRE 3 PLUS SENSOR) MISC, Change sensor every 15 days., Disp: 2 each, Rfl: 11   Dulaglutide  (TRULICITY ) 1.5 MG/0.5ML SOAJ, Inject 1.5 mg into the skin once a week., Disp: 2 mL, Rfl: 2   glucose blood (ACCU-CHEK GUIDE TEST) test strip, Use  to check blood sugar 3x per day. E11.9, Disp: 100 each, Rfl: 12   insulin  glargine (LANTUS ) 100 UNIT/ML Solostar Pen, Inject 10 Units into the skin every morning., Disp: , Rfl:    Insulin  Pen Needle (BD PEN NEEDLE NANO 2ND GEN) 32G X 4 MM MISC, 1 Units by Does not apply route 4 (four) times daily -  before meals and at bedtime., Disp: 100 each, Rfl: 2   metFORMIN  (GLUCOPHAGE ) 1000 MG tablet, Take 1 tablet (1,000 mg total) by mouth 2 (two) times daily., Disp: 180 tablet, Rfl: 3   pantoprazole  (PROTONIX ) 40 MG tablet, TAKE 1 TABLET BY MOUTH TWICE A DAY, Disp: 180 tablet, Rfl: 0   rosuvastatin  (CRESTOR ) 10 MG tablet, Take 1 tablet (10 mg total) by mouth daily., Disp: 90 tablet, Rfl: 3   terbinafine  (LAMISIL ) 250 MG tablet, Take 1 tablet (250 mg total) by mouth daily. (Patient not taking: Reported on 12/05/2024), Disp: 30 tablet, Rfl: 0   Physical Exam:  BP (!) 145/84 (BP Location: Left Arm, Patient Position: Sitting, Cuff Size: Normal)   Pulse 97   Wt 225 lb (102.1 kg)   LMP 06/23/2013   SpO2 92%   BMI 33.23 kg/m   The patient was awake, alert, and appropriate. The external ears were inspected, and otoscopy was performed to evaluate the external auditory canals and tympanic membranes. The nasal cavity and septum were examined for mucosal changes, obstruction, or discharge. The oral cavity and oropharynx were inspected for mucosal lesions, infection, or tonsillar hypertrophy. The neck was palpated for lymphadenopathy, thyroid abnormalities, or other masses. Cranial nerve function was grossly intact.  Pertinent Findings: Physical Exam HEENT: Normal oropharynx VITALS: BP- 140/84 HEENT: Oral cavity normal.Edentulous, normal SMG bilaterally    Seprately Identifiable Procedures:  I personally ordered, reviewed and interpreted the following with the patient today  Procedure: Bilateral ear microscopy using microscope (CPT 92504) Pre-procedure diagnosis: tinnitus Post-procedure diagnosis:  same Indication: see above; given patient's otologic complaints and history, for improved and comprehensive examination of external ear and tympanic membrane, bilateral otologic examination using microscope was performed. Prior to proceeding, verbal consent was obtained after discussion of R/B/A  Procedure: Patient was placed semi-recumbent. Both ear canals were examined using the microscope with findings below. Patient tolerated the procedure well.  Right ear:  No significant lesions pinna. EAC: no significant lesions. Canal is clear. Eczematoid changes. minimal TM: Intact   Left ear:  No significant lesions pinna. EAC: no significant lesions. Canal is clear. Eczematoid changes. minimal TM: Intact    Impression & Plans:  Melanie Phillips is a 59 y.o. female  1. Tinnitus of left ear   2. Sialoadenitis of submandibular gland    - Findings and diagnoses discussed in detail with the patient. - Risks, benefits, and alternatives were reviewed. Through shared decision making, the patient elects to proceed with below. Assessment & Plan  Left submandibular gland swelling Swelling of the left submandibular gland with possible stone or duct obstruction. - Grossly unremarkable CT neck - improved with massage, heat and sialologogues   Tinnitus with head movement Intermittent roaring sound in the left ear with head movement to the right. Normal hearing test. Etiology unclear. - history has changed slightly and this is now intermittently pulsatile - Symptoms likely attributed to dominant jugular bulb. - Ordered MRI for baseline  - Advised home blood pressure monitoring. - Provided reassurance about the benign nature of the dominant jugular bulb.  - Orders placed:  Orders Placed This Encounter  Procedures   MR BRAIN/IAC W WO CONTRAST   - Medications prescribed/continued/adjusted: No orders of the defined types were placed in this encounter.  - Education materials provided to the patient. -  Follow up: 6 weeks. Assess for vessel anomaly. Discuss further workup such as MRI. Patient instructed to return sooner or go to the ED if new/worsening symptoms develop.   Thank you for allowing me the opportunity to care for your patient. Please do not hesitate to contact me should you have any other questions.  Sincerely, Penne Croak, DO Otolaryngologist (ENT) [facility] Phone: [phone] Fax: [phone]  12/05/2024, 9:03 PM

## 2024-12-11 ENCOUNTER — Ambulatory Visit (HOSPITAL_COMMUNITY): Admission: RE | Admit: 2024-12-11 | Source: Ambulatory Visit

## 2024-12-13 ENCOUNTER — Ambulatory Visit (HOSPITAL_COMMUNITY)
Admission: RE | Admit: 2024-12-13 | Discharge: 2024-12-13 | Disposition: A | Discharge status: Home / Self Care | Source: Ambulatory Visit

## 2024-12-13 DIAGNOSIS — H9312 Tinnitus, left ear: Secondary | ICD-10-CM | POA: Diagnosis present

## 2024-12-13 MED ORDER — GADOBUTROL 1 MMOL/ML IV SOLN
10.0000 mL | Freq: Once | INTRAVENOUS | Status: AC | PRN
  Administered 2024-12-13: 10 mL via INTRAVENOUS

## 2024-12-21 ENCOUNTER — Telehealth: Payer: Self-pay | Admitting: Pharmacist

## 2024-12-21 NOTE — Telephone Encounter (Signed)
 Attempted to contact patient for follow-up of diabetes management. Patient reports she is only taking insulin  when she has a cheat meal. Shared with the patient that her Herlene readings from December were great, with GMI 5.9 and average glucose 110 mg/dL. Patient agreeable to replacing sensor and coming for follow-up appointment in early February.    Total time with patient call and documentation of interaction: 7 minutes.

## 2025-01-03 ENCOUNTER — Other Ambulatory Visit: Payer: Self-pay | Admitting: Family Medicine

## 2025-01-03 DIAGNOSIS — E119 Type 2 diabetes mellitus without complications: Secondary | ICD-10-CM

## 2025-01-04 ENCOUNTER — Encounter: Payer: Self-pay | Admitting: Pharmacist

## 2025-01-04 ENCOUNTER — Ambulatory Visit: Admitting: Pharmacist

## 2025-01-04 VITALS — BP 137/83 | HR 90 | Wt 225.6 lb

## 2025-01-04 DIAGNOSIS — E119 Type 2 diabetes mellitus without complications: Secondary | ICD-10-CM

## 2025-01-04 DIAGNOSIS — F17200 Nicotine dependence, unspecified, uncomplicated: Secondary | ICD-10-CM

## 2025-01-04 LAB — POCT GLYCOSYLATED HEMOGLOBIN (HGB A1C): HbA1c, POC (controlled diabetic range): 6.5 % (ref 0.0–7.0)

## 2025-01-04 MED ORDER — ROSUVASTATIN CALCIUM 10 MG PO TABS: 10.0000 mg | ORAL_TABLET | Freq: Every day | ORAL | 3 refills | Status: AC

## 2025-01-04 MED ORDER — FREESTYLE LIBRE 3 PLUS SENSOR MISC: 11 refills | Status: AC

## 2025-01-04 MED ORDER — TRULICITY 3 MG/0.5ML ~~LOC~~ SOAJ: 3.0000 mg | SUBCUTANEOUS | 4 refills | Status: AC

## 2025-01-04 NOTE — Assessment & Plan Note (Signed)
 Tobacco Use Disorder longstanding, moderate intake history who is near complete cessation as she is not smoking ~ 2-3 days per week.  Interested and willing to restart varenicline  to help with remaining intake cessation.  - Re-started with patient supply varenicline  0.5mg  daily for 3-5 days then 1mg  daily with food.  - Goal complete cessation in the next 30 days.

## 2025-01-04 NOTE — Assessment & Plan Note (Signed)
 Diabetes longstanding currently well controlled with last CGM data. Patient is able to verbalize appropriate hypoglycemia management plan. Medication adherence appears good.  - Discontinued basal insulin  Lantus  (insulin  glargine) 10 units PRN - Increased dose of GLP-1  Trulicity  (dulaglutide ) from 1.5 mg to 3 mg .  - Continued metformin  1000 mg BID.  - Patient educated on purpose, proper use, and potential adverse effects.  - Extensively discussed pathophysiology of diabetes, recommended lifestyle interventions, dietary effects on glucose control.  - Counseled on s/sx of and management of hypoglycemia.

## 2025-01-04 NOTE — Patient Instructions (Addendum)
 It was nice to see you today!  Your goal glucose value is 80-130 before eating and less than 180 after eating.  Medication Changes: START Trulicity  (dulaglutide ) 3.0 mg weekly after finishing current dose START Rosuvastatin  10 mg daily  STOP Lantus  (insulin  glargine)   Continue all other medication the same.   Keep up the good work with diet and exercise. Aim for a diet full of vegetables, fruit and lean meats (chicken, turkey, fish). Try to limit salt intake by eating fresh or frozen vegetables (instead of canned), rinse canned vegetables prior to cooking and do not add any additional salt to meals.   Monitor your glucose at home and keep a log (glucometer or piece of paper) to bring with you to your next visit.  Please bring all medications to your clinic visits.  Please arrive 10-15 minutes prior to your scheduled visit time.

## 2025-01-04 NOTE — Progress Notes (Signed)
 "   S:     Chief Complaint  Patient presents with   Medication Management    Diabetes follow-up    59 y.o. female who presents for diabetes evaluation, education, and management. Patient arrives in good spirits and presents without any assistance.  Patient was referred and last seen by Primary Care Provider, Dr. Somnang Mahan, on 08/08/2024.  At last visit, increased Trulicity  (dulaglutide ) dose to 1.5 mg.   PMH is significant for T2DM, hypertension, Obesity.  Patient reports Diabetes was diagnosed in 2015.   Family/Social History: Family history of DM. Smoking 7 days / 10 days - 4 cigarettes/day  Current diabetes medications include: Trulicity  (dulaglutide ) 1.5 mg weekly, Lantus  (insulin  glargine) 10 units PRN, metformin  1000 mg BID.  Current hypertension medications include: amlodipine  - olmesartan  10-20 mg dialy Current hyperlipidemia medications include: rosuvastatin  10 mg daily (taking as needed)  Patient reports not taking rosuvastatin  daily,  PRN when patient has fatty meal. not taking Lantus  (insulin  glargine) 10 units daily  only with carbohydrate.   Patient reports some hypoglycemic events.  O:   Review of Systems  All other systems reviewed and are negative.   Physical Exam Constitutional:      Appearance: Normal appearance.  Pulmonary:     Effort: Pulmonary effort is normal.  Neurological:     Mental Status: She is alert.  Psychiatric:        Mood and Affect: Mood normal.        Behavior: Behavior normal.        Thought Content: Thought content normal.     7 day average glucose: 102 mg/dL  Libre3 CGM Download today  % Time CGM is active: 97% Average Glucose: 102 mg/dL Glucose Management Indicator: 5.7  Glucose Variability: 26% (goal <36%) Time in Goal:  - Time in range 70-180: 95% - Time above range: 2% - Time below range: 3%   Lab Results  Component Value Date   HGBA1C 6.5 01/04/2025   Vitals:   01/04/25 1412  BP: 137/83  Pulse: 90  SpO2: 93%     Lipid Panel     Component Value Date/Time   CHOL 137 11/12/2020 1412   TRIG 144 11/12/2020 1412   HDL 50 11/12/2020 1412   CHOLHDL 2.7 11/12/2020 1412   CHOLHDL 5.1 08/11/2014 0005   VLDL 32 08/11/2014 0005   LDLCALC 62 11/12/2020 1412    Clinical Atherosclerotic Cardiovascular Disease (ASCVD): No  The ASCVD Risk score (Arnett DK, et al., 2019) failed to calculate for the following reasons:   Cannot find a previous HDL lab   Cannot find a previous total cholesterol lab   * - Cholesterol units were assumed  Lab Results  Component Value Date   CHOL 137 11/12/2020   HDL 50 11/12/2020   LDLCALC 62 11/12/2020   TRIG 144 11/12/2020   CHOLHDL 2.7 11/12/2020    Lab Results  Component Value Date   CREATININE 0.80 11/21/2024   BUN 10 03/15/2023   NA 140 03/15/2023   K 4.1 03/15/2023   CL 102 03/15/2023   CO2 19 (L) 03/15/2023    Medications Reviewed Today     Reviewed by Amari Burnsworth G, RPH-CPP (Pharmacist) on 01/04/25 at 1536  Med List Status: <None>   Medication Order Taking? Sig Documenting Provider Last Dose Status Informant  Accu-Chek FastClix Lancets MISC [id number]  Use to check blood sugar 3x per day. E11.9 Lupie Credit, DO  Active   amlodipine -olmesartan  (AZOR ) 10-20 MG tablet [id number] Yes Take  1 tablet by mouth daily. McDiarmid, Krystal BIRCH, MD  Active   aspirin  EC 81 MG tablet [id number] Yes Take 1 tablet (81 mg total) by mouth daily. Phelps, Jazma Y, DO  Active Self  Blood Glucose Monitoring Suppl (ACCU-CHEK GUIDE) w/Device KIT [id number]  1 Device by Does not apply route in the morning and at bedtime. Lupie Credit, DO  Active   Continuous Glucose Sensor (FREESTYLE LIBRE 3 PLUS SENSOR) MISC [id number]  Change sensor every 15 days. Rumball, Alison M, DO  Active    Discontinued 01/04/25 1433 (Dose change)   Dulaglutide  (TRULICITY ) 3 MG/0.5ML SOAJ [id number] Yes Inject 3 mg into the skin once a week. Rumball, Alison M, DO  Active   glucose blood (ACCU-CHEK GUIDE TEST) test  strip [id number]  Use to check blood sugar 3x per day. E11.9 Lupie Credit, DO  Active   insulin  glargine (LANTUS ) 100 UNIT/ML Solostar Pen [id number] Yes Inject 10 Units into the skin every morning.  Patient taking differently: Inject 10 Units into the skin every morning. Only when patient has meal   Hindel, Leah, MD  Active   Insulin  Pen Needle (BD PEN NEEDLE NANO 2ND GEN) 32G X 4 MM MISC [id number]  1 Units by Does not apply route 4 (four) times daily -  before meals and at bedtime. Brimage, Vondra, DO  Active Self  metFORMIN  (GLUCOPHAGE ) 1000 MG tablet [id number] Yes Take 1 tablet (1,000 mg total) by mouth 2 (two) times daily. McDiarmid, Krystal BIRCH, MD  Active   pantoprazole  (PROTONIX ) 40 MG tablet [id number] Yes TAKE 1 TABLET BY MOUTH TWICE A DAY Stacia Glendia BRAVO, MD  Active   rosuvastatin  (CRESTOR ) 10 MG tablet [id number]  Take 1 tablet (10 mg total) by mouth daily. Rumball, Alison M, DO  Active   terbinafine  (LAMISIL ) 250 MG tablet [id number]  Take 1 tablet (250 mg total) by mouth daily.  Patient not taking: Reported on 01/04/2025   Christine Rush, DPM  Active               Patient is participating in a Managed Medicaid Plan:  Yes   A/P: Diabetes longstanding currently well controlled with last CGM data. Patient is able to verbalize appropriate hypoglycemia management plan. Medication adherence appears good.  - Discontinued basal insulin  Lantus  (insulin  glargine) 10 units PRN - Increased dose of GLP-1  Trulicity  (dulaglutide ) from 1.5 mg to 3 mg .  - Continued metformin  1000 mg BID.  - Patient educated on purpose, proper use, and potential adverse effects.  - Extensively discussed pathophysiology of diabetes, recommended lifestyle interventions, dietary effects on glucose control.  - Counseled on s/sx of and management of hypoglycemia.  - A1c tested today.   ASCVD risk - Primary prevention in patient with diabetes. Last LDL 62 mg/dl is at goal of <29 mg/dL in 7978. Not taking rosuvastatin   10 mg daily. Patient reports she takes rosuvastatin  when she eats fatty meal. - Restarted rosuvastatin  10 mg daily.   - Lipid Panel in > 4 weeks after more adherent with statin.   Hypertension longstanding currently well controlled. Blood pressure goal of <130/80 mmHg. Medication adherence appears good.  - Continued amlodipine -olmesartan  10-20 mg daily.  Tobacco Use Disorder longstanding, moderate intake history who is near complete cessation as she is not smoking ~ 2-3 days per week.  Interested and willing to restart varenicline  to help with remaining intake cessation.  - Re-started with patient supply varenicline  0.5mg  daily for 3-5 days then 1mg  daily with food.  -  Goal complete cessation in the next 30 days.    Written patient instructions provided. Patient verbalized understanding of treatment plan.  Total time in face to face counseling 27 minutes.    Follow-up:  Pharmacist visit 02/20/25 PCP clinic visit 02/06/25 Patient seen with Sabra Schuller, PharmD Candidate - PY2 student and Fredia Main, PharmD Candidate - PY4 student.    "

## 2025-01-05 LAB — BASIC METABOLIC PANEL WITH GFR
BUN/Creatinine Ratio: 18 (ref 12–28)
BUN: 12 mg/dL (ref 8–27)
CO2: 23 mmol/L (ref 20–29)
Calcium: 9.5 mg/dL (ref 8.7–10.3)
Chloride: 102 mmol/L (ref 96–106)
Creatinine, Ser: 0.68 mg/dL (ref 0.57–1.00)
Glucose: 91 mg/dL (ref 70–99)
Potassium: 4.4 mmol/L (ref 3.5–5.2)
Sodium: 142 mmol/L (ref 134–144)
eGFR: 100 mL/min/{1.73_m2}

## 2025-01-05 LAB — MICROALBUMIN / CREATININE URINE RATIO
Creatinine, Urine: 174.5 mg/dL
Microalb/Creat Ratio: 14 mg/g{creat} (ref 0–29)
Microalbumin, Urine: 25 ug/mL

## 2025-01-05 NOTE — Progress Notes (Signed)
 Reviewed and agree with Dr Rennis plan.

## 2025-02-06 ENCOUNTER — Ambulatory Visit: Payer: Self-pay | Admitting: Family Medicine

## 2025-02-13 ENCOUNTER — Ambulatory Visit (INDEPENDENT_AMBULATORY_CARE_PROVIDER_SITE_OTHER)

## 2025-02-20 ENCOUNTER — Ambulatory Visit: Admitting: Pharmacist
